# Patient Record
Sex: Female | Born: 1995 | Race: Black or African American | Hispanic: No | Marital: Married | Smoking: Current every day smoker
Health system: Southern US, Community
[De-identification: ages and names within clinical notes are randomized; demographics above are authoritative.]

## PROBLEM LIST (undated history)

## (undated) ENCOUNTER — Inpatient Hospital Stay (HOSPITAL_COMMUNITY): Payer: Self-pay

## (undated) DIAGNOSIS — B009 Herpesviral infection, unspecified: Secondary | ICD-10-CM

## (undated) DIAGNOSIS — A749 Chlamydial infection, unspecified: Secondary | ICD-10-CM

## (undated) DIAGNOSIS — E119 Type 2 diabetes mellitus without complications: Secondary | ICD-10-CM

## (undated) HISTORY — PX: NO PAST SURGERIES: SHX2092

## (undated) HISTORY — DX: Herpesviral infection, unspecified: B00.9

---

## 2016-08-13 ENCOUNTER — Encounter (HOSPITAL_COMMUNITY): Payer: Self-pay

## 2016-08-13 ENCOUNTER — Emergency Department (HOSPITAL_COMMUNITY)
Admission: EM | Admit: 2016-08-13 | Discharge: 2016-08-13 | Disposition: A | Discharge status: Home / Self Care | Payer: Managed Care, Other (non HMO) | Attending: Emergency Medicine | Admitting: Emergency Medicine

## 2016-08-13 ENCOUNTER — Emergency Department (HOSPITAL_COMMUNITY): Payer: Managed Care, Other (non HMO)

## 2016-08-13 DIAGNOSIS — E1165 Type 2 diabetes mellitus with hyperglycemia: Secondary | ICD-10-CM | POA: Diagnosis not present

## 2016-08-13 DIAGNOSIS — R739 Hyperglycemia, unspecified: Secondary | ICD-10-CM

## 2016-08-13 DIAGNOSIS — R0789 Other chest pain: Secondary | ICD-10-CM | POA: Insufficient documentation

## 2016-08-13 HISTORY — DX: Type 2 diabetes mellitus without complications: E11.9

## 2016-08-13 LAB — BASIC METABOLIC PANEL
Anion gap: 10 (ref 5–15)
BUN: 9 mg/dL (ref 6–20)
CO2: 21 mmol/L — ABNORMAL LOW (ref 22–32)
Calcium: 8.8 mg/dL — ABNORMAL LOW (ref 8.9–10.3)
Chloride: 101 mmol/L (ref 101–111)
Creatinine, Ser: 0.79 mg/dL (ref 0.44–1.00)
GFR calc Af Amer: >60 mL/min (ref >60)
GFR calc non Af Amer: >60 mL/min (ref >60)
Glucose, Bld: 407 mg/dL — ABNORMAL HIGH (ref 65–99)
Potassium: 4 mmol/L (ref 3.5–5.1)
Sodium: 132 mmol/L — ABNORMAL LOW (ref 135–145)

## 2016-08-13 LAB — CBC
HCT: 39.4 % (ref 36.0–46.0)
Hemoglobin: 12.9 g/dL (ref 12.0–15.0)
MCH: 28.2 pg (ref 26.0–34.0)
MCHC: 32.7 g/dL (ref 30.0–36.0)
MCV: 86 fL (ref 78.0–100.0)
Platelets: 367 10*3/uL (ref 150–400)
RBC: 4.58 MIL/uL (ref 3.87–5.11)
RDW: 12.1 % (ref 11.5–15.5)
WBC: 7 10*3/uL (ref 4.0–10.5)

## 2016-08-13 LAB — I-STAT TROPONIN, ED: Troponin i, poc: 0.01 ng/mL (ref 0.00–0.08)

## 2016-08-13 LAB — CBG MONITORING, ED: Glucose-Capillary: 123 mg/dL — ABNORMAL HIGH (ref 65–99)

## 2016-08-13 MED ORDER — INSULIN ASPART 100 UNIT/ML ~~LOC~~ SOLN
10.0000 [IU] | Freq: Once | SUBCUTANEOUS | Status: AC
  Administered 2016-08-13: 10 [IU] via INTRAVENOUS
  Filled 2016-08-13: qty 1

## 2016-08-13 MED ORDER — SODIUM CHLORIDE 0.9 % IV BOLUS (SEPSIS)
1000.0000 mL | Freq: Once | INTRAVENOUS | Status: AC
  Administered 2016-08-13: 1000 mL via INTRAVENOUS

## 2016-08-13 NOTE — ED Notes (Addendum)
CBG is 123 

## 2016-08-13 NOTE — ED Triage Notes (Signed)
Pt states that central chest pressure started about an hour ago along with some SOB, denies n/v or radiation.

## 2016-08-13 NOTE — ED Provider Notes (Signed)
[facility]-EMERGENCY DEPT Provider Note   CSN: [id number] Arrival date & time: 08/13/16  0008  By signing my name below, I, Sandrea Hammond, attest that this documentation has been prepared under the direction and in the presence of Gilda Crease, MD. Electronically Signed: Sandrea Hammond, ED Scribe. 08/13/16. 2:04 AM.   History   Chief Complaint Chief Complaint  Patient presents with  . Chest Pain    HPI Comments: Jill Montoya is a 20 y.o. female who presents to the Emergency Department complaining of chest pain that feels like pressure since yesterday. She says she experienced these pain symptoms yesterday and then again tonight. Pt says the pain is exacerbating by laying back in a supine position. She says the pain is alleviated by sitting up into an upright position. She says she has had experienced no previous episodes of this pain before yesterday. Per triage note, pt had a serum glucose of 407 mg/dL on arrival. She says she is noncompliant on her blood sugar medications. Pt denies cough, abdominal pain, vomiting, reflux symptoms, or hx of ulcer or recent illness. Pt says she has no other symptoms or complaints at this time.   The history is provided by the patient and a relative. No language interpreter was used.    Past Medical History:  Diagnosis Date  . Diabetes mellitus without complication (HCC)     There are no active problems to display for this patient.   History reviewed. No pertinent surgical history.  OB History    No data available       Home Medications    Prior to Admission medications   Not on File    Family History No family history on file.  Social History Social History  Substance Use Topics  . Smoking status: Never Smoker  . Smokeless tobacco: Never Used  . Alcohol use No     Allergies   Review of patient's allergies indicates no known allergies.   Review of Systems Review of Systems  Respiratory: Negative for cough.     Cardiovascular: Positive for chest pain.  Gastrointestinal: Negative for abdominal pain and vomiting.  All other systems reviewed and are negative.    Physical Exam Updated Vital Signs BP 109/70 (BP Location: Left Arm)   Pulse 76   Temp 98.6 F (37 C)   Resp 16   Ht 5\' 4"  (1.626 m)   Wt 186 lb (84.4 kg)   SpO2 100%   BMI 31.93 kg/m   Physical Exam  Constitutional: She is oriented to person, place, and time. She appears well-developed and well-nourished. No distress.  HENT:  Head: Normocephalic and atraumatic.  Right Ear: Hearing normal.  Left Ear: Hearing normal.  Nose: Nose normal.  Mouth/Throat: Oropharynx is clear and moist and mucous membranes are normal.  Eyes: Conjunctivae and EOM are normal. Pupils are equal, round, and reactive to light.  Neck: Normal range of motion. Neck supple.  Cardiovascular: Regular rhythm, S1 normal and S2 normal.  Exam reveals no gallop and no friction rub.   No murmur heard. Pulmonary/Chest: Effort normal and breath sounds normal. No respiratory distress. She exhibits no tenderness.  Abdominal: Soft. Normal appearance and bowel sounds are normal. There is no hepatosplenomegaly. There is no tenderness. There is no rebound, no guarding, no tenderness at McBurney's point and negative Murphy's sign. No hernia.  Musculoskeletal: Normal range of motion.  Neurological: She is alert and oriented to person, place, and time. She has normal strength. No cranial nerve deficit or  sensory deficit. Coordination normal. GCS eye subscore is 4. GCS verbal subscore is 5. GCS motor subscore is 6.  Skin: Skin is warm, dry and intact. No rash noted. No cyanosis.  Psychiatric: She has a normal mood and affect. Her speech is normal and behavior is normal. Thought content normal.  Nursing note and vitals reviewed.    ED Treatments / Results   DIAGNOSTIC STUDIES: Oxygen Saturation is 98% on RA, adequate by my interpretation.    COORDINATION OF CARE: 1:53 AM  Discussed treatment plan with pt at bedside and pt agreed to plan. Long-term risks associated with uncontrolled blood sugars were discussed.   Labs (all labs ordered are listed, but only abnormal results are displayed) Labs Reviewed  BASIC METABOLIC PANEL - Abnormal; Notable for the following:       Result Value   Sodium 132 (*)    CO2 21 (*)    Glucose, Bld 407 (*)    Calcium 8.8 (*)    All other components within normal limits  CBG MONITORING, ED - Abnormal; Notable for the following:    Glucose-Capillary 123 (*)    All other components within normal limits  CBC  I-STAT TROPOININ, ED    EKG  EKG Interpretation None       Radiology Dg Chest 2 View  Result Date: 08/13/2016 CLINICAL DATA:  20 year old female with chest pain EXAM: CHEST  2 VIEW COMPARISON:  None. FINDINGS: The heart size and mediastinal contours are within normal limits. Both lungs are clear. The visualized skeletal structures are unremarkable. IMPRESSION: No active cardiopulmonary disease. Electronically Signed   By: Elgie Collard M.D.   On: 08/13/2016 02:06    Procedures Procedures (including critical care time)  Medications Ordered in ED Medications  sodium chloride 0.9 % bolus 1,000 mL (0 mLs Intravenous Stopped 08/13/16 0310)  insulin aspart (novoLOG) injection 10 Units (10 Units Intravenous Given 08/13/16 0226)     Initial Impression / Assessment and Plan / ED Course  I have reviewed the triage vital signs and the nursing notes.  Pertinent labs & imaging results that were available during my care of the patient were reviewed by me and considered in my medical decision making (see chart for details).  Clinical Course    Patient presented to the emergency department with complaints of chest pain. Patient reports that she's been having a heaviness across her upper chest since yesterday. Patient reports that the symptoms were present when she lies flat to go away when she sits up. She does not have  any cardiac risk factors other than diabetes. Symptoms are extremely atypical. They have been present throughout the course of the day and there are no EKG changes and troponin was negative.  Patient was found to be very hyperglycemic. She admits that she has not been taking her diabetes medications regularly. Bicarbonate was slightly low at 21 but she does not have an anion gap. Patient appears well. She was hydrated with IV fluids and administer insulin. Recheck of blood sugar is now normal. No concern for DKA, appropriate for discharge. Patient counseled that she needs to take better control of her diabetes going forward.  Final Clinical Impressions(s) / ED Diagnoses   Final diagnoses:  Atypical chest pain  Hyperglycemia    New Prescriptions New Prescriptions   No medications on file   I personally performed the services described in this documentation, which was scribed in my presence. The recorded information has been reviewed and is accurate.  Gilda Creasehristopher J Hazel Leveille, MD 08/13/16 [phone]

## 2016-08-13 NOTE — ED Notes (Signed)
MD at bedside. 

## 2017-01-14 ENCOUNTER — Encounter (HOSPITAL_COMMUNITY): Payer: Self-pay

## 2017-01-14 DIAGNOSIS — J111 Influenza due to unidentified influenza virus with other respiratory manifestations: Secondary | ICD-10-CM | POA: Insufficient documentation

## 2017-01-14 DIAGNOSIS — E119 Type 2 diabetes mellitus without complications: Secondary | ICD-10-CM | POA: Insufficient documentation

## 2017-01-14 DIAGNOSIS — R05 Cough: Secondary | ICD-10-CM | POA: Diagnosis present

## 2017-01-14 NOTE — ED Triage Notes (Addendum)
Pt reports body aches, chills, fever, dry cough, and nausea. She went to urgent care two days ago bu they ran out of flu swabs but gave her Tamiflu but she still feels bad. She also reports nausea and vomiting. She also reports having abdominal pain earlier today but not right now.

## 2017-01-14 NOTE — ED Notes (Signed)
Pts acuity increased to "3" due to HR of 115

## 2017-01-15 ENCOUNTER — Emergency Department (HOSPITAL_COMMUNITY)
Admission: EM | Admit: 2017-01-15 | Discharge: 2017-01-15 | Disposition: A | Discharge status: Home / Self Care | Payer: 59 | Attending: Emergency Medicine | Admitting: Emergency Medicine

## 2017-01-15 ENCOUNTER — Emergency Department (HOSPITAL_COMMUNITY): Payer: 59

## 2017-01-15 DIAGNOSIS — R69 Illness, unspecified: Secondary | ICD-10-CM

## 2017-01-15 DIAGNOSIS — J111 Influenza due to unidentified influenza virus with other respiratory manifestations: Secondary | ICD-10-CM

## 2017-01-15 LAB — COMPREHENSIVE METABOLIC PANEL
ALT: 16 U/L (ref 14–54)
AST: 17 U/L (ref 15–41)
Albumin: 3.8 g/dL (ref 3.5–5.0)
Alkaline Phosphatase: 58 U/L (ref 38–126)
Anion gap: 10 (ref 5–15)
BUN: 7 mg/dL (ref 6–20)
CO2: 25 mmol/L (ref 22–32)
Calcium: 8.8 mg/dL — ABNORMAL LOW (ref 8.9–10.3)
Chloride: 101 mmol/L (ref 101–111)
Creatinine, Ser: 0.79 mg/dL (ref 0.44–1.00)
GFR calc Af Amer: >60 mL/min (ref >60)
GFR calc non Af Amer: >60 mL/min (ref >60)
Glucose, Bld: 175 mg/dL — ABNORMAL HIGH (ref 65–99)
Potassium: 3.5 mmol/L (ref 3.5–5.1)
Sodium: 136 mmol/L (ref 135–145)
Total Bilirubin: 1.2 mg/dL (ref 0.3–1.2)
Total Protein: 6.8 g/dL (ref 6.5–8.1)

## 2017-01-15 LAB — CBC WITH DIFFERENTIAL/PLATELET
Basophils Absolute: 0 10*3/uL (ref 0.0–0.1)
Basophils Relative: 0 %
Eosinophils Absolute: 0 10*3/uL (ref 0.0–0.7)
Eosinophils Relative: 1 %
HCT: 39.1 % (ref 36.0–46.0)
Hemoglobin: 13.1 g/dL (ref 12.0–15.0)
Lymphocytes Relative: 33 %
Lymphs Abs: 1.2 10*3/uL (ref 0.7–4.0)
MCH: 28.7 pg (ref 26.0–34.0)
MCHC: 33.5 g/dL (ref 30.0–36.0)
MCV: 85.7 fL (ref 78.0–100.0)
Monocytes Absolute: 0.5 10*3/uL (ref 0.1–1.0)
Monocytes Relative: 14 %
Neutro Abs: 1.8 10*3/uL (ref 1.7–7.7)
Neutrophils Relative %: 52 %
Platelets: 261 10*3/uL (ref 150–400)
RBC: 4.56 MIL/uL (ref 3.87–5.11)
RDW: 12.3 % (ref 11.5–15.5)
WBC: 3.5 10*3/uL — ABNORMAL LOW (ref 4.0–10.5)

## 2017-01-15 LAB — I-STAT BETA HCG BLOOD, ED (MC, WL, AP ONLY): I-stat hCG, quantitative: <5 m[IU]/mL (ref <5)

## 2017-01-15 LAB — LIPASE, BLOOD: Lipase: 27 U/L (ref 11–51)

## 2017-01-15 LAB — I-STAT CG4 LACTIC ACID, ED: Lactic Acid, Venous: 0.91 mmol/L (ref 0.5–1.9)

## 2017-01-15 MED ORDER — SODIUM CHLORIDE 0.9 % IV BOLUS (SEPSIS): 2000.0000 mL | Freq: Once | INTRAVENOUS | Status: DC

## 2017-01-15 MED ORDER — SODIUM CHLORIDE 0.9 % IV BOLUS (SEPSIS)
2000.0000 mL | Freq: Once | INTRAVENOUS | Status: AC
  Administered 2017-01-15: 2000 mL via INTRAVENOUS

## 2017-01-15 MED ORDER — BENZONATATE 100 MG PO CAPS: 100.0000 mg | ORAL_CAPSULE | Freq: Three times a day (TID) | ORAL | 0 refills | Status: DC

## 2017-01-15 MED ORDER — ONDANSETRON HCL 4 MG/2ML IJ SOLN
4.0000 mg | Freq: Once | INTRAMUSCULAR | Status: AC
  Administered 2017-01-15: 4 mg via INTRAVENOUS
  Filled 2017-01-15: qty 2

## 2017-01-15 MED ORDER — KETOROLAC TROMETHAMINE 30 MG/ML IJ SOLN
30.0000 mg | Freq: Once | INTRAMUSCULAR | Status: AC
  Administered 2017-01-15: 30 mg via INTRAVENOUS
  Filled 2017-01-15: qty 1

## 2017-01-15 NOTE — Discharge Instructions (Signed)
Return to the emergency room for any worsening or concerning symptoms including fast breathing, heart racing, confusion, vomiting. ° °Rest, cover your mouth when you cough and wash your hands frequently.  ° °Push fluids: water or Gatorade, do not drink any soda, juice or caffeinated beverages. ° °For fever and pain control you can take Motrin (ibuprofen) as follows: 400 mg (this is normally 2 over the counter pills) every 4 hours with food. ° °Do not return to work until a day after your fever breaks.  ° ° ° °

## 2017-01-15 NOTE — ED Provider Notes (Signed)
[facility] Provider Note   CSN: [id number] Arrival date & time: 01/14/17  2123     History   Chief Complaint Chief Complaint  Patient presents with  . Generalized Body Aches     HPI Blood pressure 112/85, pulse 115, temperature 99.8 F (37.7 C), temperature source Oral, resp. rate 18, SpO2 100 %.  Jill Montoya is a 21 y.o. female with past medical history significant for non-insulin-dependent diabetes complaining of productive cough, myalgia, fever that has been difficult to control with associated nausea and vomiting, no diarrhea or decreased urinary output. She was seen at her primary care office and diagnosed with clinical flu and pneumonia. She's was started on Tamiflu and azithromycin 2 days ago and states that she does not feel better. She's been taking Robitussin and acetaminophen at home with little relief.  Past Medical History:  Diagnosis Date  . Diabetes mellitus without complication (HCC)     There are no active problems to display for this patient.   History reviewed. No pertinent surgical history.  OB History    No data available       Home Medications    Prior to Admission medications   Medication Sig Start Date End Date Taking? Authorizing Provider  benzonatate (TESSALON) 100 MG capsule Take 1 capsule (100 mg total) by mouth every 8 (eight) hours. 01/15/17   Joni Reining Pema Thomure, PA-C    Family History No family history on file.  Social History Social History  Substance Use Topics  . Smoking status: Never Smoker  . Smokeless tobacco: Never Used  . Alcohol use No     Allergies   Nasonex [mometasone furoate]   Review of Systems Review of Systems  10 systems reviewed and found to be negative, except as noted in the HPI.  Physical Exam Updated Vital Signs BP 115/68   Pulse 96   Temp 99.1 F (37.3 C) (Oral)   Resp 14   Ht 5\' 4"  (1.626 m)   Wt 83.3 kg   SpO2 99%   BMI 31.51 kg/m   Physical Exam  Constitutional: She is  oriented to person, place, and time. She appears well-developed and well-nourished. No distress.  HENT:  Head: Normocephalic and atraumatic.  Right Ear: External ear normal.  Left Ear: External ear normal.  Mouth/Throat: Oropharynx is clear and moist. No oropharyngeal exudate.  No drooling or stridor. Posterior pharynx mildly erythematous no significant tonsillar hypertrophy. No exudate. Soft palate rises symmetrically. No TTP or induration under tongue.   No tenderness to palpation of frontal or bilateral maxillary sinuses.  Mild mucosal edema in the nares with scant rhinorrhea.  Bilateral tympanic membranes with normal architecture and good light reflex.    Eyes: Conjunctivae and EOM are normal. Pupils are equal, round, and reactive to light.  Neck: Normal range of motion. Neck supple.  Cardiovascular: Normal rate, regular rhythm and intact distal pulses.   Pulmonary/Chest: Effort normal and breath sounds normal. No stridor. No respiratory distress. She has no wheezes. She has no rales. She exhibits no tenderness.  Abdominal: Soft. She exhibits no distension and no mass. There is no tenderness. There is no rebound and no guarding. No hernia.  Musculoskeletal: Normal range of motion.  Neurological: She is alert and oriented to person, place, and time.  Skin: Capillary refill takes less than 2 seconds. She is not diaphoretic.  Psychiatric: She has a normal mood and affect.  Nursing note and vitals reviewed.    ED Treatments / Results  Labs (all  labs ordered are listed, but only abnormal results are displayed) Labs Reviewed  CBC WITH DIFFERENTIAL/PLATELET - Abnormal; Notable for the following:       Result Value   WBC 3.5 (*)    All other components within normal limits  COMPREHENSIVE METABOLIC PANEL - Abnormal; Notable for the following:    Glucose, Bld 175 (*)    Calcium 8.8 (*)    All other components within normal limits  LIPASE, BLOOD  I-STAT CG4 LACTIC ACID, ED  I-STAT  BETA HCG BLOOD, ED ([facility], [facility], [facility] ONLY)  I-STAT CG4 LACTIC ACID, ED    EKG  EKG Interpretation None       Radiology Dg Chest 2 View  Result Date: 01/15/2017 CLINICAL DATA:  Fever, chills, nausea, vomiting, dizziness, cough, congestion for 4 days. History of diabetes. EXAM: CHEST  2 VIEW COMPARISON:  Chest radiograph August 13, 2016 FINDINGS: Cardiomediastinal silhouette is normal. No pleural effusions or focal consolidations. Trachea projects midline and there is no pneumothorax. Soft tissue planes and included osseous structures are non-suspicious. IMPRESSION: Normal chest. Electronically Signed   By: Awilda Metroourtnay  Bloomer M.D.   On: 01/15/2017 02:27    Procedures Procedures (including critical care time)  Medications Ordered in ED Medications  sodium chloride 0.9 % bolus 2,000 mL (not administered)  sodium chloride 0.9 % bolus 2,000 mL (0 mLs Intravenous Stopped 01/15/17 0448)  ondansetron (ZOFRAN) injection 4 mg (4 mg Intravenous Given 01/15/17 0216)  ketorolac (TORADOL) 30 MG/ML injection 30 mg (30 mg Intravenous Given 01/15/17 0330)     Initial Impression / Assessment and Plan / ED Course  I have reviewed the triage vital signs and the nursing notes.  Pertinent labs & imaging results that were available during my care of the patient were reviewed by me and considered in my medical decision making (see chart for details).     Vitals:   01/15/17 0400 01/15/17 0415 01/15/17 0427 01/15/17 0430  BP: 115/73 115/77  115/68  Pulse: 111 92  96  Resp: 11 25  14   Temp:   99.1 F (37.3 C)   TempSrc:   Oral   SpO2: 99% 98%  99%  Weight:      Height:        Medications  sodium chloride 0.9 % bolus 2,000 mL (not administered)  sodium chloride 0.9 % bolus 2,000 mL (0 mLs Intravenous Stopped 01/15/17 0448)  ondansetron (ZOFRAN) injection 4 mg (4 mg Intravenous Given 01/15/17 0216)  ketorolac (TORADOL) 30 MG/ML injection 30 mg (30 mg Intravenous Given 01/15/17 0330)    Jill Montoya is 21  y.o. female non-insulin-dependent diabetic recently diagnosed with both son pneumonia and flu she been feeling ill for approximately 5 days. States that she feels that the Tamiflu and azithromycin are helpful to her, initially patient is quite tachycardic and blood pressure is soft with systolic in the mid 80s. Patient is overall well appearing, will obtain chest x-ray, check basic blood work and give to liter bolus and reassess.  Blood work and chest x-ray reassuring. Tachycardia has resolved, she feels much better after hydration and Toradol. Advised her to continue taking her Tamiflu and check in with primary care for recheck in one week.  Evaluation does not show pathology that would require ongoing emergent intervention or inpatient treatment. Pt is hemodynamically stable and mentating appropriately. Discussed findings and plan with patient/guardian, who agrees with care plan. All questions answered. Return precautions discussed and outpatient follow up given.    Final Clinical  Impressions(s) / ED Diagnoses   Final diagnoses:  Influenza-like illness    New Prescriptions New Prescriptions   BENZONATATE (TESSALON) 100 MG CAPSULE    Take 1 capsule (100 mg total) by mouth every 8 (eight) hours.     Wynetta Emery, PA-C 01/15/17 0452    Layla Maw Ward, DO 01/15/17 [id number]

## 2017-01-15 NOTE — ED Notes (Signed)
Patient transported to X-ray 

## 2017-09-23 DIAGNOSIS — Z3009 Encounter for other general counseling and advice on contraception: Secondary | ICD-10-CM | POA: Diagnosis not present

## 2017-09-23 DIAGNOSIS — E109 Type 1 diabetes mellitus without complications: Secondary | ICD-10-CM | POA: Diagnosis not present

## 2017-09-23 DIAGNOSIS — N760 Acute vaginitis: Secondary | ICD-10-CM | POA: Diagnosis not present

## 2017-09-23 DIAGNOSIS — Z124 Encounter for screening for malignant neoplasm of cervix: Secondary | ICD-10-CM | POA: Diagnosis not present

## 2017-09-30 DIAGNOSIS — Z6829 Body mass index (BMI) 29.0-29.9, adult: Secondary | ICD-10-CM | POA: Diagnosis not present

## 2017-09-30 DIAGNOSIS — E1165 Type 2 diabetes mellitus with hyperglycemia: Secondary | ICD-10-CM | POA: Diagnosis not present

## 2017-10-27 DIAGNOSIS — H17822 Peripheral opacity of cornea, left eye: Secondary | ICD-10-CM | POA: Diagnosis not present

## 2017-10-27 DIAGNOSIS — H16142 Punctate keratitis, left eye: Secondary | ICD-10-CM | POA: Diagnosis not present

## 2017-11-16 NOTE — L&D Delivery Note (Signed)
Delivery Note At 7:03 PM a viable female was delivered via Vaginal, Spontaneous (Presentation: left occiput anterior ;  ).  APGAR: 9, 9; weight 6 lb 8.8 oz (2970 g).   Placenta status: complete 3 vessel , .  Cord:  with the following complications: none.  Cord pH: NA  Anesthesia: epidural   Episiotomy: None Lacerations: 2nd degree;Perineal Suture Repair: 3.0 vicryl Est. Blood Loss (mL): 50  Mom to postpartum.  Baby to NICU.  [name] J. 07/09/2018, 11:35 PM

## 2017-12-19 ENCOUNTER — Emergency Department (HOSPITAL_COMMUNITY)
Admission: EM | Admit: 2017-12-19 | Discharge: 2017-12-19 | Disposition: A | Discharge status: Home / Self Care | Payer: 59 | Attending: Emergency Medicine | Admitting: Emergency Medicine

## 2017-12-19 ENCOUNTER — Emergency Department (HOSPITAL_COMMUNITY): Payer: 59

## 2017-12-19 ENCOUNTER — Other Ambulatory Visit: Payer: Self-pay

## 2017-12-19 ENCOUNTER — Encounter (HOSPITAL_COMMUNITY): Payer: Self-pay

## 2017-12-19 DIAGNOSIS — Z3A01 Less than 8 weeks gestation of pregnancy: Secondary | ICD-10-CM | POA: Insufficient documentation

## 2017-12-19 DIAGNOSIS — O9989 Other specified diseases and conditions complicating pregnancy, childbirth and the puerperium: Secondary | ICD-10-CM | POA: Diagnosis present

## 2017-12-19 DIAGNOSIS — R102 Pelvic and perineal pain: Secondary | ICD-10-CM | POA: Insufficient documentation

## 2017-12-19 DIAGNOSIS — R1030 Lower abdominal pain, unspecified: Secondary | ICD-10-CM | POA: Diagnosis not present

## 2017-12-19 DIAGNOSIS — O26891 Other specified pregnancy related conditions, first trimester: Secondary | ICD-10-CM | POA: Diagnosis not present

## 2017-12-19 DIAGNOSIS — O208 Other hemorrhage in early pregnancy: Secondary | ICD-10-CM | POA: Diagnosis not present

## 2017-12-19 DIAGNOSIS — E119 Type 2 diabetes mellitus without complications: Secondary | ICD-10-CM | POA: Insufficient documentation

## 2017-12-19 DIAGNOSIS — R1084 Generalized abdominal pain: Secondary | ICD-10-CM

## 2017-12-19 LAB — COMPREHENSIVE METABOLIC PANEL
ALT: 12 U/L — ABNORMAL LOW (ref 14–54)
AST: 18 U/L (ref 15–41)
Albumin: 3.7 g/dL (ref 3.5–5.0)
Alkaline Phosphatase: 54 U/L (ref 38–126)
Anion gap: 9 (ref 5–15)
BUN: 9 mg/dL (ref 6–20)
CO2: 20 mmol/L — ABNORMAL LOW (ref 22–32)
Calcium: 8.6 mg/dL — ABNORMAL LOW (ref 8.9–10.3)
Chloride: 104 mmol/L (ref 101–111)
Creatinine, Ser: 0.78 mg/dL (ref 0.44–1.00)
GFR calc Af Amer: >60 mL/min (ref >60)
GFR calc non Af Amer: >60 mL/min (ref >60)
Glucose, Bld: 190 mg/dL — ABNORMAL HIGH (ref 65–99)
Potassium: 4.3 mmol/L (ref 3.5–5.1)
Sodium: 133 mmol/L — ABNORMAL LOW (ref 135–145)
Total Bilirubin: 0.6 mg/dL (ref 0.3–1.2)
Total Protein: 6.3 g/dL — ABNORMAL LOW (ref 6.5–8.1)

## 2017-12-19 LAB — URINALYSIS, ROUTINE W REFLEX MICROSCOPIC
Bilirubin Urine: NEGATIVE
Glucose, UA: >=500 mg/dL — AB
Hgb urine dipstick: NEGATIVE
Ketones, ur: NEGATIVE mg/dL
Leukocytes, UA: NEGATIVE
Nitrite: NEGATIVE
Protein, ur: NEGATIVE mg/dL
Specific Gravity, Urine: 1.018 (ref 1.005–1.030)
pH: 7 (ref 5.0–8.0)

## 2017-12-19 LAB — HCG, QUANTITATIVE, PREGNANCY: hCG, Beta Chain, Quant, S: 6765 m[IU]/mL — ABNORMAL HIGH (ref <5)

## 2017-12-19 LAB — LIPASE, BLOOD: Lipase: 28 U/L (ref 11–51)

## 2017-12-19 LAB — CBC
HCT: 36.8 % (ref 36.0–46.0)
Hemoglobin: 12.4 g/dL (ref 12.0–15.0)
MCH: 29.5 pg (ref 26.0–34.0)
MCHC: 33.7 g/dL (ref 30.0–36.0)
MCV: 87.4 fL (ref 78.0–100.0)
Platelets: 346 10*3/uL (ref 150–400)
RBC: 4.21 MIL/uL (ref 3.87–5.11)
RDW: 12.6 % (ref 11.5–15.5)
WBC: 6.2 10*3/uL (ref 4.0–10.5)

## 2017-12-19 LAB — I-STAT BETA HCG BLOOD, ED (MC, WL, AP ONLY): I-stat hCG, quantitative: >2000 m[IU]/mL — ABNORMAL HIGH (ref <5)

## 2017-12-19 LAB — HCG, SERUM, QUALITATIVE: Preg, Serum: POSITIVE — AB

## 2017-12-19 NOTE — ED Notes (Signed)
Patient transported to Ultrasound 

## 2017-12-19 NOTE — ED Triage Notes (Signed)
Patient complains of generalized abdominal discomfort and frequent urination x 1 day. No discharge, no bleeding. Reports that she is [redacted] weeks pregnant, G1

## 2017-12-19 NOTE — ED Provider Notes (Signed)
[facility] EMERGENCY DEPARTMENT Provider Note   CSN: [id number] Arrival date & time: 12/19/17  1141     History   Chief Complaint No chief complaint on file.   HPI Jill Montoya is a G30P0 22 y.o. female who is currently [redacted] weeks pregnant  (LMP 11/15/17) with a history of diabetes who presents emergency department today for abdominal pain. Patient notes that yesterday evening she had some crampy abdominal pains in the suprapubic area that lasted for approximately 1 hour.  She also notes she had intermittent episodes of sharp lower quadrant pain that lasted for approximately 30 seconds.  She contacted her OB who told this was likely normal pain.  She states she is very nervous that this is her first pregnancy.  Pain is not recurred.  She does not she had some epigastric pressure this morning that lasted approximately 1 hour and describes this as dull and achy.  She has not taken anything for her symptoms.  Nothing makes her symptoms better or worse.  She is currently without any abdominal pain.  Denies any fever, chills, nausea/vomiting, vaginal discharge, vaginal bleeding, pelvic pain.  Patient does note some increased urinary frequency but notes this is due to a new diabetic medication she recently started.  She denies any hematuria, urgency, dysuria, flank pain.  HPI  Past Medical History:  Diagnosis Date  . Diabetes mellitus without complication (HCC)     There are no active problems to display for this patient.   History reviewed. No pertinent surgical history.  OB History    Gravida Para Term Preterm AB Living   1             SAB TAB Ectopic Multiple Live Births                   Home Medications    Prior to Admission medications   Medication Sig Start Date End Date Taking? Authorizing Provider  benzonatate (TESSALON) 100 MG capsule Take 1 capsule (100 mg total) by mouth every 8 (eight) hours. 01/15/17   Pisciotta, Mardella Layman    Family History No  family history on file.  Social History Social History   Tobacco Use  . Smoking status: Never Smoker  . Smokeless tobacco: Never Used  Substance Use Topics  . Alcohol use: No  . Drug use: Not on file     Allergies   Nasonex [mometasone furoate]   Review of Systems Review of Systems  All other systems reviewed and are negative.    Physical Exam Updated Vital Signs BP 120/66 (BP Location: Right Arm)   Pulse 87   Temp 99.2 F (37.3 C) (Oral)   Resp 12   Ht 5\' 4"  (1.626 m)   Wt 80.3 kg (177 lb)   LMP 11/15/2017   SpO2 99%   BMI 30.38 kg/m   Physical Exam  Constitutional: She appears well-developed and well-nourished.  HENT:  Head: Normocephalic and atraumatic.  Right Ear: External ear normal.  Left Ear: External ear normal.  Nose: Nose normal.  Mouth/Throat: Uvula is midline, oropharynx is clear and moist and mucous membranes are normal. No tonsillar exudate.  Eyes: Pupils are equal, round, and reactive to light. Right eye exhibits no discharge. Left eye exhibits no discharge. No scleral icterus.  Neck: Trachea normal. Neck supple. No spinous process tenderness present. No neck rigidity. Normal range of motion present.  Cardiovascular: Normal rate, regular rhythm and intact distal pulses.  No murmur heard. Pulses:  Radial pulses are 2+ on the right side, and 2+ on the left side.       Dorsalis pedis pulses are 2+ on the right side, and 2+ on the left side.       Posterior tibial pulses are 2+ on the right side, and 2+ on the left side.  No lower extremity swelling or edema. Calves symmetric in size bilaterally.  Pulmonary/Chest: Effort normal and breath sounds normal. She exhibits no tenderness.  Abdominal: Soft. Bowel sounds are normal. She exhibits no distension. There is no tenderness. There is no rigidity, no rebound, no guarding and no CVA tenderness.  Mildly gravid abdomen.  Musculoskeletal: She exhibits no edema.  Lymphadenopathy:    She has no  cervical adenopathy.  Neurological: She is alert.  Skin: Skin is warm and dry. No rash noted. She is not diaphoretic.  Psychiatric: She has a normal mood and affect.  Nursing note and vitals reviewed.    ED Treatments / Results  Labs (all labs ordered are listed, but only abnormal results are displayed) Labs Reviewed  COMPREHENSIVE METABOLIC PANEL - Abnormal; Notable for the following components:      Result Value   Sodium 133 (*)    CO2 20 (*)    Glucose, Bld 190 (*)    Calcium 8.6 (*)    Total Protein 6.3 (*)    ALT 12 (*)    All other components within normal limits  URINALYSIS, ROUTINE W REFLEX MICROSCOPIC - Abnormal; Notable for the following components:   Glucose, UA >=500 (*)    Bacteria, UA RARE (*)    Squamous Epithelial / LPF 0-5 (*)    All other components within normal limits  I-STAT BETA HCG BLOOD, ED ([facility], [facility], [facility] ONLY) - Abnormal; Notable for the following components:   I-stat hCG, quantitative >2,000.0 (*)    All other components within normal limits  LIPASE, BLOOD  CBC  HCG, QUANTITATIVE, PREGNANCY    EKG  EKG Interpretation None       Radiology US Ob Comp < 14 Wks  Result Date: 12/19/2017 CLINICAL DATA:  Pelvic pain x2 days, pregnant EXAM: OBSTETRIC <14 WK [place] AND TRANSVAGINAL OB US TECHNIQUE: Both transabdominal and transvaginal ultrasound examinations were performed for complete evaluation of the gestation as well as the maternal uterus, adnexal regions, and pelvic cul-de-sac. Transvaginal technique was performed to assess early pregnancy. COMPARISON:  None. FINDINGS: Intrauterine gestational sac: Single Yolk sac:  Visualized. Embryo:  Not Visualized. MSD: 7.5 mm   5 w   3  d Subchorionic hemorrhage:  Small subchronic hemorrhage. Maternal uterus/adnexae: Right ovary is within normal limits. Left ovary is notable for a corpus luteal cyst. Small volume pelvic ascites. IMPRESSION: Single intrauterine gestational sac with yolk sac, measuring 5 weeks 3 days by  mean sac diameter. No definite fetal pole is visualized. Consider follow-up pelvic ultrasound in 14 days to document viability, as clinically warranted. Electronically Signed   By: Charline Bills M.D.   On: 12/19/2017 15:14   US Ob Transvaginal  Result Date: 12/19/2017 CLINICAL DATA:  Pelvic pain x2 days, pregnant EXAM: OBSTETRIC <14 WK [place] AND TRANSVAGINAL OB US TECHNIQUE: Both transabdominal and transvaginal ultrasound examinations were performed for complete evaluation of the gestation as well as the maternal uterus, adnexal regions, and pelvic cul-de-sac. Transvaginal technique was performed to assess early pregnancy. COMPARISON:  None. FINDINGS: Intrauterine gestational sac: Single Yolk sac:  Visualized. Embryo:  Not Visualized. MSD: 7.5 mm   5 w  3  d Subchorionic hemorrhage:  Small subchronic hemorrhage. Maternal uterus/adnexae: Right ovary is within normal limits. Left ovary is notable for a corpus luteal cyst. Small volume pelvic ascites. IMPRESSION: Single intrauterine gestational sac with yolk sac, measuring 5 weeks 3 days by mean sac diameter. No definite fetal pole is visualized. Consider follow-up pelvic ultrasound in 14 days to document viability, as clinically warranted. Electronically Signed   By: Charline BillsSriyesh  Krishnan M.D.   On: 12/19/2017 15:14    Procedures Procedures (including critical care time)  Medications Ordered in ED Medications - No data to display   Initial Impression / Assessment and Plan / ED Course  I have reviewed the triage vital signs and the nursing notes.  Pertinent labs & imaging results that were available during my care of the patient were reviewed by me and considered in my medical decision making (see chart for details).     Is a 22 year old G1 P0 female who is currently [redacted] weeks pregnant presenting with abdominal pain that began yesterday.  This is been intermittent and changing in location since onset.  There is no associated fever, chills, nausea,  emesis, vaginal bleeding, vaginal discharge or pelvic pain.  Patient is currently without any abdominal pain.  Abdomen is soft, nondistended and without tenderness palpation or peritoneal signs.  She does report some increased urinary frequency but denies any dysuria, urgency, or hematuria.  UA is without evidence of infection.  Labs otherwise reassuring.  No leukocytosis.  Lipase within normal limits.  No anemia.  Will order transvaginal ultrasound to evaluate pain. Will order a quantitative hCG.   Ultrasound shows single intrauterine gestational sac with yolk sac, measuring 5 weeks 3 days.  Do not suspect ectopic pregnancy at this time.  The evaluation does not show pathology that would require ongoing emergent intervention or inpatient treatment.  Will have patient follow-up with OB/GYN in 2 days for repeat hCG testing and reevaluation. I advised the patient to return to the emergency department with new or worsening symptoms or new concerns. Specific return precautions discussed. The patient verbalized understanding and agreement with plan. All questions answered. No further questions at this time. The patient is hemodynamically stable, mentating appropriately and appears safe for discharge.  Patient case discussed with Dr. Fayrene FearingJames who is in agreement with plan.  Final Clinical Impressions(s) / ED Diagnoses   Final diagnoses:  Less than [redacted] weeks gestation of pregnancy  Generalized abdominal pain    ED Discharge Orders    None       Princella PellegriniMaczis, Hayden Kihara M, PA-C 12/19/17 1555    Rolland PorterJames, Mark, MD 12/20/17 2157

## 2017-12-19 NOTE — Discharge Instructions (Addendum)
Your lab testing and ultrasound were reassuring today.  You will need to follow-up with your OB/GYN 2 days for repeat blood testing (HcG).  If you  develop any increasing abdominal pain, vaginal bleeding or vaginal discharge you can return to the emergency department for further evaluation. If you develop worsening or new concerning symptoms you can return to the emergency department for re-evaluation.

## 2017-12-21 DIAGNOSIS — O200 Threatened abortion: Secondary | ICD-10-CM | POA: Diagnosis not present

## 2018-01-03 DIAGNOSIS — Z3400 Encounter for supervision of normal first pregnancy, unspecified trimester: Secondary | ICD-10-CM | POA: Diagnosis not present

## 2018-01-03 DIAGNOSIS — Z3401 Encounter for supervision of normal first pregnancy, first trimester: Secondary | ICD-10-CM | POA: Diagnosis not present

## 2018-01-03 LAB — OB RESULTS CONSOLE RPR: RPR: NONREACTIVE

## 2018-01-03 LAB — OB RESULTS CONSOLE HIV ANTIBODY (ROUTINE TESTING): HIV: NONREACTIVE

## 2018-01-03 LAB — OB RESULTS CONSOLE ABO/RH: RH Type: POSITIVE

## 2018-01-03 LAB — OB RESULTS CONSOLE RUBELLA ANTIBODY, IGM: Rubella: IMMUNE

## 2018-01-03 LAB — OB RESULTS CONSOLE HEPATITIS B SURFACE ANTIGEN: Hepatitis B Surface Ag: NEGATIVE

## 2018-02-01 DIAGNOSIS — O24011 Pre-existing diabetes mellitus, type 1, in pregnancy, first trimester: Secondary | ICD-10-CM | POA: Diagnosis not present

## 2018-02-02 DIAGNOSIS — Z3687 Encounter for antenatal screening for uncertain dates: Secondary | ICD-10-CM | POA: Diagnosis not present

## 2018-02-02 DIAGNOSIS — Z3A11 11 weeks gestation of pregnancy: Secondary | ICD-10-CM | POA: Diagnosis not present

## 2018-02-25 DIAGNOSIS — Z8619 Personal history of other infectious and parasitic diseases: Secondary | ICD-10-CM | POA: Diagnosis not present

## 2018-03-24 DIAGNOSIS — O0992 Supervision of high risk pregnancy, unspecified, second trimester: Secondary | ICD-10-CM | POA: Diagnosis not present

## 2018-03-24 DIAGNOSIS — O24112 Pre-existing diabetes mellitus, type 2, in pregnancy, second trimester: Secondary | ICD-10-CM | POA: Diagnosis not present

## 2018-03-25 ENCOUNTER — Other Ambulatory Visit (HOSPITAL_COMMUNITY): Payer: Self-pay | Admitting: Obstetrics & Gynecology

## 2018-03-25 DIAGNOSIS — O24112 Pre-existing diabetes mellitus, type 2, in pregnancy, second trimester: Secondary | ICD-10-CM

## 2018-03-25 DIAGNOSIS — Z3689 Encounter for other specified antenatal screening: Secondary | ICD-10-CM

## 2018-03-25 DIAGNOSIS — Z3A20 20 weeks gestation of pregnancy: Secondary | ICD-10-CM

## 2018-04-06 ENCOUNTER — Encounter (HOSPITAL_COMMUNITY): Payer: Self-pay | Admitting: *Deleted

## 2018-04-07 ENCOUNTER — Ambulatory Visit (HOSPITAL_COMMUNITY)
Admission: RE | Admit: 2018-04-07 | Discharge: 2018-04-07 | Disposition: A | Discharge status: Home / Self Care | Payer: 59 | Source: Ambulatory Visit | Attending: Obstetrics & Gynecology | Admitting: Obstetrics & Gynecology

## 2018-04-07 ENCOUNTER — Other Ambulatory Visit (HOSPITAL_COMMUNITY): Payer: Self-pay | Admitting: *Deleted

## 2018-04-07 ENCOUNTER — Encounter (HOSPITAL_COMMUNITY): Payer: Self-pay

## 2018-04-07 DIAGNOSIS — Z363 Encounter for antenatal screening for malformations: Secondary | ICD-10-CM | POA: Insufficient documentation

## 2018-04-07 DIAGNOSIS — O99212 Obesity complicating pregnancy, second trimester: Secondary | ICD-10-CM | POA: Insufficient documentation

## 2018-04-07 DIAGNOSIS — Z3A20 20 weeks gestation of pregnancy: Secondary | ICD-10-CM | POA: Diagnosis not present

## 2018-04-07 DIAGNOSIS — O24919 Unspecified diabetes mellitus in pregnancy, unspecified trimester: Secondary | ICD-10-CM | POA: Insufficient documentation

## 2018-04-07 DIAGNOSIS — O24112 Pre-existing diabetes mellitus, type 2, in pregnancy, second trimester: Secondary | ICD-10-CM | POA: Diagnosis present

## 2018-04-07 DIAGNOSIS — O24012 Pre-existing diabetes mellitus, type 1, in pregnancy, second trimester: Secondary | ICD-10-CM | POA: Diagnosis not present

## 2018-04-07 DIAGNOSIS — E119 Type 2 diabetes mellitus without complications: Secondary | ICD-10-CM

## 2018-04-07 DIAGNOSIS — O24819 Other pre-existing diabetes mellitus in pregnancy, unspecified trimester: Secondary | ICD-10-CM

## 2018-04-07 DIAGNOSIS — Z3689 Encounter for other specified antenatal screening: Secondary | ICD-10-CM

## 2018-04-07 HISTORY — DX: Chlamydial infection, unspecified: A74.9

## 2018-04-07 LAB — HEMOGLOBIN A1C
Hgb A1c MFr Bld: 5.2 % (ref 4.8–5.6)
Mean Plasma Glucose: 102.54 mg/dL

## 2018-04-07 NOTE — Consult Note (Signed)
Maternal Fetal Medicine Consultation  Requesting Provider(s): Ozan  Primary OB: Ozan Reason for consultation: Pre-existing diabetes in pregnancy  HPI: 21yo P0 at 20+3 weeks with a 4 year history of diabetes. 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 Since diagnosis she has never had DKA. She has seen a an ophthalmologist within the last 6 months and has no evidence of retinopathy. She was previously managed on dulaglutide but this was stopped by Dr. Charlotta Newton and glyburide 2.5mg  BID was begun, pending an appointment with a [facility] endocrinologist, Dr. Everardo All. The patient reports that she has experienced worsening of her blood sugar levels since starting glyburide; she did not bring her glucose logs, but dates her fasting this morning was 140. This is consistent with oral hypoglycemic effects on LADA. She does not remember when she last had a HgbA1C. My review of the EMR shows a level of 7.8% in January of 2015 The pregnancy course has been completely benign so far.  OB History: OB History    Gravida  1   Para      Term      Preterm      AB      Living  0     SAB      TAB      Ectopic      Multiple      Live Births              PMH:  Past Medical History:  Diagnosis Date  . Chlamydia   . Diabetes mellitus without complication (HCC)     PSH:  Past Surgical History:  Procedure Laterality Date  . NO PAST SURGERIES     Meds: PNV, Glyburide Allergies: Nasonex FH: Father has type 1 DM, mother has type 2 DM Soc: denies tobacco, alcohol and illicit drugs  Review of Systems: no vaginal bleeding or cramping/contractions, no LOF, no nausea/vomiting. All other systems reviewed and are negative.  PE:  VS: BP    111/64        Pulse 93       Weight 193 lb GEN: well-appearing female ABD: gravid, NT  Please see separate document for fetal ultrasound report.  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 We will, of course, defer glycemic management to Dr. Everardo All. The patient understands our philosophy of near-euglycemia during pregnancy to minimize poor obstetrical outcomes. She has a good understanding of her disease. She may have had some compliance issues in the past but appears motivated to attempt strict control. I have taken the liberty of scheduling a fetal echocardiogram. He initial anatomic survey is normal, with fetal profile and aortic arch images being less-than-optimal. We will repeat a scan in 4 weeks. The patient needs serial growth scans every 4 weeks after that, and needs antepartum testing from 30-32 weeks onward.  Thank you for the opportunity to be a part of the care of Jill Montoya. Please contact our office if we can be of further assistance.   I spent approximately 30 minutes with this patient with over 50% of time spent in face-to-face counseling.

## 2018-04-10 ENCOUNTER — Emergency Department (HOSPITAL_COMMUNITY)
Admission: EM | Admit: 2018-04-10 | Discharge: 2018-04-10 | Disposition: A | Discharge status: Home / Self Care | Payer: 59 | Source: Home / Self Care

## 2018-04-10 ENCOUNTER — Encounter (HOSPITAL_COMMUNITY): Payer: Self-pay | Admitting: *Deleted

## 2018-04-10 ENCOUNTER — Inpatient Hospital Stay (HOSPITAL_COMMUNITY)
Admission: AD | Admit: 2018-04-10 | Discharge: 2018-04-10 | Disposition: A | Discharge status: Home / Self Care | Payer: 59 | Source: Ambulatory Visit | Attending: Obstetrics and Gynecology | Admitting: Obstetrics and Gynecology

## 2018-04-10 DIAGNOSIS — Z3A20 20 weeks gestation of pregnancy: Secondary | ICD-10-CM | POA: Insufficient documentation

## 2018-04-10 DIAGNOSIS — B372 Candidiasis of skin and nail: Secondary | ICD-10-CM | POA: Insufficient documentation

## 2018-04-10 DIAGNOSIS — O26892 Other specified pregnancy related conditions, second trimester: Secondary | ICD-10-CM | POA: Diagnosis present

## 2018-04-10 DIAGNOSIS — O98812 Other maternal infectious and parasitic diseases complicating pregnancy, second trimester: Secondary | ICD-10-CM | POA: Insufficient documentation

## 2018-04-10 DIAGNOSIS — R109 Unspecified abdominal pain: Secondary | ICD-10-CM | POA: Diagnosis present

## 2018-04-10 LAB — WET PREP, GENITAL
Clue Cells Wet Prep HPF POC: NONE SEEN
Sperm: NONE SEEN
Trich, Wet Prep: NONE SEEN
Yeast Wet Prep HPF POC: NONE SEEN

## 2018-04-10 LAB — URINALYSIS, ROUTINE W REFLEX MICROSCOPIC
Bilirubin Urine: NEGATIVE
Glucose, UA: 150 mg/dL — AB
Hgb urine dipstick: NEGATIVE
Ketones, ur: NEGATIVE mg/dL
Leukocytes, UA: NEGATIVE
Nitrite: NEGATIVE
Protein, ur: NEGATIVE mg/dL
Specific Gravity, Urine: 1.015 (ref 1.005–1.030)
pH: 8 (ref 5.0–8.0)

## 2018-04-10 MED ORDER — NYSTATIN 100000 UNIT/GM EX CREA: TOPICAL_CREAM | CUTANEOUS | 0 refills | Status: DC

## 2018-04-10 NOTE — MAU Provider Note (Signed)
History     CSN: [id number]  Arrival date and time: 04/10/18 1156   First Provider Initiated Contact with Patient 04/10/18 1402      Chief Complaint  Patient presents with  . Abdominal Pain   HPI  Ms.  Jill Montoya is a 22 y.o. year old G1P0 female at [redacted]w[redacted]d weeks gestation who presents to MAU reporting lower abdominal cramping x hour before coming in. She denies any abdominal cramping now. She denies VB, abnormal vaginal d/c, itching vaginal odor or burning.    Past Medical History:  Diagnosis Date  . Chlamydia   . Diabetes mellitus without complication [facility])     Past Surgical History:  Procedure Laterality Date  . NO PAST SURGERIES      Family History  Problem Relation Age of Onset  . Diabetes Mother   . Diabetes Father     Social History   Tobacco Use  . Smoking status: Never Smoker  . Smokeless tobacco: Never Used  Substance Use Topics  . Alcohol use: No  . Drug use: Never    Allergies:  Allergies  Allergen Reactions  . Nasonex [Mometasone Furoate]     "My nasal passages close up"    Medications Prior to Admission  Medication Sig Dispense Refill Last Dose  . benzonatate (TESSALON) 100 MG capsule Take 1 capsule (100 mg total) by mouth every 8 (eight) hours. (Patient not taking: Reported on 04/07/2018) 21 capsule 0 Not Taking  . GLYBURIDE PO Take by mouth.   Taking    Review of Systems  Constitutional: Negative.   HENT: Negative.   Eyes: Negative.   Respiratory: Negative.   Cardiovascular: Negative.   Gastrointestinal: Negative.   Endocrine: Negative.   Genitourinary: Positive for pelvic pain (lower abdominal cramping).  Musculoskeletal: Negative.   Skin: Negative.   Allergic/Immunologic: Negative.   Neurological: Negative.   Hematological: Negative.   Psychiatric/Behavioral: Negative.    Physical Exam   Blood pressure 131/75, pulse 86, temperature 98.5 F (36.9 C), resp. rate 18, weight 196 lb (88.9 kg), last menstrual period  11/15/2017.  Physical Exam  Nursing note and vitals reviewed. Constitutional: She is oriented to person, place, and time. She appears well-developed and well-nourished.  HENT:  Head: Normocephalic and atraumatic.  Eyes: Pupils are equal, round, and reactive to light.  Neck: Normal range of motion.  Cardiovascular: Normal rate, regular rhythm and normal heart sounds.  Respiratory: Effort normal and breath sounds normal.  GI: Soft. Bowel sounds are normal.  Genitourinary:     Genitourinary Comments: Uterus: gravid, S=D, SE: cervix is smooth, pink, no lesions, moderate amt of thick, white vaginal d/c -- WP, GC/CT done, closed/long/firm, no CMT or friability, no adnexal tenderness // labial and perineal skin very pinkish red, thickened with white adherent coating -- pt has no complaints, but suspicious for cutaneous candidiasis  Musculoskeletal: Normal range of motion.  Neurological: She is alert and oriented to person, place, and time.  Skin: Skin is warm and dry.  Psychiatric: She has a normal mood and affect. Her behavior is normal. Judgment and thought content normal.    MAU Course  Procedures  MDM CCUA Wet prep FHTs by doppler: 156 bpm   *Consult with Dr. Richardson Dopp @ 1530 - notified of patient's complaints, assessments, lab & U/S results, tx plan Rx Nystatin cream for cutaneous candidiasis - ok to d/c home, agrees with plan   Results for orders placed or performed during the hospital encounter of 04/10/18 (from the past 24 hour(s))  Urinalysis, Routine w reflex microscopic     Status: Abnormal   Collection Time: 04/10/18 12:16 PM  Result Value Ref Range   Color, Urine YELLOW YELLOW   APPearance CLEAR CLEAR   Specific Gravity, Urine 1.015 1.005 - 1.030   pH 8.0 5.0 - 8.0   Glucose, UA 150 (A) NEGATIVE mg/dL   Hgb urine dipstick NEGATIVE NEGATIVE   Bilirubin Urine NEGATIVE NEGATIVE   Ketones, ur NEGATIVE NEGATIVE mg/dL   Protein, ur NEGATIVE NEGATIVE mg/dL   Nitrite NEGATIVE  NEGATIVE   Leukocytes, UA NEGATIVE NEGATIVE  Wet prep, genital     Status: Abnormal   Collection Time: 04/10/18  2:00 PM  Result Value Ref Range   Yeast Wet Prep HPF POC NONE SEEN NONE SEEN   Trich, Wet Prep NONE SEEN NONE SEEN   Clue Cells Wet Prep HPF POC NONE SEEN NONE SEEN   WBC, Wet Prep HPF POC FEW (A) NONE SEEN   Sperm NONE SEEN     Assessment and Plan  Cutaneous candidiasis  - Rx for Nystatin cream to affected area BID  - F/U with [facility] OB[facility]N as scheduled - Discharge patient - Patient verbalized an understanding of the plan of care and agrees.    Raelyn Mora, MSN, CNM 04/10/2018, 2:03 PM

## 2018-04-10 NOTE — Discharge Instructions (Signed)

## 2018-04-10 NOTE — MAU Note (Signed)
Pt presents to MAU with complaints of abdominal cramping for an hour. Denies any VB or abnormal discharge

## 2018-04-10 NOTE — ED Notes (Signed)
Pt mother states they got a call back from pt doctor who told them to go to [facility] hospital.

## 2018-04-12 LAB — GC/CHLAMYDIA PROBE AMP (~~LOC~~) NOT AT ARMC
Chlamydia: NEGATIVE
Neisseria Gonorrhea: NEGATIVE

## 2018-04-14 ENCOUNTER — Encounter: Payer: Self-pay | Admitting: Endocrinology

## 2018-04-14 ENCOUNTER — Ambulatory Visit (INDEPENDENT_AMBULATORY_CARE_PROVIDER_SITE_OTHER): Payer: 59 | Admitting: Endocrinology

## 2018-04-14 VITALS — BP 110/60 | HR 110 | Ht 63.5 in | Wt 197.0 lb

## 2018-04-14 DIAGNOSIS — O24819 Other pre-existing diabetes mellitus in pregnancy, unspecified trimester: Secondary | ICD-10-CM | POA: Diagnosis not present

## 2018-04-14 LAB — TSH: TSH: 1.7 u[IU]/mL (ref 0.35–4.50)

## 2018-04-14 MED ORDER — METFORMIN HCL ER 500 MG PO TB24: 2000.0000 mg | ORAL_TABLET | Freq: Every day | ORAL | 11 refills | Status: DC

## 2018-04-14 NOTE — Progress Notes (Signed)
Subjective:    Patient ID: Jill Montoya, female    DOB: 02-05-1996, 22 y.o.   MRN: [id number]  HPI pt is referred by Dr Richardson Dopp, for diabetes.  Pt states DM was dx'ed in 2015; she has mild if any neuropathy of the lower extremities; she is unaware of any associated chronic complications; she has never been on insulin; pt says her diet and exercise are good; she has never had pancreatitis, pancreatic surgery, severe hypoglycemia or DKA.  She is now at 25 weeks.  She has gained 15 lbs with this pregnancy. 2 weeks ago, she changed trulicity to glyburide 2.5 mg QAM.  She says cbg varies from 80-140. It is highest after lunch.   Past Medical History:  Diagnosis Date  . Chlamydia   . Diabetes mellitus without complication [facility])     Past Surgical History:  Procedure Laterality Date  . NO PAST SURGERIES      Social History   Socioeconomic History  . Marital status: Single    Spouse name: Not on file  . Number of children: Not on file  . Years of education: Not on file  . Highest education level: Not on file  Occupational History  . Not on file  Social Needs  . Financial resource strain: Not on file  . Food insecurity:    Worry: Not on file    Inability: Not on file  . Transportation needs:    Medical: Not on file    Non-medical: Not on file  Tobacco Use  . Smoking status: Never Smoker  . Smokeless tobacco: Never Used  Substance and Sexual Activity  . Alcohol use: No  . Drug use: Never  . Sexual activity: Yes  Lifestyle  . Physical activity:    Days per week: Not on file    Minutes per session: Not on file  . Stress: Not on file  Relationships  . Social connections:    Talks on phone: Not on file    Gets together: Not on file    Attends religious service: Not on file    Active member of club or organization: Not on file    Attends meetings of clubs or organizations: Not on file    Relationship status: Not on file  . Intimate partner violence:    Fear of current or ex  partner: Not on file    Emotionally abused: Not on file    Physically abused: Not on file    Forced sexual activity: Not on file  Other Topics Concern  . Not on file  Social History Narrative  . Not on file    Current Outpatient Medications on File Prior to Visit  Medication Sig Dispense Refill  . nystatin cream (MYCOSTATIN) Apply to affected area 2 times daily 30 g 0   No current facility-administered medications on file prior to visit.     Allergies  Allergen Reactions  . Nasonex [Mometasone Furoate]     "My nasal passages close up"    Family History  Problem Relation Age of Onset  . Diabetes Mother   . Diabetes Father     BP 110/60 (BP Location: Left Arm, Patient Position: Sitting, Cuff Size: Normal)   Pulse (!) 110   Ht 5' 3.5" (1.613 m)   Wt 197 lb (89.4 kg)   LMP 11/15/2017 (Exact Date)   SpO2 98%   BMI 34.35 kg/m    Review of Systems denies fatigue, blurry vision, headache, chest pain, sob, nocturia, muscle cramps,  excessive diaphoresis, depression, cold intolerance, rhinorrhea, and easy bruising.  Nausea is improving.       Objective:   Physical Exam VS: see vs page GEN: no distress HEAD: head: no deformity eyes: no periorbital swelling, no proptosis external nose and ears are normal mouth: no lesion seen NECK: supple, thyroid is not enlarged CHEST WALL: no deformity LUNGS: clear to auscultation CV: reg rate and rhythm, no murmur ABD: abdomen is soft, nontender.  no hepatosplenomegaly.  not distended.  no hernia MUSCULOSKELETAL: muscle bulk and strength are grossly normal.  no obvious joint swelling.  gait is normal and steady EXTEMITIES: no deformity.  no ulcer on the feet.  feet are of normal color and temp.  no edema PULSES: dorsalis pedis intact bilat.  no carotid bruit NEURO:  cn 2-12 grossly intact.   readily moves all 4's.  sensation is intact to touch on the feet.   SKIN:  Normal texture and temperature.  No rash or suspicious lesion is  visible.   NODES:  None palpable at the neck PSYCH: alert, well-oriented.  Does not appear anxious nor depressed.  Lab Results  Component Value Date   HGBA1C 5.2 04/07/2018   I have reviewed outside records, and summarized: Pt was noted to have DM affecting pregnancy, and referred here.  Pt was not sensing fetal movement.  [place] is planned soon     Assessment & Plan:  Type 2 DM: well-controlled.  In my opinion, she is OK to continue oral rx for now.   Pregnancy: she needs aggressive glycemic control.   Patient Instructions  A thyroid blood test is requested for you today.  We'll let you know about the results.   good diet and exercise significantly improve the control of your diabetes.  please let me know if you wish to be referred to a dietician.  high blood sugar is very risky to your health.  you should see an eye doctor and dentist every year.  It is very important to get all recommended vaccinations.  check your blood sugar 4 times a day.  vary the time of day when you check, between before meals, after meals, and at bedtime.  also check if you have symptoms of your blood sugar being too high or too low.  please keep a record of the readings and bring it to your next appointment here (or you can bring the meter itself).  You can write it on any piece of paper.  please call us sooner if your blood sugar goes below 70, or if you have a lot of readings over 200.   I have sent a prescription to your pharmacy, to change glyburide to metformin.  Please call or message [place] next week, to tell us how the blood sugar is doing.  Please come back for a follow-up appointment in 4-6 weeks.

## 2018-04-14 NOTE — Patient Instructions (Addendum)
A thyroid blood test is requested for you today.  We'll let you know about the results.   good diet and exercise significantly improve the control of your diabetes.  please let me know if you wish to be referred to a dietician.  high blood sugar is very risky to your health.  you should see an eye doctor and dentist every year.  It is very important to get all recommended vaccinations.  check your blood sugar 4 times a day.  vary the time of day when you check, between before meals, after meals, and at bedtime.  also check if you have symptoms of your blood sugar being too high or too low.  please keep a record of the readings and bring it to your next appointment here (or you can bring the meter itself).  You can write it on any piece of paper.  please call us sooner if your blood sugar goes below 70, or if you have a lot of readings over 200.   I have sent a prescription to your pharmacy, to change glyburide to metformin.  Please call or message [place] next week, to tell us how the blood sugar is doing.  Please come back for a follow-up appointment in 4-6 weeks.

## 2018-05-03 ENCOUNTER — Other Ambulatory Visit (HOSPITAL_COMMUNITY): Payer: Self-pay | Admitting: *Deleted

## 2018-05-03 ENCOUNTER — Encounter (HOSPITAL_COMMUNITY): Payer: Self-pay

## 2018-05-03 ENCOUNTER — Other Ambulatory Visit (HOSPITAL_COMMUNITY): Payer: Self-pay | Admitting: Obstetrics and Gynecology

## 2018-05-03 ENCOUNTER — Ambulatory Visit (HOSPITAL_COMMUNITY)
Admission: RE | Admit: 2018-05-03 | Discharge: 2018-05-03 | Disposition: A | Discharge status: Home / Self Care | Payer: 59 | Source: Ambulatory Visit | Attending: Obstetrics & Gynecology | Admitting: Obstetrics & Gynecology

## 2018-05-03 DIAGNOSIS — Z3689 Encounter for other specified antenatal screening: Secondary | ICD-10-CM

## 2018-05-03 DIAGNOSIS — Z3A24 24 weeks gestation of pregnancy: Secondary | ICD-10-CM | POA: Insufficient documentation

## 2018-05-03 DIAGNOSIS — E119 Type 2 diabetes mellitus without complications: Secondary | ICD-10-CM

## 2018-05-03 DIAGNOSIS — O24112 Pre-existing diabetes mellitus, type 2, in pregnancy, second trimester: Secondary | ICD-10-CM | POA: Diagnosis not present

## 2018-05-03 DIAGNOSIS — O99212 Obesity complicating pregnancy, second trimester: Secondary | ICD-10-CM | POA: Insufficient documentation

## 2018-05-03 DIAGNOSIS — Z362 Encounter for other antenatal screening follow-up: Secondary | ICD-10-CM | POA: Insufficient documentation

## 2018-05-03 DIAGNOSIS — O24119 Pre-existing diabetes mellitus, type 2, in pregnancy, unspecified trimester: Secondary | ICD-10-CM

## 2018-05-03 NOTE — Addendum Note (Signed)
Encounter addended by: Marcellina MillinMoser, Daylen Hack L, RT on: 05/03/2018 10:10 AM  Actions taken: Imaging Exam ended

## 2018-05-04 ENCOUNTER — Other Ambulatory Visit (HOSPITAL_COMMUNITY): Payer: Self-pay | Admitting: *Deleted

## 2018-05-04 DIAGNOSIS — Z7984 Long term (current) use of oral hypoglycemic drugs: Principal | ICD-10-CM

## 2018-05-04 DIAGNOSIS — O24119 Pre-existing diabetes mellitus, type 2, in pregnancy, unspecified trimester: Secondary | ICD-10-CM

## 2018-05-05 DIAGNOSIS — O24312 Unspecified pre-existing diabetes mellitus in pregnancy, second trimester: Secondary | ICD-10-CM | POA: Diagnosis not present

## 2018-05-12 DIAGNOSIS — O24112 Pre-existing diabetes mellitus, type 2, in pregnancy, second trimester: Secondary | ICD-10-CM | POA: Diagnosis not present

## 2018-05-12 DIAGNOSIS — O0992 Supervision of high risk pregnancy, unspecified, second trimester: Secondary | ICD-10-CM | POA: Diagnosis not present

## 2018-05-16 ENCOUNTER — Ambulatory Visit (INDEPENDENT_AMBULATORY_CARE_PROVIDER_SITE_OTHER): Payer: 59 | Admitting: Endocrinology

## 2018-05-16 ENCOUNTER — Encounter: Payer: Self-pay | Admitting: Endocrinology

## 2018-05-16 VITALS — BP 118/64 | HR 108 | Ht 64.0 in | Wt 202.2 lb

## 2018-05-16 DIAGNOSIS — O24819 Other pre-existing diabetes mellitus in pregnancy, unspecified trimester: Secondary | ICD-10-CM | POA: Diagnosis not present

## 2018-05-16 MED ORDER — INSULIN LISPRO 100 UNIT/ML (KWIKPEN): 10.0000 [IU] | PEN_INJECTOR | Freq: Three times a day (TID) | SUBCUTANEOUS | 11 refills | Status: DC

## 2018-05-16 NOTE — Patient Instructions (Addendum)
check your blood sugar 4 times a day.  vary the time of day when you check, between before meals, after meals, and at bedtime.  also check if you have symptoms of your blood sugar being too high or too low.  please keep a record of the readings and bring it to your next appointment here (or you can bring the meter itself).  You can write it on any piece of paper.  please call us sooner if your blood sugar goes below 70, or if you have a lot of readings over 200.   Please continue the same metformin, and: I have sent a prescription to your pharmacy, to add humalog, 10 units 3 times a day (just before each meal).   Please call or message us in a few days, to tell us how the blood sugar is doing.  Please come back for a follow-up appointment in 2 weeks.

## 2018-05-16 NOTE — Progress Notes (Signed)
Subjective:    Patient ID: Jill Montoya, female    DOB: [birth year], 22 y.o.   MRN: [id number]  HPI Pt returns for f/u of diabetes mellitus: DM type: Insulin-requiring type 2 Dx'ed: 2015 Complications: none Therapy: metformin GDM: never DKA: never Severe hypoglycemia: never Pancreatitis: never Pancreatic imaging: never Other: she has never been on insulin, but she knows how to take.   Interval history: she brings a record of her cbg's which I have reviewed today.  It varies from 140-200's.  She is now at 27 weeks.  pt states she feels well in general.  Past Medical History:  Diagnosis Date  . Chlamydia   . Diabetes mellitus without complication [facility](HCC)     Past Surgical History:  Procedure Laterality Date  . NO PAST SURGERIES      Social History   Socioeconomic History  . Marital status: Single    Spouse name: Not on file  . Number of children: Not on file  . Years of education: Not on file  . Highest education level: Not on file  Occupational History  . Not on file  Social Needs  . Financial resource strain: Not on file  . Food insecurity:    Worry: Not on file    Inability: Not on file  . Transportation needs:    Medical: Not on file    Non-medical: Not on file  Tobacco Use  . Smoking status: Never Smoker  . Smokeless tobacco: Never Used  Substance and Sexual Activity  . Alcohol use: No  . Drug use: Never  . Sexual activity: Yes  Lifestyle  . Physical activity:    Days per week: Not on file    Minutes per session: Not on file  . Stress: Not on file  Relationships  . Social connections:    Talks on phone: Not on file    Gets together: Not on file    Attends religious service: Not on file    Active member of club or organization: Not on file    Attends meetings of clubs or organizations: Not on file    Relationship status: Not on file  . Intimate partner violence:    Fear of current or ex partner: Not on file    Emotionally abused: Not on file   Physically abused: Not on file    Forced sexual activity: Not on file  Other Topics Concern  . Not on file  Social History Narrative  . Not on file    Current Outpatient Medications on File Prior to Visit  Medication Sig Dispense Refill  . metFORMIN (GLUCOPHAGE-XR) 500 MG 24 hr tablet Take 4 tablets (2,000 mg total) by mouth daily. 120 tablet 11  . nystatin cream (MYCOSTATIN) Apply to affected area 2 times daily (Patient not taking: Reported on 05/03/2018) 30 g 0  . Prenatal Vit-Fe Fumarate-FA (PRENATAL VITAMIN PO) Take by mouth.     No current facility-administered medications on file prior to visit.     Allergies  Allergen Reactions  . Nasonex [Mometasone Furoate]     "My nasal passages close up"    Family History  Problem Relation Age of Onset  . Diabetes Mother   . Diabetes Father     BP 118/64 (BP Location: Right Arm, Patient Position: Sitting, Cuff Size: Normal)   Pulse (!) 108   Ht 5\' 4"  (1.626 m)   Wt 202 lb 3.2 oz (91.7 kg)   LMP 11/15/2017 (Exact Date)   SpO2 97%  BMI 34.71 kg/m   Review of Systems She denies hypoglycemia    Objective:   Physical Exam VITAL SIGNS:  See vs page GENERAL: no distress Pulses: dorsalis pedis intact bilat.   MSK: no deformity of the feet CV: no leg edema Skin:  no ulcer on the feet.  normal color and temp on the feet. Neuro: sensation is intact to touch on the feet  Lab Results  Component Value Date   HGBA1C 5.2 04/07/2018       Assessment & Plan:  Insulin-requiring type 2 DM, worse Pregnancy: she needs prompt, aggressive improvement in glycemia control. She is advised to always call us if cbg's go higher.    Patient Instructions  check your blood sugar 4 times a day.  vary the time of day when you check, between before meals, after meals, and at bedtime.  also check if you have symptoms of your blood sugar being too high or too low.  please keep a record of the readings and bring it to your next appointment here (or  you can bring the meter itself).  You can write it on any piece of paper.  please call us sooner if your blood sugar goes below 70, or if you have a lot of readings over 200.   Please continue the same metformin, and: I have sent a prescription to your pharmacy, to add humalog, 10 units 3 times a day (just before each meal).   Please call or message [place] in a few days, to tell us how the blood sugar is doing.  Please come back for a follow-up appointment in 2 weeks.

## 2018-05-31 ENCOUNTER — Encounter (HOSPITAL_COMMUNITY): Payer: Self-pay

## 2018-05-31 ENCOUNTER — Other Ambulatory Visit (HOSPITAL_COMMUNITY): Payer: Self-pay | Admitting: Obstetrics and Gynecology

## 2018-05-31 ENCOUNTER — Ambulatory Visit (HOSPITAL_COMMUNITY)
Admission: RE | Admit: 2018-05-31 | Discharge: 2018-05-31 | Disposition: A | Discharge status: Home / Self Care | Payer: 59 | Source: Ambulatory Visit | Attending: Obstetrics & Gynecology | Admitting: Obstetrics & Gynecology

## 2018-05-31 DIAGNOSIS — O99213 Obesity complicating pregnancy, third trimester: Secondary | ICD-10-CM | POA: Insufficient documentation

## 2018-05-31 DIAGNOSIS — O0993 Supervision of high risk pregnancy, unspecified, third trimester: Secondary | ICD-10-CM | POA: Diagnosis not present

## 2018-05-31 DIAGNOSIS — E669 Obesity, unspecified: Secondary | ICD-10-CM | POA: Diagnosis not present

## 2018-05-31 DIAGNOSIS — O24113 Pre-existing diabetes mellitus, type 2, in pregnancy, third trimester: Secondary | ICD-10-CM | POA: Diagnosis not present

## 2018-05-31 DIAGNOSIS — B372 Candidiasis of skin and nail: Secondary | ICD-10-CM

## 2018-05-31 DIAGNOSIS — Z3A28 28 weeks gestation of pregnancy: Secondary | ICD-10-CM | POA: Insufficient documentation

## 2018-05-31 DIAGNOSIS — O24119 Pre-existing diabetes mellitus, type 2, in pregnancy, unspecified trimester: Secondary | ICD-10-CM

## 2018-05-31 DIAGNOSIS — O09213 Supervision of pregnancy with history of pre-term labor, third trimester: Secondary | ICD-10-CM | POA: Diagnosis not present

## 2018-05-31 DIAGNOSIS — Z23 Encounter for immunization: Secondary | ICD-10-CM | POA: Diagnosis not present

## 2018-06-01 ENCOUNTER — Other Ambulatory Visit (HOSPITAL_COMMUNITY): Payer: Self-pay | Admitting: *Deleted

## 2018-06-01 DIAGNOSIS — O24119 Pre-existing diabetes mellitus, type 2, in pregnancy, unspecified trimester: Secondary | ICD-10-CM

## 2018-06-01 DIAGNOSIS — Z7984 Long term (current) use of oral hypoglycemic drugs: Principal | ICD-10-CM

## 2018-06-02 ENCOUNTER — Inpatient Hospital Stay (HOSPITAL_COMMUNITY)
Admission: AD | Admit: 2018-06-02 | Discharge: 2018-06-02 | Disposition: A | Discharge status: Home / Self Care | Payer: 59 | Source: Ambulatory Visit | Attending: Obstetrics and Gynecology | Admitting: Obstetrics and Gynecology

## 2018-06-02 ENCOUNTER — Inpatient Hospital Stay (HOSPITAL_BASED_OUTPATIENT_CLINIC_OR_DEPARTMENT_OTHER): Payer: 59

## 2018-06-02 ENCOUNTER — Encounter (HOSPITAL_COMMUNITY): Payer: Self-pay

## 2018-06-02 DIAGNOSIS — O99213 Obesity complicating pregnancy, third trimester: Secondary | ICD-10-CM

## 2018-06-02 DIAGNOSIS — O368190 Decreased fetal movements, unspecified trimester, not applicable or unspecified: Secondary | ICD-10-CM

## 2018-06-02 DIAGNOSIS — Z794 Long term (current) use of insulin: Secondary | ICD-10-CM | POA: Diagnosis not present

## 2018-06-02 DIAGNOSIS — Z79899 Other long term (current) drug therapy: Secondary | ICD-10-CM | POA: Diagnosis not present

## 2018-06-02 DIAGNOSIS — O24813 Other pre-existing diabetes mellitus in pregnancy, third trimester: Secondary | ICD-10-CM | POA: Insufficient documentation

## 2018-06-02 DIAGNOSIS — O368130 Decreased fetal movements, third trimester, not applicable or unspecified: Secondary | ICD-10-CM | POA: Diagnosis not present

## 2018-06-02 DIAGNOSIS — Z3A28 28 weeks gestation of pregnancy: Secondary | ICD-10-CM | POA: Insufficient documentation

## 2018-06-02 DIAGNOSIS — E139 Other specified diabetes mellitus without complications: Secondary | ICD-10-CM | POA: Diagnosis not present

## 2018-06-02 DIAGNOSIS — O24113 Pre-existing diabetes mellitus, type 2, in pregnancy, third trimester: Secondary | ICD-10-CM | POA: Diagnosis not present

## 2018-06-02 DIAGNOSIS — Z3493 Encounter for supervision of normal pregnancy, unspecified, third trimester: Secondary | ICD-10-CM

## 2018-06-02 DIAGNOSIS — Z833 Family history of diabetes mellitus: Secondary | ICD-10-CM | POA: Insufficient documentation

## 2018-06-02 LAB — GLUCOSE, CAPILLARY: Glucose-Capillary: 181 mg/dL — ABNORMAL HIGH (ref 70–99)

## 2018-06-02 NOTE — MAU Provider Note (Signed)
History     CSN: [id number]  Arrival date and time: 06/02/18 2005   None     Chief Complaint  Patient presents with  . Decreased Fetal Movement   Patient reports decreased fetal movement today. She states she has an active job and typically feels baby moving around during the day at work. Today she reports feeling baby move about three times throughout the day. She came home and did unstructured fetal kick counts and did not note fetal movement. In MAU, patient reports feeling baby move "a little bit" but not consistent with what she considers normal for her baby. Patient reports she is a "type 1.5" diabetic.    OB History    Gravida  1   Para      Term      Preterm      AB      Living  0     SAB      TAB      Ectopic      Multiple      Live Births              Past Medical History:  Diagnosis Date  . Chlamydia   . Diabetes mellitus without complication [facility])     Past Surgical History:  Procedure Laterality Date  . NO PAST SURGERIES      Family History  Problem Relation Age of Onset  . Diabetes Mother   . Diabetes Father     Social History   Tobacco Use  . Smoking status: Never Smoker  . Smokeless tobacco: Never Used  Substance Use Topics  . Alcohol use: No  . Drug use: Never    Allergies:  Allergies  Allergen Reactions  . Nasonex [Mometasone Furoate]     "My nasal passages close up"    Medications Prior to Admission  Medication Sig Dispense Refill Last Dose  . insulin lispro (HUMALOG KWIKPEN) 100 UNIT/ML KiwkPen Inject 0.1 mLs (10 Units total) into the skin 3 (three) times daily with meals. And pen needles 3/day 15 mL 11   . metFORMIN (GLUCOPHAGE-XR) 500 MG 24 hr tablet Take 4 tablets (2,000 mg total) by mouth daily. 120 tablet 11 Taking  . nystatin cream (MYCOSTATIN) Apply to affected area 2 times daily (Patient not taking: Reported on 05/03/2018) 30 g 0 Not Taking  . Prenatal Vit-Fe Fumarate-FA (PRENATAL VITAMIN PO) Take by mouth.    Taking    Review of Systems  All other systems reviewed and are negative.  Physical Exam   Vitals:   06/02/18 2015  BP: 109/68  Pulse: (!) 106  Resp: 18  Temp: 98.8 F (37.1 C)  TempSrc: Oral  SpO2: 99%    Physical Exam  Nursing note and vitals reviewed. Constitutional: She is oriented to person, place, and time. She appears well-developed and well-nourished.  HENT:  Head: Normocephalic.  Eyes: Pupils are equal, round, and reactive to light.  Cardiovascular: Normal rate, regular rhythm and normal heart sounds.  Respiratory: Effort normal and breath sounds normal.  Musculoskeletal: Normal range of motion.  Neurological: She is alert and oriented to person, place, and time.  Skin: Skin is warm and dry.  Psychiatric: She has a normal mood and affect. Her behavior is normal. Judgment and thought content normal.    MAU Course  Procedures NST BPP  MDM NST is borderline, there appears to be accels but with loss of tracing with fetal movement, reactivity cannot be definitively stated. BPP ordered to further  assess fetal wellbeing. CBG drawn as patient is a "type 1.5" diabetic. Slightly elevated glucose likely related to juice she drank in MAU. BPP is 8/8. Will discharge patient home with information on fetal kick counts.   Assessment and Plan  22 y.o. G1P0 [facility] 5563w3d [facility] Information provided on fetal kick counts Discharge home    Janeece Riggersllis K Nasrin Lanzo 06/02/2018, 9:41 PM

## 2018-06-02 NOTE — MAU Note (Signed)
Urine in lab 

## 2018-06-02 NOTE — Discharge Instructions (Signed)

## 2018-06-02 NOTE — MAU Note (Signed)
Pt reports feeling little to not fetal movement all day today. FHT in triage 140. Pt denies pain, bleeding or LOF.

## 2018-06-03 ENCOUNTER — Encounter: Payer: Self-pay | Admitting: Endocrinology

## 2018-06-03 ENCOUNTER — Ambulatory Visit (INDEPENDENT_AMBULATORY_CARE_PROVIDER_SITE_OTHER): Payer: 59 | Admitting: Endocrinology

## 2018-06-03 VITALS — BP 116/66 | HR 116 | Wt 208.4 lb

## 2018-06-03 DIAGNOSIS — O24819 Other pre-existing diabetes mellitus in pregnancy, unspecified trimester: Secondary | ICD-10-CM

## 2018-06-03 LAB — POCT GLYCOSYLATED HEMOGLOBIN (HGB A1C): Hemoglobin A1C: 6.7 % — AB (ref 4.0–5.6)

## 2018-06-03 MED ORDER — INSULIN LISPRO 100 UNIT/ML (KWIKPEN)
13.0000 [IU] | PEN_INJECTOR | Freq: Three times a day (TID) | SUBCUTANEOUS | 11 refills | Status: DC
Start: 2018-06-03 — End: 2018-07-10

## 2018-06-03 NOTE — Patient Instructions (Addendum)
check your blood sugar 4 times a day.  vary the time of day when you check, between before meals, after meals, and at bedtime.  also check if you have symptoms of your blood sugar being too high or too low.  please keep a record of the readings and bring it to your next appointment here (or you can bring the meter itself).  You can write it on any piece of paper.  please call us sooner if your blood sugar goes below 70, or if you have a lot of readings over 200.   Please continue the same metformin, and: Increase the humalog to 13 units 3 times a day (just before each meal).  Please take an extra 2 units if the blood sugar is over 150.   Please come back for a follow-up appointment in 2 weeks.

## 2018-06-03 NOTE — Progress Notes (Signed)
Subjective:    Patient ID: Jill Montoya, female    DOB: 05/13/1996, 22 y.o.   MRN: [id number]  HPI Pt returns for f/u of diabetes mellitus: DM type: Insulin-requiring type 2, during pregnancy Dx'ed: 2015 Complications: none Therapy: metformin GDM: never DKA: never Severe hypoglycemia: never Pancreatitis: never Pancreatic imaging: never Other: she has never been on insulin, but she knows how to take.   Interval history: she brings a record of her cbg's which I have reviewed today.  It varies from 76-190.  It is in general higher as the day goes on.  She is now at 29 weeks.  pt states she feels well in general.  Past Medical History:  Diagnosis Date  . Chlamydia   . Diabetes mellitus without complication [facility](HCC)     Past Surgical History:  Procedure Laterality Date  . NO PAST SURGERIES      Social History   Socioeconomic History  . Marital status: Single    Spouse name: Not on file  . Number of children: Not on file  . Years of education: Not on file  . Highest education level: Not on file  Occupational History  . Not on file  Social Needs  . Financial resource strain: Not on file  . Food insecurity:    Worry: Not on file    Inability: Not on file  . Transportation needs:    Medical: Not on file    Non-medical: Not on file  Tobacco Use  . Smoking status: Never Smoker  . Smokeless tobacco: Never Used  Substance and Sexual Activity  . Alcohol use: No  . Drug use: Never  . Sexual activity: Yes  Lifestyle  . Physical activity:    Days per week: Not on file    Minutes per session: Not on file  . Stress: Not on file  Relationships  . Social connections:    Talks on phone: Not on file    Gets together: Not on file    Attends religious service: Not on file    Active member of club or organization: Not on file    Attends meetings of clubs or organizations: Not on file    Relationship status: Not on file  . Intimate partner violence:    Fear of current or ex  partner: Not on file    Emotionally abused: Not on file    Physically abused: Not on file    Forced sexual activity: Not on file  Other Topics Concern  . Not on file  Social History Narrative  . Not on file    Current Outpatient Medications on File Prior to Visit  Medication Sig Dispense Refill  . metFORMIN (GLUCOPHAGE-XR) 500 MG 24 hr tablet Take 4 tablets (2,000 mg total) by mouth daily. 120 tablet 11  . Prenatal Vit-Fe Fumarate-FA (PRENATAL VITAMIN PO) Take by mouth.    . nystatin cream (MYCOSTATIN) Apply to affected area 2 times daily (Patient not taking: Reported on 05/03/2018) 30 g 0   No current facility-administered medications on file prior to visit.     Allergies  Allergen Reactions  . Nasonex [Mometasone Furoate]     "My nasal passages close up"    Family History  Problem Relation Age of Onset  . Diabetes Mother   . Diabetes Father     BP 116/66 (BP Location: Left Arm, Patient Position: Sitting, Cuff Size: Normal)   Pulse (!) 116   Wt 208 lb 6.4 oz (94.5 kg)   LMP 11/15/2017 (  Exact Date)   SpO2 97%   BMI 35.77 kg/m    Review of Systems She denies hypoglycemia.  She has gained 25 lbs so far.      Objective:   Physical Exam VITAL SIGNS:  See vs page GENERAL: no distress Pulses: dorsalis pedis intact bilat.   MSK: no deformity of the feet CV: no leg edema Skin:  no ulcer on the feet.  normal color and temp on the feet. Neuro: sensation is intact to touch on the feet  A1c=6.7%      Assessment & Plan:  Insulin-requiring type 2 DM: worse Pregnancy: she needs aggressive glycemic control  Patient Instructions  check your blood sugar 4 times a day.  vary the time of day when you check, between before meals, after meals, and at bedtime.  also check if you have symptoms of your blood sugar being too high or too low.  please keep a record of the readings and bring it to your next appointment here (or you can bring the meter itself).  You can write it on any  piece of paper.  please call us sooner if your blood sugar goes below 70, or if you have a lot of readings over 200.   Please continue the same metformin, and: Increase the humalog to 13 units 3 times a day (just before each meal).  Please take an extra 2 units if the blood sugar is over 150.   Please come back for a follow-up appointment in 2 weeks.

## 2018-06-06 ENCOUNTER — Encounter (HOSPITAL_COMMUNITY): Payer: Self-pay

## 2018-06-08 ENCOUNTER — Telehealth: Payer: Self-pay | Admitting: Endocrinology

## 2018-06-08 NOTE — Telephone Encounter (Signed)
Patient is needing a prescription sent to pharmacy for her ONE TOUCH ULTRA test strips.      [id number] - [place], [place] - [address]

## 2018-06-09 ENCOUNTER — Other Ambulatory Visit: Payer: Self-pay

## 2018-06-09 MED ORDER — GLUCOSE BLOOD VI STRP: ORAL_STRIP | 12 refills | Status: DC

## 2018-06-09 NOTE — Telephone Encounter (Signed)
I have sent to pharmacy for patient.  

## 2018-06-13 ENCOUNTER — Other Ambulatory Visit: Payer: Self-pay | Admitting: Endocrinology

## 2018-06-21 DIAGNOSIS — O0993 Supervision of high risk pregnancy, unspecified, third trimester: Secondary | ICD-10-CM | POA: Diagnosis not present

## 2018-06-21 DIAGNOSIS — O24113 Pre-existing diabetes mellitus, type 2, in pregnancy, third trimester: Secondary | ICD-10-CM | POA: Diagnosis not present

## 2018-06-21 DIAGNOSIS — Z7984 Long term (current) use of oral hypoglycemic drugs: Secondary | ICD-10-CM | POA: Diagnosis not present

## 2018-06-21 DIAGNOSIS — E109 Type 1 diabetes mellitus without complications: Secondary | ICD-10-CM | POA: Diagnosis not present

## 2018-06-21 DIAGNOSIS — Z3493 Encounter for supervision of normal pregnancy, unspecified, third trimester: Secondary | ICD-10-CM | POA: Diagnosis not present

## 2018-06-21 DIAGNOSIS — Z794 Long term (current) use of insulin: Secondary | ICD-10-CM | POA: Diagnosis not present

## 2018-06-28 ENCOUNTER — Inpatient Hospital Stay (HOSPITAL_COMMUNITY)
Admission: AD | Admit: 2018-06-28 | Discharge: 2018-06-28 | Disposition: A | Discharge status: Home / Self Care | Payer: 59 | Source: Ambulatory Visit | Attending: Obstetrics and Gynecology | Admitting: Obstetrics and Gynecology

## 2018-06-28 ENCOUNTER — Other Ambulatory Visit (HOSPITAL_COMMUNITY): Payer: Self-pay | Admitting: Obstetrics and Gynecology

## 2018-06-28 ENCOUNTER — Ambulatory Visit (HOSPITAL_BASED_OUTPATIENT_CLINIC_OR_DEPARTMENT_OTHER)
Admission: RE | Admit: 2018-06-28 | Discharge: 2018-06-28 | Disposition: A | Discharge status: Home / Self Care | Payer: 59 | Source: Ambulatory Visit | Attending: Obstetrics & Gynecology | Admitting: Obstetrics & Gynecology

## 2018-06-28 ENCOUNTER — Encounter (HOSPITAL_COMMUNITY): Payer: Self-pay

## 2018-06-28 ENCOUNTER — Encounter (HOSPITAL_COMMUNITY): Payer: Self-pay | Admitting: *Deleted

## 2018-06-28 DIAGNOSIS — O24119 Pre-existing diabetes mellitus, type 2, in pregnancy, unspecified trimester: Secondary | ICD-10-CM

## 2018-06-28 DIAGNOSIS — O24113 Pre-existing diabetes mellitus, type 2, in pregnancy, third trimester: Secondary | ICD-10-CM | POA: Insufficient documentation

## 2018-06-28 DIAGNOSIS — E119 Type 2 diabetes mellitus without complications: Secondary | ICD-10-CM

## 2018-06-28 DIAGNOSIS — O4702 False labor before 37 completed weeks of gestation, second trimester: Secondary | ICD-10-CM

## 2018-06-28 DIAGNOSIS — B372 Candidiasis of skin and nail: Secondary | ICD-10-CM

## 2018-06-28 DIAGNOSIS — O4700 False labor before 37 completed weeks of gestation, unspecified trimester: Secondary | ICD-10-CM

## 2018-06-28 DIAGNOSIS — Z7984 Long term (current) use of oral hypoglycemic drugs: Principal | ICD-10-CM

## 2018-06-28 DIAGNOSIS — Z3A32 32 weeks gestation of pregnancy: Secondary | ICD-10-CM

## 2018-06-28 DIAGNOSIS — Z794 Long term (current) use of insulin: Secondary | ICD-10-CM

## 2018-06-28 DIAGNOSIS — Z833 Family history of diabetes mellitus: Secondary | ICD-10-CM | POA: Diagnosis not present

## 2018-06-28 DIAGNOSIS — O479 False labor, unspecified: Secondary | ICD-10-CM

## 2018-06-28 DIAGNOSIS — O99213 Obesity complicating pregnancy, third trimester: Secondary | ICD-10-CM | POA: Diagnosis not present

## 2018-06-28 LAB — URINALYSIS, ROUTINE W REFLEX MICROSCOPIC
Bacteria, UA: NONE SEEN
Bilirubin Urine: NEGATIVE
Glucose, UA: >=500 mg/dL — AB
Hgb urine dipstick: NEGATIVE
Ketones, ur: NEGATIVE mg/dL
Nitrite: NEGATIVE
Protein, ur: NEGATIVE mg/dL
Specific Gravity, Urine: 1.003 — ABNORMAL LOW (ref 1.005–1.030)
pH: 7 (ref 5.0–8.0)

## 2018-06-28 MED ORDER — NIFEDIPINE 10 MG PO CAPS: ORAL_CAPSULE | ORAL | 0 refills | Status: DC

## 2018-06-28 MED ORDER — NIFEDIPINE 10 MG PO CAPS
10.0000 mg | ORAL_CAPSULE | ORAL | Status: AC | PRN
  Administered 2018-06-28 (×3): 10 mg via ORAL
  Filled 2018-06-28 (×3): qty 1

## 2018-06-28 MED ORDER — LACTATED RINGERS IV BOLUS
1000.0000 mL | Freq: Once | INTRAVENOUS | Status: AC
  Administered 2018-06-28: 1000 mL via INTRAVENOUS

## 2018-06-28 NOTE — MAU Note (Signed)
Sent from office, routine testing due to diabetes.  Contractions noted on the monitor. No bleeding or leaking..[name] Kitchen

## 2018-06-28 NOTE — MAU Provider Note (Signed)
History     CSN: [id number]  Arrival date and time: 06/28/18 1008   First Provider Initiated Contact with Patient 06/28/18 1029      Chief Complaint  Patient presents with  . Contractions   HPI [name] is a 22 y.o. G1P0 at 2810w1d who presents to MAU today with complaint of preterm contractions. The patient was in the office for routine antenatal testing for T2DM and contractions were noted. She felt a few of them. PO hydration was encouraged, but they did not resolve. She denies bleeding, LOF or pain at time of admission to MAU. She states last intercourse was last night. She reports normal FM.   OB History    Gravida  1   Para      Term      Preterm      AB      Living  0     SAB      TAB      Ectopic      Multiple      Live Births              Past Medical History:  Diagnosis Date  . Chlamydia   . Diabetes mellitus without complication [facility](HCC)     Past Surgical History:  Procedure Laterality Date  . NO PAST SURGERIES      Family History  Problem Relation Age of Onset  . Diabetes Mother   . Diabetes Father     Social History   Tobacco Use  . Smoking status: Never Smoker  . Smokeless tobacco: Never Used  Substance Use Topics  . Alcohol use: No  . Drug use: Never    Allergies:  Allergies  Allergen Reactions  . Nasonex [Mometasone Furoate]     "My nasal passages close up"    No medications prior to admission.    Review of Systems  Constitutional: Negative for fever.  Gastrointestinal: Negative for abdominal pain, constipation, diarrhea, nausea and vomiting.  Genitourinary: Negative for dysuria, frequency, urgency, vaginal bleeding and vaginal discharge.   Physical Exam   Blood pressure 128/87, pulse 95, temperature 98 F (36.7 C), resp. rate 16, last menstrual period 11/15/2017.  Physical Exam  Nursing note and vitals reviewed. Constitutional: She is oriented to person, place, and time. She appears well-developed and  well-nourished. No distress.  HENT:  Head: Normocephalic and atraumatic.  Cardiovascular: Normal rate.  Respiratory: Effort normal.  GI: Soft. She exhibits no distension. There is no tenderness.  Neurological: She is alert and oriented to person, place, and time.  Skin: Skin is warm and dry. No erythema.  Psychiatric: She has a normal mood and affect.  Dilation: 1.5 Effacement (%): 50 Exam by:: Vonzella NippleJulie Yussef Jorge PA    Results for orders placed or performed during the hospital encounter of 06/28/18 (from the past 24 hour(s))  Urinalysis, Routine w reflex microscopic     Status: Abnormal   Collection Time: 06/28/18 10:17 AM  Result Value Ref Range   Color, Urine STRAW (A) YELLOW   APPearance CLEAR CLEAR   Specific Gravity, Urine 1.003 (L) 1.005 - 1.030   pH 7.0 5.0 - 8.0   Glucose, UA >=500 (A) NEGATIVE mg/dL   Hgb urine dipstick NEGATIVE NEGATIVE   Bilirubin Urine NEGATIVE NEGATIVE   Ketones, ur NEGATIVE NEGATIVE mg/dL   Protein, ur NEGATIVE NEGATIVE mg/dL   Nitrite NEGATIVE NEGATIVE   Leukocytes, UA LARGE (A) NEGATIVE   RBC / HPF 0-5 0 - 5 RBC/hpf  WBC, UA 11-20 0 - 5 WBC/hpf   Bacteria, UA NONE SEEN NONE SEEN   Squamous Epithelial / LPF 0-5 0 - 5    Fetal Monitoring: Baseline: 150 bpm Variability: moderate Accelerations: 10 x 10 Decelerations: few variable decelerations  Contractions: q 2-5 minutes, resolved after Procardia x 3  MAU Course  Procedures None  MDM Unable to collect FFN since patient had intercourse last night SVE performed  Dr. Dion BodyVarnado updated on the patient status and cervical exam 10 mg Procardia q 20 minutes up to 3 doses ordered IV LR bolus started  Will monitor and recheck  Recheck ~ 30 minutes after last dose of Procardia shows no cervical change. TOCO shows no regular contractions after 3rd Procardia Discussed patient with Dr. Dion BodyVarnado. Ok for discharge at this time. Preterm labor precautions and Procardia Rx for 10 mg q 6 hours x 4 and then PRN  for contractions. Follow-up as scheduled   Assessment and Plan  A: SIUP at 3948w1d Preterm labor, third trimester   P:  Discharge home Rx for Procardia given as noted above Preterm labor precautions discussed [facility] care Patient may return to MAU as needed or if her condition were to change or worsen  Vonzella NippleJulie Solash Tullo, PA-C 06/28/2018, 1:37 PM

## 2018-06-28 NOTE — Discharge Instructions (Signed)
Braxton Hicks Contractions °Contractions of the uterus can occur throughout pregnancy, but they are not always a sign that you are in labor. You may have practice contractions called Braxton Hicks contractions. These false labor contractions are sometimes confused with true labor. °What are Braxton Hicks contractions? °Braxton Hicks contractions are tightening movements that occur in the muscles of the uterus before labor. Unlike true labor contractions, these contractions do not result in opening (dilation) and thinning of the cervix. Toward the end of pregnancy (32-34 weeks), Braxton Hicks contractions can happen more often and may become stronger. These contractions are sometimes difficult to tell apart from true labor because they can be very uncomfortable. You should not feel embarrassed if you go to the hospital with false labor. °Sometimes, the only way to tell if you are in true labor is for your health care provider to look for changes in the cervix. The health care provider will do a physical exam and may monitor your contractions. If you are not in true labor, the exam should show that your cervix is not dilating and your water has not broken. °If there are other health problems associated with your pregnancy, it is completely safe for you to be sent home with false labor. You may continue to have Braxton Hicks contractions until you go into true labor. °How to tell the difference between true labor and false labor °True labor °· Contractions last 30-70 seconds. °· Contractions become very regular. °· Discomfort is usually felt in the top of the uterus, and it spreads to the lower abdomen and low back. °· Contractions do not go away with walking. °· Contractions usually become more intense and increase in frequency. °· The cervix dilates and gets thinner. °False labor °· Contractions are usually shorter and not as strong as true labor contractions. °· Contractions are usually irregular. °· Contractions  are often felt in the front of the lower abdomen and in the groin. °· Contractions may go away when you walk around or change positions while lying down. °· Contractions get weaker and are shorter-lasting as time goes on. °· The cervix usually does not dilate or become thin. °Follow these instructions at home: °· Take over-the-counter and prescription medicines only as told by your health care provider. °· Keep up with your usual exercises and follow other instructions from your health care provider. °· Eat and drink lightly if you think you are going into labor. °· If Braxton Hicks contractions are making you uncomfortable: °? Change your position from lying down or resting to walking, or change from walking to resting. °? Sit and rest in a tub of warm water. °? Drink enough fluid to keep your urine pale yellow. Dehydration may cause these contractions. °? Do slow and deep breathing several times an hour. °· Keep all follow-up prenatal visits as told by your health care provider. This is important. °Contact a health care provider if: °· You have a fever. °· You have continuous pain in your abdomen. °Get help right away if: °· Your contractions become stronger, more regular, and closer together. °· You have fluid leaking or gushing from your vagina. °· You pass blood-tinged mucus (bloody show). °· You have bleeding from your vagina. °· You have low back pain that you never had before. °· You feel your baby’s head pushing down and causing pelvic pressure. °· Your baby is not moving inside you as much as it used to. °Summary °· Contractions that occur before labor are called Braxton   Hicks contractions, false labor, or practice contractions. °· Braxton Hicks contractions are usually shorter, weaker, farther apart, and less regular than true labor contractions. True labor contractions usually become progressively stronger and regular and they become more frequent. °· Manage discomfort from Braxton Hicks contractions by  changing position, resting in a warm bath, drinking plenty of water, or practicing deep breathing. °This information is not intended to replace advice given to you by your health care provider. Make sure you discuss any questions you have with your health care provider. °Document Released: 03/18/2017 Document Revised: 03/18/2017 Document Reviewed: 03/18/2017 °Elsevier Interactive Patient Education © 2018 [employer] ° ° °Pelvic Rest °Pelvic rest may be recommended if: °· Your placenta is partially or completely covering the opening of your cervix (placenta previa). °· There is bleeding between the wall of the uterus and the amniotic sac in the first trimester of pregnancy (subchorionic hemorrhage). °· You went into labor too early (preterm labor). ° °Based on your overall health and the health of your baby, your health care provider will decide if pelvic rest is right for you. °How do I rest my pelvis? °For as long as told by your health care provider: °· Do not have sex, sexual stimulation, or an orgasm. °· Do not use tampons. Do not douche. Do not put anything in your vagina. °· Do not lift anything that is heavier than 10 lb (4.5 kg). °· Avoid activities that take a lot of effort (are strenuous). °· Avoid any activity in which your pelvic muscles could become strained. ° °When should I seek medical care? °Seek medical care if you have: °· Cramping pain in your lower abdomen. °· Vaginal discharge. °· A low, dull backache. °· Regular contractions. °· Uterine tightening. ° °When should I seek immediate medical care? °Seek immediate medical care if: °· You have vaginal bleeding and you are pregnant. ° °This information is not intended to replace advice given to you by your health care provider. Make sure you discuss any questions you have with your health care provider. °Document Released: 02/27/2011 Document Revised: 04/09/2016 Document Reviewed: 05/06/2015 °Elsevier Interactive Patient Education © 2018 [employer] ° ° °Preterm Labor and Birth Information °Pregnancy normally lasts 39-41 weeks. Preterm labor is when labor starts early. It starts before you have been pregnant for 37 whole weeks. °What are the risk factors for preterm labor? °Preterm labor is more likely to occur in women who: °· Have an infection while pregnant. °· Have a cervix that is short. °· Have gone into preterm labor before. °· Have had surgery on their cervix. °· Are younger than age 17. °· Are older than age 35. °· Are African American. °· Are pregnant with two or more babies. °· Take street drugs while pregnant. °· Smoke while pregnant. °· Do not gain enough weight while pregnant. °· Got pregnant right after another pregnancy. ° °What are the symptoms of preterm labor? °Symptoms of preterm labor include: °· Cramps. The cramps may feel like the cramps some women get during their period. The cramps may happen with watery poop (diarrhea). °· Pain in the belly (abdomen). °· Pain in the lower back. °· Regular contractions or tightening. It may feel like your belly is getting tighter. °· Pressure in the lower belly that seems to get stronger. °· More fluid (discharge) leaking from the vagina. The fluid may be watery or bloody. °· Water breaking. ° °Why is it important to notice signs of preterm labor? °Babies who are born   early may not be fully developed. They have a higher chance for: °· Long-term heart problems. °· Long-term lung problems. °· Trouble controlling body systems, like breathing. °· Bleeding in the brain. °· A condition called cerebral palsy. °· Learning difficulties. °· Death. ° °These risks are highest for babies who are born before 34 weeks of pregnancy. °How is preterm labor treated? °Treatment depends on: °· How long you were pregnant. °· Your condition. °· The health of your baby. ° °Treatment may involve: °· Having a stitch (suture) placed in your cervix. When you give birth, your cervix opens so the baby can come out. The stitch  keeps the cervix from opening too soon. °· Staying at the hospital. °· Taking or getting medicines, such as: °? Hormone medicines. °? Medicines to stop contractions. °? Medicines to help the baby’s lungs develop. °? Medicines to prevent your baby from having cerebral palsy. ° °What should I do if I am in preterm labor? °If you think you are going into labor too soon, call your doctor right away. °How can I prevent preterm labor? °· Do not use any tobacco products. °? Examples of these are cigarettes, chewing tobacco, and e-cigarettes. °? If you need help quitting, ask your doctor. °· Do not use street drugs. °· Do not use any medicines unless you ask your doctor if they are safe for you. °· Talk with your doctor before taking any herbal supplements. °· Make sure you gain enough weight. °· Watch for infection. If you think you might have an infection, get it checked right away. °· If you have gone into preterm labor before, tell your doctor. °This information is not intended to replace advice given to you by your health care provider. Make sure you discuss any questions you have with your health care provider. °Document Released: 01/29/2009 Document Revised: 04/14/2016 Document Reviewed: 03/25/2016 °Elsevier Interactive Patient Education © 2018 [employer]. ° °

## 2018-06-30 ENCOUNTER — Encounter (HOSPITAL_COMMUNITY): Payer: Self-pay | Admitting: *Deleted

## 2018-06-30 ENCOUNTER — Inpatient Hospital Stay (HOSPITAL_COMMUNITY)
Admission: AD | Admit: 2018-06-30 | Discharge: 2018-06-30 | Disposition: A | Discharge status: Home / Self Care | Payer: 59 | Source: Ambulatory Visit | Attending: Obstetrics and Gynecology | Admitting: Obstetrics and Gynecology

## 2018-06-30 DIAGNOSIS — Z3A32 32 weeks gestation of pregnancy: Secondary | ICD-10-CM | POA: Insufficient documentation

## 2018-06-30 DIAGNOSIS — O24113 Pre-existing diabetes mellitus, type 2, in pregnancy, third trimester: Secondary | ICD-10-CM | POA: Insufficient documentation

## 2018-06-30 DIAGNOSIS — Z794 Long term (current) use of insulin: Secondary | ICD-10-CM | POA: Diagnosis not present

## 2018-06-30 DIAGNOSIS — E119 Type 2 diabetes mellitus without complications: Secondary | ICD-10-CM | POA: Diagnosis not present

## 2018-06-30 DIAGNOSIS — O4703 False labor before 37 completed weeks of gestation, third trimester: Secondary | ICD-10-CM

## 2018-06-30 DIAGNOSIS — O3663X0 Maternal care for excessive fetal growth, third trimester, not applicable or unspecified: Secondary | ICD-10-CM | POA: Insufficient documentation

## 2018-06-30 LAB — WET PREP, GENITAL
Clue Cells Wet Prep HPF POC: NONE SEEN
Sperm: NONE SEEN
Trich, Wet Prep: NONE SEEN
Yeast Wet Prep HPF POC: NONE SEEN

## 2018-06-30 LAB — URINALYSIS, ROUTINE W REFLEX MICROSCOPIC
Bilirubin Urine: NEGATIVE
Glucose, UA: >=500 mg/dL — AB
Hgb urine dipstick: NEGATIVE
Ketones, ur: NEGATIVE mg/dL
Nitrite: NEGATIVE
Protein, ur: NEGATIVE mg/dL
Specific Gravity, Urine: 1.013 (ref 1.005–1.030)
pH: 7 (ref 5.0–8.0)

## 2018-06-30 MED ORDER — NIFEDIPINE 10 MG PO CAPS: 10.0000 mg | ORAL_CAPSULE | Freq: Three times a day (TID) | ORAL | Status: DC

## 2018-06-30 MED ORDER — NIFEDIPINE 10 MG PO CAPS
10.0000 mg | ORAL_CAPSULE | ORAL | Status: DC | PRN
  Administered 2018-06-30 (×3): 10 mg via ORAL
  Filled 2018-06-30 (×3): qty 1

## 2018-06-30 MED ORDER — NIFEDIPINE 10 MG PO CAPS: 10.0000 mg | ORAL_CAPSULE | Freq: Three times a day (TID) | ORAL | 1 refills | Status: DC

## 2018-06-30 NOTE — MAU Note (Signed)
Urine in lab 

## 2018-06-30 NOTE — MAU Note (Signed)
Pt was at work, felt "something come out" at approximately 0800.  Went to [facility], underwear was wet with clear fluid.  Was seen in MAU 2 days ago for uc's, feels some pressure but no pain.  Denies bleeding.  Reports good fetal movement.

## 2018-06-30 NOTE — MAU Provider Note (Signed)
History     CSN: [id number]  Arrival date and time: 06/30/18 4098   First Provider Initiated Contact with Patient 06/30/18 1011      Chief Complaint  Patient presents with  . Rupture of Membranes   Jill Montoya is a 22 y.o. G1P0 pre-gestational diabetic at [redacted]w[redacted]d presenting with a spurt of vaginal fluid that occurred once about an hour ago. No gush of fluid or use of peri-pad She also reports seeing pink on toilet paper after wiping last night.  No prior episodes of vaginal bleeding.  No irritative vaginal discharge.  No previous episodes.  Seen 2 days ago for preterm contractions and placed on as needed Procardia which she last took yesterday evening.  Unaware of contractions.  Fasting blood sugar this morning 147 after eating late last night.  States fastings usually in the 90s to low 100s and postprandials 120s to 130s.  Ultrasound 06/28/2018 showed EFW 5 pounds 7 ounces and 88th percentile, AC greater than 97th percentile, normal amniotic fluid volume.        Past Medical History:  Diagnosis Date  . Chlamydia   . Diabetes mellitus without complication [facility])     Past Surgical History:  Procedure Laterality Date  . NO PAST SURGERIES      Family History  Problem Relation Age of Onset  . Diabetes Mother   . Diabetes Father     Social History   Tobacco Use  . Smoking status: Never Smoker  . Smokeless tobacco: Never Used  Substance Use Topics  . Alcohol use: No  . Drug use: Never    Allergies:  Allergies  Allergen Reactions  . Nasonex [Mometasone Furoate]     "My nasal passages close up"    Medications Prior to Admission  Medication Sig Dispense Refill Last Dose  . glucose blood (ONE TOUCH ULTRA TEST) test strip USED TO CHECK BLOOD SUGARS FOUR TIMES DAILY. 150 each 12   . insulin lispro (HUMALOG KWIKPEN) 100 UNIT/ML KiwkPen Inject 0.13 mLs (13 Units total) into the skin 3 (three) times daily with meals. And pen needles 3/day 15 mL 11 Taking  . metFORMIN  (GLUCOPHAGE-XR) 500 MG 24 hr tablet Take 4 tablets (2,000 mg total) by mouth daily. 120 tablet 11 Taking  . NIFEdipine (PROCARDIA) 10 MG capsule 1 tab (10 mg) q 6 hours x 4 doses then PRN 12 capsule 0   . nystatin cream (MYCOSTATIN) Apply to affected area 2 times daily (Patient not taking: Reported on 05/03/2018) 30 g 0 Not Taking  . Prenatal Vit-Fe Fumarate-FA (PRENATAL VITAMIN PO) Take by mouth.   Taking    Review of Systems  Constitutional: Negative for appetite change, chills, fatigue and fever.  Cardiovascular: Negative for palpitations.  Gastrointestinal: Negative for abdominal pain, diarrhea, nausea and rectal pain.  Genitourinary: Positive for vaginal bleeding. Negative for dysuria, flank pain, frequency and urgency.  Musculoskeletal: Positive for back pain.  Neurological: Negative for dizziness.   Physical Exam   Blood pressure 125/66, pulse (!) 109, temperature 98.5 F (36.9 C), temperature source Oral, resp. rate 18, height 5\' 4"  (1.626 m), weight 99.3 kg, last menstrual period 11/15/2017.  Physical Exam  Nursing note and vitals reviewed. Constitutional: She is oriented to person, place, and time. She appears well-developed and well-nourished. No distress.  HENT:  Head: Normocephalic.  Eyes: No scleral icterus.  Neck: Neck supple. No thyromegaly present.  Cardiovascular: Normal rate.  Respiratory: Effort normal.  GI: Soft. There is no tenderness.  On initial exam  UCs mildly palpable  Genitourinary:  Genitourinary Comments: SSE: perivulvar discoloration (patient applying topical nystatin for irritation) Vagina: pink, scant mucoid discharge with small pink streak, cx visually closed; neg pooling; negative fern SVE posterior, long, 1.5cm; no blood  Musculoskeletal: Normal range of motion. She exhibits no tenderness.  Neurological: She is alert and oriented to person, place, and time.  Skin: Skin is warm and dry.  Psychiatric: She has a normal mood and affect. Her behavior  is normal.    MAU Course  Procedures  Results for orders placed or performed during the hospital encounter of 06/30/18 (from the past 24 hour(s))  Urinalysis, Routine w reflex microscopic     Status: Abnormal   Collection Time: 06/30/18  8:13 AM  Result Value Ref Range   Color, Urine YELLOW YELLOW   APPearance CLEAR CLEAR   Specific Gravity, Urine 1.013 1.005 - 1.030   pH 7.0 5.0 - 8.0   Glucose, UA >=500 (A) NEGATIVE mg/dL   Hgb urine dipstick NEGATIVE NEGATIVE   Bilirubin Urine NEGATIVE NEGATIVE   Ketones, ur NEGATIVE NEGATIVE mg/dL   Protein, ur NEGATIVE NEGATIVE mg/dL   Nitrite NEGATIVE NEGATIVE   Leukocytes, UA SMALL (A) NEGATIVE   RBC / HPF 0-5 0 - 5 RBC/hpf   WBC, UA 11-20 0 - 5 WBC/hpf   Bacteria, UA RARE (A) NONE SEEN   Squamous Epithelial / LPF 0-5 0 - 5  Wet prep, genital     Status: Abnormal   Collection Time: 06/30/18 10:57 AM  Result Value Ref Range   Yeast Wet Prep HPF POC NONE SEEN NONE SEEN   Trich, Wet Prep NONE SEEN NONE SEEN   Clue Cells Wet Prep HPF POC NONE SEEN NONE SEEN   WBC, Wet Prep HPF POC FEW (A) NONE SEEN   Sperm NONE SEEN    Fern negative Urine culture sent  Procarcardia 10mg  po given x3   Fetal monitoring FHR baseline 145-150, moderate variability, accelerations present, no decelerations Toco: Initially UCs 2 to 4 minutes diminished to q. 5 to 10 minutes, then to rare with  after third dose of Procardia given  MDM Consult with Dr.Varnado>if cervix unchanged and remains stable with few contractions will send home on regular Procardia and pelvic rest with follow-up in the office Monday.  No betamethasone at this time.  Consider fetal fibronectin next visit.  Assessment and Plan  G1 at 4913w3d 1. Preterm uterine contractions in third trimester, antepartum   Type 2 diabetes LGA Allergies as of 06/30/2018      Reactions   Nasonex [mometasone Furoate]    "My nasal passages close up"      Medication List    TAKE these medications    glucose blood test strip USED TO CHECK BLOOD SUGARS FOUR TIMES DAILY.   insulin lispro 100 UNIT/ML KiwkPen Commonly known as:  HUMALOG Inject 0.13 mLs (13 Units total) into the skin 3 (three) times daily with meals. And pen needles 3/day   metFORMIN 500 MG 24 hr tablet Commonly known as:  GLUCOPHAGE-XR Take 4 tablets (2,000 mg total) by mouth daily.   NIFEdipine 10 MG capsule Commonly known as:  PROCARDIA Take 1 capsule (10 mg total) by mouth every 8 (eight) hours. What changed:    how much to take  how to take this  when to take this  additional instructions   nystatin cream Commonly known as:  MYCOSTATIN Apply to affected area 2 times daily   PRENATAL VITAMIN PO Take by mouth.  Follow-up Information    Geryl RankinsVarnado, Evelyn, MD. Schedule an appointment as soon as possible for a visit in 4 day(s).   Specialty:  Obstetrics and Gynecology Contact information: [address] [place] [address] [phone]          Maddyx Vallie CNM 06/30/2018, 10:12 AM

## 2018-06-30 NOTE — Discharge Instructions (Signed)

## 2018-07-01 ENCOUNTER — Telehealth: Payer: Self-pay | Admitting: Certified Nurse Midwife

## 2018-07-01 ENCOUNTER — Other Ambulatory Visit (HOSPITAL_COMMUNITY): Payer: Self-pay | Admitting: *Deleted

## 2018-07-01 ENCOUNTER — Encounter: Payer: Self-pay | Admitting: Certified Nurse Midwife

## 2018-07-01 DIAGNOSIS — B951 Streptococcus, group B, as the cause of diseases classified elsewhere: Secondary | ICD-10-CM | POA: Insufficient documentation

## 2018-07-01 DIAGNOSIS — O2343 Unspecified infection of urinary tract in pregnancy, third trimester: Secondary | ICD-10-CM

## 2018-07-01 DIAGNOSIS — O24119 Pre-existing diabetes mellitus, type 2, in pregnancy, unspecified trimester: Secondary | ICD-10-CM

## 2018-07-01 LAB — CULTURE, OB URINE: Culture: 70000 — AB

## 2018-07-01 MED ORDER — CEPHALEXIN 500 MG PO CAPS: 500.0000 mg | ORAL_CAPSULE | Freq: Four times a day (QID) | ORAL | 0 refills | Status: DC

## 2018-07-01 NOTE — Progress Notes (Signed)
CRITICAL VALUE ALERT  Critical Value:  +GBS in urine  Date & Time Notied:07/01/2018 @1435   Provider Notified: Bhambri CNM  Orders Received/Actions taken: no new orders

## 2018-07-01 NOTE — Telephone Encounter (Signed)
+  GBS in urine, Rx for Keflex. Pt notified, questions answered.

## 2018-07-02 ENCOUNTER — Encounter (HOSPITAL_COMMUNITY): Payer: Self-pay

## 2018-07-02 ENCOUNTER — Inpatient Hospital Stay (HOSPITAL_COMMUNITY)
Admission: AD | Admit: 2018-07-02 | Discharge: 2018-07-03 | Disposition: A | Discharge status: Home / Self Care | Payer: 59 | Source: Ambulatory Visit | Attending: Obstetrics & Gynecology | Admitting: Obstetrics & Gynecology

## 2018-07-02 DIAGNOSIS — Z3A32 32 weeks gestation of pregnancy: Secondary | ICD-10-CM | POA: Insufficient documentation

## 2018-07-02 DIAGNOSIS — B951 Streptococcus, group B, as the cause of diseases classified elsewhere: Secondary | ICD-10-CM

## 2018-07-02 DIAGNOSIS — B372 Candidiasis of skin and nail: Secondary | ICD-10-CM

## 2018-07-02 DIAGNOSIS — O2343 Unspecified infection of urinary tract in pregnancy, third trimester: Principal | ICD-10-CM

## 2018-07-02 NOTE — MAU Note (Signed)
Has been seen for PTL in past and currently on Procardia. Last dose 2320. Having ctxs on and off this afternoon and then had a bad cramp in lower abd for an hour. Spotted one time this afternoon with pink d/c but had sve 2 days ago. 1.5cm then

## 2018-07-03 ENCOUNTER — Other Ambulatory Visit: Payer: Self-pay

## 2018-07-03 DIAGNOSIS — O42913 Preterm premature rupture of membranes, unspecified as to length of time between rupture and onset of labor, third trimester: Secondary | ICD-10-CM | POA: Diagnosis not present

## 2018-07-03 DIAGNOSIS — E119 Type 2 diabetes mellitus without complications: Secondary | ICD-10-CM | POA: Diagnosis not present

## 2018-07-03 DIAGNOSIS — Z3A33 33 weeks gestation of pregnancy: Secondary | ICD-10-CM | POA: Diagnosis not present

## 2018-07-03 DIAGNOSIS — Z3A32 32 weeks gestation of pregnancy: Secondary | ICD-10-CM | POA: Diagnosis not present

## 2018-07-03 DIAGNOSIS — Z794 Long term (current) use of insulin: Secondary | ICD-10-CM | POA: Diagnosis not present

## 2018-07-03 DIAGNOSIS — O24113 Pre-existing diabetes mellitus, type 2, in pregnancy, third trimester: Secondary | ICD-10-CM | POA: Diagnosis not present

## 2018-07-03 DIAGNOSIS — O2412 Pre-existing diabetes mellitus, type 2, in childbirth: Secondary | ICD-10-CM | POA: Diagnosis not present

## 2018-07-03 DIAGNOSIS — O99824 Streptococcus B carrier state complicating childbirth: Secondary | ICD-10-CM | POA: Diagnosis not present

## 2018-07-03 LAB — CBC WITH DIFFERENTIAL/PLATELET
Basophils Absolute: 0 10*3/uL (ref 0.0–0.1)
Basophils Relative: 0 %
Eosinophils Absolute: 0.1 10*3/uL (ref 0.0–0.7)
Eosinophils Relative: 1 %
HCT: 34.5 % — ABNORMAL LOW (ref 36.0–46.0)
Hemoglobin: 11.7 g/dL — ABNORMAL LOW (ref 12.0–15.0)
Lymphocytes Relative: 20 %
Lymphs Abs: 1.8 10*3/uL (ref 0.7–4.0)
MCH: 30.2 pg (ref 26.0–34.0)
MCHC: 33.9 g/dL (ref 30.0–36.0)
MCV: 89.1 fL (ref 78.0–100.0)
Monocytes Absolute: 1.1 10*3/uL — ABNORMAL HIGH (ref 0.1–1.0)
Monocytes Relative: 12 %
Neutro Abs: 6 10*3/uL (ref 1.7–7.7)
Neutrophils Relative %: 67 %
Other: 0 %
Platelets: 305 10*3/uL (ref 150–400)
RBC: 3.87 MIL/uL (ref 3.87–5.11)
RDW: 12.5 % (ref 11.5–15.5)
WBC: 9 10*3/uL (ref 4.0–10.5)

## 2018-07-03 LAB — COMPREHENSIVE METABOLIC PANEL
ALT: 28 U/L (ref 0–44)
AST: 21 U/L (ref 15–41)
Albumin: 3 g/dL — ABNORMAL LOW (ref 3.5–5.0)
Alkaline Phosphatase: 76 U/L (ref 38–126)
Anion gap: 12 (ref 5–15)
BUN: 8 mg/dL (ref 6–20)
CO2: 21 mmol/L — ABNORMAL LOW (ref 22–32)
Calcium: 9.5 mg/dL (ref 8.9–10.3)
Chloride: 101 mmol/L (ref 98–111)
Creatinine, Ser: 0.65 mg/dL (ref 0.44–1.00)
GFR calc Af Amer: >60 mL/min (ref >60)
GFR calc non Af Amer: >60 mL/min (ref >60)
Glucose, Bld: 208 mg/dL — ABNORMAL HIGH (ref 70–99)
Potassium: 4.2 mmol/L (ref 3.5–5.1)
Sodium: 134 mmol/L — ABNORMAL LOW (ref 135–145)
Total Bilirubin: 0.6 mg/dL (ref 0.3–1.2)
Total Protein: 6.1 g/dL — ABNORMAL LOW (ref 6.5–8.1)

## 2018-07-03 LAB — URINALYSIS, ROUTINE W REFLEX MICROSCOPIC
Bacteria, UA: NONE SEEN
Bilirubin Urine: NEGATIVE
Glucose, UA: >=500 mg/dL — AB
Hgb urine dipstick: NEGATIVE
Ketones, ur: NEGATIVE mg/dL
Nitrite: NEGATIVE
Protein, ur: NEGATIVE mg/dL
Specific Gravity, Urine: 1.016 (ref 1.005–1.030)
Trans Epithel, UA: <1
pH: 7 (ref 5.0–8.0)

## 2018-07-03 MED ORDER — ACETAMINOPHEN 325 MG PO TABS
650.0000 mg | ORAL_TABLET | Freq: Four times a day (QID) | ORAL | Status: DC | PRN
  Administered 2018-07-03: 650 mg via ORAL
  Filled 2018-07-03: qty 2

## 2018-07-03 NOTE — MAU Note (Signed)
Chief Complaint:  Abdominal Pain   None    HPI: Jill Montoya is a 22 y.o. G1P0 at [redacted]w[redacted]d who presents to maternity admissions reporting contractions since this morning. Did have intercourse today.  Took Procardia at 2320 without relief.  States pain was constant cramp did not come and go.  Spotted after having intercourse.  No pain now.  Currently being treated Keflex for UTI. Pt states normal bowel movement. Pt states not checking sugars.   Denies contractions, leakage of fluid.  Small amount of spotting. Good fetal movement.   Pregnancy Course:   Past Medical History:  Diagnosis Date  . Chlamydia   . Diabetes mellitus without complication (HCC)    OB History  Gravida Para Term Preterm AB Living  1         0  SAB TAB Ectopic Multiple Live Births               # Outcome Date GA Lbr Len/2nd Weight Sex Delivery Anes PTL Lv  1 Current            Past Surgical History:  Procedure Laterality Date  . NO PAST SURGERIES     Family History  Problem Relation Age of Onset  . Diabetes Mother   . Diabetes Father    Social History   Tobacco Use  . Smoking status: Never Smoker  . Smokeless tobacco: Never Used  Substance Use Topics  . Alcohol use: No  . Drug use: Never   Allergies  Allergen Reactions  . Nasonex [Mometasone Furoate]     "My nasal passages close up"   Medications Prior to Admission  Medication Sig Dispense Refill Last Dose  . cephALEXin (KEFLEX) 500 MG capsule Take 1 capsule (500 mg total) by mouth 4 (four) times daily. 28 capsule 0 07/02/2018 at Unknown time  . glucose blood (ONE TOUCH ULTRA TEST) test strip USED TO CHECK BLOOD SUGARS FOUR TIMES DAILY. 150 each 12 07/02/2018 at Unknown time  . insulin lispro (HUMALOG KWIKPEN) 100 UNIT/ML KiwkPen Inject 0.13 mLs (13 Units total) into the skin 3 (three) times daily with meals. And pen needles 3/day 15 mL 11 07/02/2018 at Unknown time  . metFORMIN (GLUCOPHAGE-XR) 500 MG 24 hr tablet Take 4 tablets (2,000 mg total) by  mouth daily. 120 tablet 11 07/02/2018 at Unknown time  . NIFEdipine (PROCARDIA) 10 MG capsule Take 1 capsule (10 mg total) by mouth every 8 (eight) hours. 20 capsule 1 07/02/2018 at 2320  . Prenatal Vit-Fe Fumarate-FA (PRENATAL VITAMIN PO) Take by mouth.   07/02/2018 at Unknown time  . nystatin cream (MYCOSTATIN) Apply to affected area 2 times daily (Patient not taking: Reported on 05/03/2018) 30 g 0 More than a month at Unknown time    I have reviewed patient's Past Medical Hx, Surgical Hx, Family Hx, Social Hx, medications and allergies.   ROS:  Review of Systems  Constitutional: Negative.   HENT: Negative.   Eyes: Negative.   Respiratory: Negative.   Cardiovascular: Negative.   Gastrointestinal: Positive for abdominal pain.  Endocrine: Negative.   Genitourinary: Negative.   Musculoskeletal: Negative.   Allergic/Immunologic: Negative.   Neurological: Negative.   Hematological: Negative.   Psychiatric/Behavioral: Negative.     Physical Exam   Patient Vitals for the past 24 hrs:  BP Temp Pulse Resp Height Weight  07/03/18 0212 123/73 - (!) 105 19 - -  07/02/18 2341 110/69 98.3 F (36.8 C) 95 20 5\' 4"  (1.626 m) 98.9 kg   Constitutional:  Well-developed, well-nourished female in no acute distress.  Cardiovascular: normal rate Respiratory: normal effort GI: Abd soft, non-tender, gravid appropriate for gestational age.  MS: Extremities nontender, no edema, normal ROM Neurologic: Alert and oriented x 4.  GU: Neg CVAT.  Pelvic: NEFG, physiologic discharge, no blood, cervix clean. No CMT  Dilation: 1.5 Presentation: Vertex Exam by:: Jshawn Hurta, CNM  FHT:  Baseline 140  , moderate variability, accelerations present, no decelerations Contractions: q irregular   Labs: Results for orders placed or performed during the hospital encounter of 07/02/18 (from the past 24 hour(s))  Urinalysis, Routine w reflex microscopic     Status: Abnormal   Collection Time: 07/03/18 12:07 AM  Result  Value Ref Range   Color, Urine STRAW (A) YELLOW   APPearance CLEAR CLEAR   Specific Gravity, Urine 1.016 1.005 - 1.030   pH 7.0 5.0 - 8.0   Glucose, UA >=500 (A) NEGATIVE mg/dL   Hgb urine dipstick NEGATIVE NEGATIVE   Bilirubin Urine NEGATIVE NEGATIVE   Ketones, ur NEGATIVE NEGATIVE mg/dL   Protein, ur NEGATIVE NEGATIVE mg/dL   Nitrite NEGATIVE NEGATIVE   Leukocytes, UA MODERATE (A) NEGATIVE   RBC / HPF 0-5 0 - 5 RBC/hpf   WBC, UA 11-20 0 - 5 WBC/hpf   Bacteria, UA NONE SEEN NONE SEEN   Squamous Epithelial / LPF 0-5 0 - 5   Trans Epithel, UA <1    Mucus PRESENT   Comprehensive metabolic panel     Status: Abnormal   Collection Time: 07/03/18  1:22 AM  Result Value Ref Range   Sodium 134 (L) 135 - 145 mmol/L   Potassium 4.2 3.5 - 5.1 mmol/L   Chloride 101 98 - 111 mmol/L   CO2 21 (L) 22 - 32 mmol/L   Glucose, Bld 208 (H) 70 - 99 mg/dL   BUN 8 6 - 20 mg/dL   Creatinine, Ser 5.28 0.44 - 1.00 mg/dL   Calcium 9.5 8.9 - 41.3 mg/dL   Total Protein 6.1 (L) 6.5 - 8.1 g/dL   Albumin 3.0 (L) 3.5 - 5.0 g/dL   AST 21 15 - 41 U/L   ALT 28 0 - 44 U/L   Alkaline Phosphatase 76 38 - 126 U/L   Total Bilirubin 0.6 0.3 - 1.2 mg/dL   GFR calc non Af Amer >60 >60 mL/min   GFR calc Af Amer >60 >60 mL/min   Anion gap 12 5 - 15  CBC with Differential/Platelet     Status: Abnormal (Preliminary result)   Collection Time: 07/03/18  1:22 AM  Result Value Ref Range   WBC 9.0 4.0 - 10.5 K/uL   RBC 3.87 3.87 - 5.11 MIL/uL   Hemoglobin 11.7 (L) 12.0 - 15.0 g/dL   HCT 24.4 (L) 01.0 - 27.2 %   MCV 89.1 78.0 - 100.0 fL   MCH 30.2 26.0 - 34.0 pg   MCHC 33.9 30.0 - 36.0 g/dL   RDW 53.6 64.4 - 03.4 %   Platelets 305 150 - 400 K/uL   Neutrophils Relative % PENDING %   Neutro Abs PENDING 1.7 - 7.7 K/uL   Band Neutrophils PENDING %   Lymphocytes Relative PENDING %   Lymphs Abs PENDING 0.7 - 4.0 K/uL   Monocytes Relative PENDING %   Monocytes Absolute PENDING 0.1 - 1.0 K/uL   Eosinophils Relative  PENDING %   Eosinophils Absolute PENDING 0.0 - 0.7 K/uL   Basophils Relative PENDING %   Basophils Absolute PENDING 0.0 - 0.1 K/uL  WBC Morphology PENDING    RBC Morphology PENDING    Smear Review PENDING    Other PENDING %   nRBC PENDING 0 /100 WBC   Metamyelocytes Relative PENDING %   Myelocytes PENDING %   Promyelocytes Relative PENDING %   Blasts PENDING %    Imaging:  [place] Mfm Fetal Bpp Wo Non Stress  Result Date: 06/28/2018 ----------------------------------------------------------------------  OBSTETRICS REPORT                      (Signed Final 06/28/2018 03:32 pm) ---------------------------------------------------------------------- Patient Info  ID #:       [id number]                          D.O.B.:  03-21-1996 (22 yrs)  Name:       Carlyle Basques                   Visit Date: 06/28/2018 02:01 pm ---------------------------------------------------------------------- Performed By  Performed By:     Tomma Lightning             Ref. Address:     [address]                    [place]                                                             [place]                                                             [address]                                                             [place], [place]                                                             [address]  Attending:        Noralee Space MD        Secondary Phy.:   MAU Nursing-                                                             MAU/Triage  Referred By:      Alessandra Bevels             Location:         [facility]  Charlotta Newton MD ---------------------------------------------------------------------- Orders   #  Description                                 Code   1  [place] MFM OB FOLLOW UP                         76816.01   2  [place] MFM FETAL BPP WO NON STRESS              76819.01  ----------------------------------------------------------------------   #  Ordered By               Order #        Accession #    Episode #   1  Noralee Space             [id number]      [id number]     [id number]   2  RAVI WUXLKGM             [id number]      [id number]     [id number]  ---------------------------------------------------------------------- Indications   Pre-existing diabetes, type 2, in pregnancy,   O24.113   third trimester (metformin, insulin)   [redacted] weeks gestation of pregnancy                Z3A.32   Obesity complicating pregnancy, third          O99.213   trimester  ---------------------------------------------------------------------- OB History  Blood Type:            Height:  5'3"   Weight (lb):  193       BMI:  34.18  Gravidity:    1 ---------------------------------------------------------------------- Fetal Evaluation  Num Of Fetuses:     1  Fetal Heart         151  Rate(bpm):  Cardiac Activity:   Observed  Presentation:       Cephalic  Placenta:           Posterior Fundal  P. Cord Insertion:  Previously Visualized  Amniotic Fluid  AFI FV:      Within normal limits  AFI Sum(cm)     %Tile       Largest Pocket(cm)  17.24           63          5.16  RUQ(cm)       RLQ(cm)       LUQ(cm)        LLQ(cm)  5.16          5.07          2.19           4.82 ---------------------------------------------------------------------- Biophysical Evaluation  Amniotic F.V:   Within normal limits       F. Tone:        Observed  F. Movement:    Observed                   Score:          8/8  F. Breathing:   Observed ---------------------------------------------------------------------- Biometry  BPD:      82.4  mm     G. Age:  33w 1d         71  %    CI:        78.77   %    70 - 86  FL/HC:      22.7   %    19.1 - 21.3  HC:      293.6  mm     G. Age:  32w 3d         21  %    HC/AC:      0.93        0.96 - 1.17  AC:      316.5  mm     G. Age:  35w 4d       > 97  %    FL/BPD:     80.8   %    71 - 87  FL:       66.6  mm     G. Age:  34w 2d         88  %    FL/AC:      21.0   %    20 - 24  HUM:      56.6  mm     G.  Age:  32w 6d         66  %  Est. FW:    2476  gm      5 lb 7 oz     88  % ---------------------------------------------------------------------- Gestational Age  LMP:           32w 1d        Date:  11/15/17                 EDD:   08/22/18  U/S Today:     33w 6d                                        EDD:   08/10/18  Best:          32w 1d     Det. By:  LMP  (11/15/17)          EDD:   08/22/18 ---------------------------------------------------------------------- Anatomy  Cranium:               Appears normal         Aortic Arch:            Previously seen  Cavum:                 Previously seen        Ductal Arch:            Previously seen  Ventricles:            Appears normal         Diaphragm:              Appears normal  Choroid Plexus:        Previously seen        Stomach:                Appears normal, left                                                                        sided  Cerebellum:  Previously seen        Abdomen:                Previously seen  Posterior Fossa:       Previously seen        Abdominal Wall:         Previously seen  Nuchal Fold:           Not applicable (>20    Cord Vessels:           Previously seen                         wks GA)  Face:                  Orbits and profile     Kidneys:                Appear normal                         previously seen  Lips:                  Previously seen        Bladder:                Appears normal  Thoracic:              Appears normal         Spine:                  Previously seen  Heart:                 Previously seen        Upper Extremities:      Previously seen  RVOT:                  Previously seen        Lower Extremities:      Previously seen  LVOT:                  Previously seen ---------------------------------------------------------------------- Cervix Uterus Adnexa  Cervix  Not visualized (advanced GA >24wks) ---------------------------------------------------------------------- Impression  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 Patient was evaluated in  the MAU this morning for c/o uterine contractions and  procardia was prescribed. She does not have contractions  now.  The estimated fetal weight is at the 88th percentile.  Abdominal circumference measures at greater than the 95th  percentile. Amniotic fluid is normal and good fetal activity is  seen. Antenatal testing is reassuring. BPP 8/8. ---------------------------------------------------------------------- Recommendations  Continue weekly antenatal testing till delivery. ----------------------------------------------------------------------                  Noralee Space, MD Electronically Signed Final Report   06/28/2018 03:32 pm ----------------------------------------------------------------------  [place] Mfm Ob Follow Up  Result Date: 06/28/2018 ----------------------------------------------------------------------  OBSTETRICS REPORT                      (Signed Final 06/28/2018 03:32 pm) ---------------------------------------------------------------------- Patient Info  ID #:       [id number]                          D.O.B.:  1996-04-25 (22 yrs)  Name:       Carlyle Basques  Visit Date: 06/28/2018 02:01 pm ---------------------------------------------------------------------- Performed By  Performed By:     Tomma Lightning             Ref. Address:     [address]                    [place]                                                             [address]                                                             [address]                                                             [place], [place]                                                             [address]  Attending:        Noralee Space MD        Secondary Phy.:   MAU Nursing-                                                             MAU/Triage  Referred By:      Alessandra Bevels             Location:         [facility] MD  ---------------------------------------------------------------------- Orders   #  Description                                 Code   1  [place] MFM OB FOLLOW UP                         76816.01   2  [place] MFM FETAL BPP WO NON STRESS              76819.01  ----------------------------------------------------------------------   #  Ordered By               Order #        Accession #    Episode #   1  RAVI [facility]  [id number]      [id number]     [id number]   2  RAVI Judeth [place]             [id number]      [id number]     [id number]  ---------------------------------------------------------------------- Indications   Pre-existing diabetes, type 2, in pregnancy,   O24.113   third trimester (metformin, insulin)   [redacted] weeks gestation of pregnancy                Z3A.32   Obesity complicating pregnancy, third          O99.213   trimester  ---------------------------------------------------------------------- OB History  Blood Type:            Height:  5'3"   Weight (lb):  193       BMI:  34.18  Gravidity:    1 ---------------------------------------------------------------------- Fetal Evaluation  Num Of Fetuses:     1  Fetal Heart         151  Rate(bpm):  Cardiac Activity:   Observed  Presentation:       Cephalic  Placenta:           Posterior Fundal  P. Cord Insertion:  Previously Visualized  Amniotic Fluid  AFI FV:      Within normal limits  AFI Sum(cm)     %Tile       Largest Pocket(cm)  17.24           63          5.16  RUQ(cm)       RLQ(cm)       LUQ(cm)        LLQ(cm)  5.16          5.07          2.19           4.82 ---------------------------------------------------------------------- Biophysical Evaluation  Amniotic F.V:   Within normal limits       F. Tone:        Observed  F. Movement:    Observed                   Score:          8/8  F. Breathing:   Observed ---------------------------------------------------------------------- Biometry  BPD:      82.4  mm     G. Age:  33w 1d         71  %    CI:        78.77   %     70 - 86                                                          FL/HC:      22.7   %    19.1 - 21.3  HC:      293.6  mm     G. Age:  32w 3d         21  %    HC/AC:      0.93        0.96 - 1.17  AC:      316.5  mm     G. Age:  35w 4d       > 97  %    FL/BPD:  80.8   %    71 - 87  FL:       66.6  mm     G. Age:  34w 2d         88  %    FL/AC:      21.0   %    20 - 24  HUM:      56.6  mm     G. Age:  32w 6d         66  %  Est. FW:    2476  gm      5 lb 7 oz     88  % ---------------------------------------------------------------------- Gestational Age  LMP:           32w 1d        Date:  11/15/17                 EDD:   08/22/18  U/S Today:     33w 6d                                        EDD:   08/10/18  Best:          32w 1d     Det. By:  LMP  (11/15/17)          EDD:   08/22/18 ---------------------------------------------------------------------- Anatomy  Cranium:               Appears normal         Aortic Arch:            Previously seen  Cavum:                 Previously seen        Ductal Arch:            Previously seen  Ventricles:            Appears normal         Diaphragm:              Appears normal  Choroid Plexus:        Previously seen        Stomach:                Appears normal, left                                                                        sided  Cerebellum:            Previously seen        Abdomen:                Previously seen  Posterior Fossa:       Previously seen        Abdominal Wall:         Previously seen  Nuchal Fold:           Not applicable (>20    Cord Vessels:           Previously seen  wks GA)  Face:                  Orbits and profile     Kidneys:                Appear normal                         previously seen  Lips:                  Previously seen        Bladder:                Appears normal  Thoracic:              Appears normal         Spine:                  Previously seen  Heart:                 Previously seen        Upper  Extremities:      Previously seen  RVOT:                  Previously seen        Lower Extremities:      Previously seen  LVOT:                  Previously seen ---------------------------------------------------------------------- Cervix Uterus Adnexa  Cervix  Not visualized (advanced GA >24wks) ---------------------------------------------------------------------- Impression  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 Patient was evaluated in  the MAU this morning for c/o uterine contractions and  procardia was prescribed. She does not have contractions  now.  The estimated fetal weight is at the 88th percentile.  Abdominal circumference measures at greater than the 95th  percentile. Amniotic fluid is normal and good fetal activity is  seen. Antenatal testing is reassuring. BPP 8/8. ---------------------------------------------------------------------- Recommendations  Continue weekly antenatal testing till delivery. ----------------------------------------------------------------------                  Noralee Space, MD Electronically Signed Final Report   06/28/2018 03:32 pm ----------------------------------------------------------------------   MAU Course: Orders Placed This Encounter  Procedures  . Urinalysis, Routine w reflex microscopic  . Comprehensive metabolic panel  . CBC with Differential/Platelet  . Discharge patient Discharge disposition: 01-Home or Self Care; Discharge patient date: 07/03/2018   Meds ordered this encounter  Medications  . acetaminophen (TYLENOL) tablet 650 mg    MDM: PE, SVE, NST, Tylenol, CBC and CMP Assessment: Preterm contractions Abdominal pain. Reactive NST T2diabetes noncompliant  Plan: Discharge home in stable condition.  Labor precautions and fetal kick counts Pt states that she took her insulin.  Left before addressing her sugar,  Appt on Monday at MD office. Follow-up Information    Gynecology, [facility].   Specialty:  Obstetrics  and Gynecology Contact information: [address] [place] [address] [phone]           Allergies as of 07/03/2018      Reactions   Nasonex [mometasone Furoate]    "My nasal passages close up"      Medication List    TAKE these medications   cephALEXin 500 MG capsule Commonly known as:  KEFLEX Take 1 capsule (500 mg total) by mouth 4 (four) times daily.   glucose blood test strip USED TO CHECK BLOOD  SUGARS FOUR TIMES DAILY.   insulin lispro 100 UNIT/ML KiwkPen Commonly known as:  HUMALOG Inject 0.13 mLs (13 Units total) into the skin 3 (three) times daily with meals. And pen needles 3/day   metFORMIN 500 MG 24 hr tablet Commonly known as:  GLUCOPHAGE-XR Take 4 tablets (2,000 mg total) by mouth daily.   NIFEdipine 10 MG capsule Commonly known as:  PROCARDIA Take 1 capsule (10 mg total) by mouth every 8 (eight) hours.   nystatin cream Commonly known as:  MYCOSTATIN Apply to affected area 2 times daily   PRENATAL VITAMIN PO Take by mouth.       Kenney Housemanrothero, Joeanthony Seeling Jean, CNM 07/03/2018 2:13 AM

## 2018-07-04 ENCOUNTER — Inpatient Hospital Stay (HOSPITAL_COMMUNITY): Payer: 59

## 2018-07-04 ENCOUNTER — Other Ambulatory Visit: Payer: Self-pay

## 2018-07-04 ENCOUNTER — Inpatient Hospital Stay (HOSPITAL_COMMUNITY)
Admission: AD | Admit: 2018-07-04 | Discharge: 2018-07-11 | DRG: 805 | Disposition: A | Discharge status: Home / Self Care | Payer: 59 | Attending: Obstetrics and Gynecology | Admitting: Obstetrics and Gynecology

## 2018-07-04 ENCOUNTER — Encounter (HOSPITAL_COMMUNITY): Payer: Self-pay

## 2018-07-04 ENCOUNTER — Other Ambulatory Visit: Payer: Self-pay | Admitting: Obstetrics and Gynecology

## 2018-07-04 ENCOUNTER — Inpatient Hospital Stay (HOSPITAL_BASED_OUTPATIENT_CLINIC_OR_DEPARTMENT_OTHER): Payer: 59

## 2018-07-04 DIAGNOSIS — O24919 Unspecified diabetes mellitus in pregnancy, unspecified trimester: Secondary | ICD-10-CM

## 2018-07-04 DIAGNOSIS — O99824 Streptococcus B carrier state complicating childbirth: Secondary | ICD-10-CM | POA: Diagnosis present

## 2018-07-04 DIAGNOSIS — O42913 Preterm premature rupture of membranes, unspecified as to length of time between rupture and onset of labor, third trimester: Principal | ICD-10-CM | POA: Diagnosis present

## 2018-07-04 DIAGNOSIS — Z794 Long term (current) use of insulin: Secondary | ICD-10-CM | POA: Diagnosis not present

## 2018-07-04 DIAGNOSIS — O4290 Premature rupture of membranes, unspecified as to length of time between rupture and onset of labor, unspecified weeks of gestation: Secondary | ICD-10-CM | POA: Diagnosis not present

## 2018-07-04 DIAGNOSIS — E119 Type 2 diabetes mellitus without complications: Secondary | ICD-10-CM | POA: Diagnosis present

## 2018-07-04 DIAGNOSIS — O99213 Obesity complicating pregnancy, third trimester: Secondary | ICD-10-CM | POA: Diagnosis not present

## 2018-07-04 DIAGNOSIS — O2412 Pre-existing diabetes mellitus, type 2, in childbirth: Secondary | ICD-10-CM | POA: Diagnosis present

## 2018-07-04 DIAGNOSIS — B951 Streptococcus, group B, as the cause of diseases classified elsewhere: Secondary | ICD-10-CM

## 2018-07-04 DIAGNOSIS — B372 Candidiasis of skin and nail: Secondary | ICD-10-CM

## 2018-07-04 DIAGNOSIS — O42919 Preterm premature rupture of membranes, unspecified as to length of time between rupture and onset of labor, unspecified trimester: Secondary | ICD-10-CM

## 2018-07-04 DIAGNOSIS — O4210 Premature rupture of membranes, onset of labor more than 24 hours following rupture, unspecified weeks of gestation: Secondary | ICD-10-CM | POA: Diagnosis not present

## 2018-07-04 DIAGNOSIS — O24113 Pre-existing diabetes mellitus, type 2, in pregnancy, third trimester: Secondary | ICD-10-CM | POA: Diagnosis not present

## 2018-07-04 DIAGNOSIS — O24425 Gestational diabetes mellitus in childbirth, controlled by oral hypoglycemic drugs: Secondary | ICD-10-CM | POA: Diagnosis not present

## 2018-07-04 DIAGNOSIS — Z3A33 33 weeks gestation of pregnancy: Secondary | ICD-10-CM | POA: Diagnosis not present

## 2018-07-04 DIAGNOSIS — O24119 Pre-existing diabetes mellitus, type 2, in pregnancy, unspecified trimester: Secondary | ICD-10-CM | POA: Diagnosis not present

## 2018-07-04 DIAGNOSIS — O24913 Unspecified diabetes mellitus in pregnancy, third trimester: Secondary | ICD-10-CM

## 2018-07-04 DIAGNOSIS — O2343 Unspecified infection of urinary tract in pregnancy, third trimester: Secondary | ICD-10-CM

## 2018-07-04 DIAGNOSIS — Z8759 Personal history of other complications of pregnancy, childbirth and the puerperium: Secondary | ICD-10-CM

## 2018-07-04 LAB — URINALYSIS, ROUTINE W REFLEX MICROSCOPIC
Bacteria, UA: NONE SEEN
Bilirubin Urine: NEGATIVE
Glucose, UA: >=500 mg/dL — AB
Hgb urine dipstick: NEGATIVE
Ketones, ur: 80 mg/dL — AB
Nitrite: NEGATIVE
Protein, ur: NEGATIVE mg/dL
Specific Gravity, Urine: 1.015 (ref 1.005–1.030)
pH: 6 (ref 5.0–8.0)

## 2018-07-04 LAB — CBC
HCT: 34.3 % — ABNORMAL LOW (ref 36.0–46.0)
Hemoglobin: 11.8 g/dL — ABNORMAL LOW (ref 12.0–15.0)
MCH: 30.2 pg (ref 26.0–34.0)
MCHC: 34.4 g/dL (ref 30.0–36.0)
MCV: 87.7 fL (ref 78.0–100.0)
Platelets: 297 10*3/uL (ref 150–400)
RBC: 3.91 MIL/uL (ref 3.87–5.11)
RDW: 12.8 % (ref 11.5–15.5)
WBC: 8.5 10*3/uL (ref 4.0–10.5)

## 2018-07-04 LAB — GLUCOSE, CAPILLARY
Glucose-Capillary: 124 mg/dL — ABNORMAL HIGH (ref 70–99)
Glucose-Capillary: 174 mg/dL — ABNORMAL HIGH (ref 70–99)
Glucose-Capillary: 176 mg/dL — ABNORMAL HIGH (ref 70–99)
Glucose-Capillary: 241 mg/dL — ABNORMAL HIGH (ref 70–99)

## 2018-07-04 LAB — ABO/RH: ABO/RH(D): B POS

## 2018-07-04 LAB — TYPE AND SCREEN
ABO/RH(D): B POS
Antibody Screen: NEGATIVE

## 2018-07-04 MED ORDER — AZITHROMYCIN 500 MG PO TABS
500.0000 mg | ORAL_TABLET | Freq: Every day | ORAL | Status: DC
  Administered 2018-07-04 – 2018-07-08 (×5): 500 mg via ORAL
  Filled 2018-07-04 (×3): qty 2
  Filled 2018-07-04: qty 1
  Filled 2018-07-04 (×2): qty 2

## 2018-07-04 MED ORDER — MAGNESIUM SULFATE BOLUS VIA INFUSION
4.0000 g | Freq: Once | INTRAVENOUS | Status: AC
  Administered 2018-07-04: 4 g via INTRAVENOUS
  Filled 2018-07-04: qty 500

## 2018-07-04 MED ORDER — BETAMETHASONE SOD PHOS & ACET 6 (3-3) MG/ML IJ SUSP
12.0000 mg | INTRAMUSCULAR | Status: AC
  Administered 2018-07-04 – 2018-07-05 (×2): 12 mg via INTRAMUSCULAR
  Filled 2018-07-04 (×2): qty 2

## 2018-07-04 MED ORDER — AMOXICILLIN 500 MG PO CAPS
500.0000 mg | ORAL_CAPSULE | Freq: Three times a day (TID) | ORAL | Status: DC
  Administered 2018-07-06 – 2018-07-08 (×8): 500 mg via ORAL
  Filled 2018-07-04 (×10): qty 1

## 2018-07-04 MED ORDER — PRENATAL MULTIVITAMIN CH
1.0000 | ORAL_TABLET | Freq: Every day | ORAL | Status: DC
  Administered 2018-07-04 – 2018-07-08 (×5): 1 via ORAL
  Filled 2018-07-04 (×5): qty 1

## 2018-07-04 MED ORDER — METFORMIN HCL ER 500 MG PO TB24
1000.0000 mg | ORAL_TABLET | Freq: Two times a day (BID) | ORAL | Status: DC
  Administered 2018-07-04 – 2018-07-08 (×9): 1000 mg via ORAL
  Filled 2018-07-04 (×10): qty 2

## 2018-07-04 MED ORDER — CALCIUM CARBONATE ANTACID 500 MG PO CHEW: 2.0000 | CHEWABLE_TABLET | ORAL | Status: DC | PRN

## 2018-07-04 MED ORDER — INSULIN ASPART 100 UNIT/ML ~~LOC~~ SOLN
0.0000 [IU] | Freq: Three times a day (TID) | SUBCUTANEOUS | Status: DC
  Administered 2018-07-04 – 2018-07-05 (×3): 7 [IU] via SUBCUTANEOUS
  Administered 2018-07-05: 4 [IU] via SUBCUTANEOUS
  Administered 2018-07-05: 7 [IU] via SUBCUTANEOUS
  Administered 2018-07-06: 3 [IU] via SUBCUTANEOUS
  Administered 2018-07-06: 4 [IU] via SUBCUTANEOUS
  Administered 2018-07-06: 7 [IU] via SUBCUTANEOUS

## 2018-07-04 MED ORDER — SODIUM CHLORIDE 0.9 % IV SOLN
2.0000 g | Freq: Four times a day (QID) | INTRAVENOUS | Status: AC
  Administered 2018-07-04 – 2018-07-06 (×8): 2 g via INTRAVENOUS
  Filled 2018-07-04 (×8): qty 2

## 2018-07-04 MED ORDER — INSULIN ASPART 100 UNIT/ML ~~LOC~~ SOLN: 0.0000 [IU] | Freq: Three times a day (TID) | SUBCUTANEOUS | Status: DC

## 2018-07-04 MED ORDER — ACETAMINOPHEN 325 MG PO TABS
650.0000 mg | ORAL_TABLET | ORAL | Status: DC | PRN
  Administered 2018-07-05 – 2018-07-09 (×2): 650 mg via ORAL
  Filled 2018-07-04 (×2): qty 2

## 2018-07-04 MED ORDER — DOCUSATE SODIUM 100 MG PO CAPS
100.0000 mg | ORAL_CAPSULE | Freq: Every day | ORAL | Status: DC
  Administered 2018-07-04 – 2018-07-08 (×5): 100 mg via ORAL
  Filled 2018-07-04 (×5): qty 1

## 2018-07-04 MED ORDER — INSULIN ASPART 100 UNIT/ML ~~LOC~~ SOLN
13.0000 [IU] | Freq: Three times a day (TID) | SUBCUTANEOUS | Status: DC
  Administered 2018-07-04 – 2018-07-06 (×6): 13 [IU] via SUBCUTANEOUS

## 2018-07-04 MED ORDER — LACTATED RINGERS IV SOLN
INTRAVENOUS | Status: DC
  Administered 2018-07-04 – 2018-07-06 (×5): via INTRAVENOUS

## 2018-07-04 MED ORDER — MAGNESIUM SULFATE 40 G IN LACTATED RINGERS - SIMPLE
2.0000 g/h | INTRAVENOUS | Status: AC
  Administered 2018-07-05 – 2018-07-06 (×2): 2 g/h via INTRAVENOUS
  Filled 2018-07-04: qty 40
  Filled 2018-07-04 (×2): qty 500

## 2018-07-04 MED ORDER — ZOLPIDEM TARTRATE 5 MG PO TABS: 5.0000 mg | ORAL_TABLET | Freq: Every evening | ORAL | Status: DC | PRN

## 2018-07-04 NOTE — H&P (Addendum)
Jill Montoya is a 22 y.o. female G1P0 at 33 wks and 0 days presented to the office today for NST. She reports leakage of fluid that started yesterday evening around 6 pm and has continued since. She report clear fluid that runs down her legs at times. She has irregular mild contractions for the last 2 weeks. She has been taking procardia 10 mg every 8 hours for contractions.  + FM . No vaginal bleeding. On exam in the office she grossly ruptured with pooling noted in the vaginal vault on sterile exam. Her pregnancy is complicated by Type 2 diabetes. Her prenatal care is provided by Dr. Myna HidalgoJennifer Ozan with [facility].    OB History    Gravida  1   Para      Term      Preterm      AB      Living  0     SAB      TAB      Ectopic      Multiple      Live Births             Past Medical History:  Diagnosis Date  . Chlamydia   . Diabetes mellitus without complication [facility](HCC)    Past Surgical History:  Procedure Laterality Date  . NO PAST SURGERIES     Family History: family history includes Diabetes in her father and mother. Social History:  reports that she has never smoked. She has never used smokeless tobacco. She reports that she does not drink alcohol or use drugs.   Review of Systems  Constitutional: Negative.   HENT: Negative.   Eyes: Negative.   Respiratory: Negative.   Cardiovascular: Negative.   Gastrointestinal: Negative.   Genitourinary: Negative.   Musculoskeletal: Negative.   Skin: Negative.   Neurological: Negative.   Endo/Heme/Allergies: Negative.   Psychiatric/Behavioral: Negative.    Maternal Medical History:  Reason for admission: Rupture of membranes.   Contractions: Onset was 1 week or more ago.   Frequency: irregular.   Perceived severity is mild.    Fetal activity: Perceived fetal activity is normal.    Prenatal Complications - Diabetes: type 2. Diabetes is managed by insulin injections and oral agent (monotherapy).        Blood  pressure 120/68, pulse 100, temperature 97.9 F (36.6 C), temperature source Oral, resp. rate 18, height 5\' 4"  (1.626 m), weight 97.5 kg, last menstrual period 11/15/2017, SpO2 98 %. Maternal Exam:  Uterine Assessment: Contraction strength is mild.  Contraction frequency is irregular.   Abdomen: Patient reports no abdominal tenderness. Introitus: Normal vulva. Normal vagina.  Amniotic fluid character: clear.     Fetal Exam Fetal State Assessment: Category I - tracings are normal.     Physical Exam  Vitals reviewed. Constitutional: She is oriented to person, place, and time. She appears well-developed and well-nourished.  HENT:  Head: Normocephalic and atraumatic.  Eyes: Pupils are equal, round, and reactive to light. Conjunctivae are normal.  Neck: Normal range of motion. Neck supple.  Cardiovascular: Normal rate and regular rhythm.  Respiratory: Effort normal and breath sounds normal.  GI: There is no tenderness.  Genitourinary: Vagina normal.  Musculoskeletal: Normal range of motion. She exhibits no edema.  Neurological: She is alert and oriented to person, place, and time. She has normal reflexes.  Skin: Skin is warm and dry.  Psychiatric: She has a normal mood and affect.    Sterile speculum exam .. Pooling noted in vaginal vault..[name] Kitchen  Cervix is visually 1 cm...   Prenatal labs: ABO, Rh: --/--/B POS, B POS Performed at [facility], [address] [place], [place] [address]  [phone](08/19 1118) Antibody: NEG (08/19 1118) Rubella:  Immune  RPR:   nonreactive / HBsAg:   Negative  HIV:   Negative  GBS:   Positive in urine   Assessment/Plan: 33 wks and 0 days with PPROM/Preterm contractiosn/ Type 2 diabetes - Start latency antibiotics. Ampicillin IV for 48 hours followed by amoxicillin to complete 7days of therapy/ azithromycin  -betamethasone for fetal lung maturity  - Magnesium for tocoloysis for 48 hours   - plan deliver if pt develops chorioamnionitis or at 34 wks  -  NICU consult -U/s for presentation and AFI -Metformin XR 500 mg qid and Humolog 13 units with meals -CBG fasting and postprandial.  Dr. Charlotta Newtonzan to assume care 07/05/2018 at 7 am   Rustin Erhart J. 07/04/2018, 8:13 PM

## 2018-07-05 ENCOUNTER — Ambulatory Visit: Payer: 59 | Admitting: Endocrinology

## 2018-07-05 ENCOUNTER — Encounter (HOSPITAL_COMMUNITY): Payer: Self-pay

## 2018-07-05 ENCOUNTER — Ambulatory Visit (HOSPITAL_COMMUNITY)
Admission: RE | Admit: 2018-07-05 | Discharge: 2018-07-05 | Disposition: A | Discharge status: Home / Self Care | Payer: 59 | Source: Ambulatory Visit

## 2018-07-05 LAB — GLUCOSE, CAPILLARY
Glucose-Capillary: 151 mg/dL — ABNORMAL HIGH (ref 70–99)
Glucose-Capillary: 232 mg/dL — ABNORMAL HIGH (ref 70–99)
Glucose-Capillary: 229 mg/dL — ABNORMAL HIGH (ref 70–99)
Glucose-Capillary: 221 mg/dL — ABNORMAL HIGH (ref 70–99)

## 2018-07-05 LAB — CULTURE, OB URINE: Culture: NO GROWTH

## 2018-07-05 LAB — GC/CHLAMYDIA PROBE AMP (~~LOC~~) NOT AT ARMC
Chlamydia: NEGATIVE
Neisseria Gonorrhea: NEGATIVE

## 2018-07-05 LAB — CULTURE, BETA STREP (GROUP B ONLY)

## 2018-07-05 NOTE — Progress Notes (Signed)
Antepartum-PPROM, HD#1  S: Patient resting comfortably in bed.  Denies regular contractions.  Still leaking some fluid.  No vaginal bleeding, +fetal movement.  No acute complaints  O: BP 121/76   Pulse (!) 103   Temp 98 F (36.7 C) (Oral)   Resp 18   Ht 5\' 4"  (1.626 m)   Wt 97.5 kg   LMP 11/15/2017 (Exact Date)   SpO2 98%   BMI 36.90 kg/m   Gen: NAD CV: RRR Lungs: CTAB Abd: soft, gravid, non-tender Ext: no edema, no calf tenderness  Results for orders placed or performed during the hospital encounter of 07/04/18 (from the past 24 hour(s))  CBC on admission     Status: Abnormal   Collection Time: 07/04/18 11:18 AM  Result Value Ref Range   WBC 8.5 4.0 - 10.5 K/uL   RBC 3.91 3.87 - 5.11 MIL/uL   Hemoglobin 11.8 (L) 12.0 - 15.0 g/dL   HCT 16.134.3 (L) 09.636.0 - 04.546.0 %   MCV 87.7 78.0 - 100.0 fL   MCH 30.2 26.0 - 34.0 pg   MCHC 34.4 30.0 - 36.0 g/dL   RDW 40.912.8 81.111.5 - 91.415.5 %   Platelets 297 150 - 400 K/uL  Type and screen     Status: None   Collection Time: 07/04/18 11:18 AM  Result Value Ref Range   ABO/RH(D) B POS    Antibody Screen NEG    Sample Expiration      07/07/2018 Performed at [facility], [address] [place], [place] [address]   ABO/Rh     Status: None   Collection Time: 07/04/18 11:18 AM  Result Value Ref Range   ABO/RH(D)      B POS Performed at [facility], [address] [place], [place] [address]   Glucose, capillary     Status: Abnormal   Collection Time: 07/04/18  1:10 PM  Result Value Ref Range   Glucose-Capillary 124 (H) 70 - 99 mg/dL   Comment 1 Notify RN    Comment 2 Document in Chart   Glucose, capillary     Status: Abnormal   Collection Time: 07/04/18  3:20 PM  Result Value Ref Range   Glucose-Capillary 174 (H) 70 - 99 mg/dL   Comment 1 Notify RN    Comment 2 Document in Chart   Urinalysis, Routine w reflex microscopic     Status: Abnormal   Collection Time: 07/04/18  4:40 PM  Result Value Ref Range   Color, Urine YELLOW  YELLOW   APPearance CLEAR CLEAR   Specific Gravity, Urine 1.015 1.005 - 1.030   pH 6.0 5.0 - 8.0   Glucose, UA >=500 (A) NEGATIVE mg/dL   Hgb urine dipstick NEGATIVE NEGATIVE   Bilirubin Urine NEGATIVE NEGATIVE   Ketones, ur 80 (A) NEGATIVE mg/dL   Protein, ur NEGATIVE NEGATIVE mg/dL   Nitrite NEGATIVE NEGATIVE   Leukocytes, UA TRACE (A) NEGATIVE   RBC / HPF 0-5 0 - 5 RBC/hpf   WBC, UA 6-10 0 - 5 WBC/hpf   Bacteria, UA NONE SEEN NONE SEEN   Squamous Epithelial / LPF 0-5 0 - 5   Mucus PRESENT    Hyaline Casts, UA PRESENT   Glucose, capillary     Status: Abnormal   Collection Time: 07/04/18  6:22 PM  Result Value Ref Range   Glucose-Capillary 176 (H) 70 - 99 mg/dL   Comment 1 Notify RN    Comment 2 Document in Chart   Glucose, capillary  Status: Abnormal   Collection Time: 07/04/18  8:28 PM  Result Value Ref Range   Glucose-Capillary 241 (H) 70 - 99 mg/dL   A/P: 16XW22yo R6E4@G1P0@ 8064w1d admitted for PPROM- HD# 1  1) PPROM:  -on latency antibiotics  -on IV Magnesium for tocolysis  -2nd steroid shot later today  -no evidence of infection or labor  -GBS positive 2) T2DM:  -currently on metformin and novolog 13u with meals  -sliding scale insulin for meal coverage 3) Fetal well being  -NICHD- Cat. I, plan to transition to q shift monitoring  -BPP weekly  -NICU consulted  Plan for delivery at 34wks unless fetal or maternal indications prior to that date  Myna HidalgoJennifer Yeudiel Mateo, DO [phone] (cell) [phone] (office)

## 2018-07-06 LAB — GLUCOSE, CAPILLARY
Glucose-Capillary: 187 mg/dL — ABNORMAL HIGH (ref 70–99)
Glucose-Capillary: 203 mg/dL — ABNORMAL HIGH (ref 70–99)
Glucose-Capillary: 132 mg/dL — ABNORMAL HIGH (ref 70–99)
Glucose-Capillary: 191 mg/dL — ABNORMAL HIGH (ref 70–99)

## 2018-07-06 LAB — RPR: RPR Ser Ql: NONREACTIVE

## 2018-07-06 LAB — MAGNESIUM: Magnesium: 4.5 mg/dL — ABNORMAL HIGH (ref 1.7–2.4)

## 2018-07-06 MED ORDER — INSULIN ASPART 100 UNIT/ML ~~LOC~~ SOLN: 0.0000 [IU] | Freq: Three times a day (TID) | SUBCUTANEOUS | Status: DC

## 2018-07-06 MED ORDER — INSULIN NPH (HUMAN) (ISOPHANE) 100 UNIT/ML ~~LOC~~ SUSP
15.0000 [IU] | Freq: Two times a day (BID) | SUBCUTANEOUS | Status: DC
  Administered 2018-07-06 – 2018-07-08 (×5): 15 [IU] via SUBCUTANEOUS
  Filled 2018-07-06: qty 10

## 2018-07-06 MED ORDER — INSULIN ASPART 100 UNIT/ML ~~LOC~~ SOLN
15.0000 [IU] | Freq: Three times a day (TID) | SUBCUTANEOUS | Status: DC
  Administered 2018-07-06: 15 [IU] via SUBCUTANEOUS

## 2018-07-06 MED ORDER — INSULIN ASPART 100 UNIT/ML ~~LOC~~ SOLN
8.0000 [IU] | Freq: Three times a day (TID) | SUBCUTANEOUS | Status: DC
  Administered 2018-07-06 – 2018-07-08 (×6): 8 [IU] via SUBCUTANEOUS

## 2018-07-06 MED ORDER — INSULIN ASPART 100 UNIT/ML ~~LOC~~ SOLN
0.0000 [IU] | Freq: Three times a day (TID) | SUBCUTANEOUS | Status: DC
  Administered 2018-07-07 (×3): 1 [IU] via SUBCUTANEOUS
  Administered 2018-07-08: 3 [IU] via SUBCUTANEOUS
  Administered 2018-07-08 (×2): 2 [IU] via SUBCUTANEOUS

## 2018-07-06 NOTE — Progress Notes (Signed)
Inpatient Diabetes Program Recommendations  AACE/ADA: New Consensus Statement on Inpatient Glycemic Control (2015)  Target Ranges:  Prepandial:   less than 140 mg/dL      Peak postprandial:   less than 180 mg/dL (1-2 hours)      Critically ill patients:  140 - 180 mg/dL   Lab Results  Component Value Date   GLUCAP 203 (H) 07/06/2018   HGBA1C 6.7 (A) 06/03/2018    Review of Glycemic Control Results for Jill Montoya, Jill Montoya (MRN [id number]) as of 07/06/2018 15:22  Ref. Range 07/05/2018 15:43 07/05/2018 20:20 07/06/2018 08:19 07/06/2018 10:19  Glucose-Capillary Latest Ref Range: 70 - 99 mg/dL 409229 (H) 811221 (H) 914187 (H) 203 (H)   Diabetes history: GDM Outpatient Diabetes medications: none Current orders for Inpatient glycemic control: Novolog 15 units TID, Novolog 0-20 units TID, Metformin 1000 mg BID  Inpatient Diabetes Program Recommendations:    Noted patient received BMZ x 2 on 8/20 & 8/21, resulting in increases to BS following administration. However, total of short acting insulin over last 24 hours (106 units) reflects need for adjustments.   Consider changing to the following:  NPH 15 units QAM and QHS (based off weeks gestation/per kg weight).   Decreasing meal coverage to Novolog 8 units TID (asusming that patient consumes >50% of meal)(based off of 1 units per 6 grams of CHO) and decreasing correction to 0-14 units of Novolog under the diabetic pregnant patient order set.   Anticipate BS to begin to trend down, given completed course of steroids.  Will continue to follow.   Thanks, Lujean RaveLauren Demico Ploch, MSN, RNC-OB Diabetes Coordinator [phone] (8a-5p)

## 2018-07-06 NOTE — Progress Notes (Signed)
Antepartum-PPROM, HD#2  S: Patient resting comfortably in bed.  Overnight she was placed on continuous monitoring for irregular contractions.  Pt reports the pain as a 1/10- states she feels them, but they are not painful.  Denies regular/painful contractions.  Still leaking some fluid.  No vaginal bleeding, +fetal movement.  No acute complaints  O: BP 114/62 (BP Location: Right Arm)   Pulse 83   Temp 98.5 F (36.9 C) (Oral)   Resp 18   Ht 5\' 4"  (1.626 m)   Wt 97.5 kg   LMP 11/15/2017 (Exact Date)   SpO2 98%   BMI 36.90 kg/m   Gen: NAD CV: RRR Lungs: CTAB Abd: soft, gravid, non-tender Ext: no edema, no calf tenderness  Results for orders placed or performed during the hospital encounter of 07/04/18 (from the past 24 hour(s))  Glucose, capillary     Status: Abnormal   Collection Time: 07/05/18 10:46 AM  Result Value Ref Range   Glucose-Capillary 232 (H) 70 - 99 mg/dL   Comment 1 Notify RN    Comment 2 Document in Chart   Glucose, capillary     Status: Abnormal   Collection Time: 07/05/18  3:43 PM  Result Value Ref Range   Glucose-Capillary 229 (H) 70 - 99 mg/dL  Glucose, capillary     Status: Abnormal   Collection Time: 07/05/18  8:20 PM  Result Value Ref Range   Glucose-Capillary 221 (H) 70 - 99 mg/dL  Magnesium     Status: Abnormal   Collection Time: 07/06/18  6:28 AM  Result Value Ref Range   Magnesium 4.5 (H) 1.7 - 2.4 mg/dL  Glucose, capillary     Status: Abnormal   Collection Time: 07/06/18  8:19 AM  Result Value Ref Range   Glucose-Capillary 187 (H) 70 - 99 mg/dL   Comment 1 Notify RN    Comment 2 Document in Chart    A/P: 22yo G1P0@ 6137w2d admitted for PPROM- HD# 2  1) PPROM:  -on latency antibiotics  -tocolysis to be discontinued today @ 1300  -s/p betamethasone course  -no evidence of infection or labor  -GBS positive 2) T2DM:  -currently on metformin and novolog increased to 15u per meal  -sliding scale insulin for meal coverage 3) Fetal well  being  -NICHD- Cat. I, for now continued monitoring -BPP weekly  -NICU consulted  Plan for delivery at 34wks unless fetal or maternal indications prior to that date  Myna HidalgoJennifer Aleksei Goodlin, DO [phone] (cell) [phone] (office)

## 2018-07-06 NOTE — Progress Notes (Signed)
[facility] having ctx 1.5-4 mins pt comfortable not feeling ctx. Pt not bleeding, continues to leak clear fld.   Will continue to monitor and update if pt uncomfortable or bleeding or any problems.

## 2018-07-07 ENCOUNTER — Inpatient Hospital Stay (HOSPITAL_BASED_OUTPATIENT_CLINIC_OR_DEPARTMENT_OTHER): Payer: 59

## 2018-07-07 DIAGNOSIS — E119 Type 2 diabetes mellitus without complications: Secondary | ICD-10-CM | POA: Diagnosis not present

## 2018-07-07 DIAGNOSIS — Z794 Long term (current) use of insulin: Secondary | ICD-10-CM | POA: Diagnosis not present

## 2018-07-07 DIAGNOSIS — O42913 Preterm premature rupture of membranes, unspecified as to length of time between rupture and onset of labor, third trimester: Secondary | ICD-10-CM | POA: Diagnosis not present

## 2018-07-07 DIAGNOSIS — O2412 Pre-existing diabetes mellitus, type 2, in childbirth: Secondary | ICD-10-CM | POA: Diagnosis not present

## 2018-07-07 DIAGNOSIS — Z3A33 33 weeks gestation of pregnancy: Secondary | ICD-10-CM

## 2018-07-07 DIAGNOSIS — O99213 Obesity complicating pregnancy, third trimester: Secondary | ICD-10-CM

## 2018-07-07 DIAGNOSIS — O24113 Pre-existing diabetes mellitus, type 2, in pregnancy, third trimester: Secondary | ICD-10-CM

## 2018-07-07 DIAGNOSIS — O99824 Streptococcus B carrier state complicating childbirth: Secondary | ICD-10-CM | POA: Diagnosis not present

## 2018-07-07 LAB — GLUCOSE, CAPILLARY
Glucose-Capillary: 97 mg/dL (ref 70–99)
Glucose-Capillary: 115 mg/dL — ABNORMAL HIGH (ref 70–99)
Glucose-Capillary: 120 mg/dL — ABNORMAL HIGH (ref 70–99)
Glucose-Capillary: 108 mg/dL — ABNORMAL HIGH (ref 70–99)
Glucose-Capillary: 141 mg/dL — ABNORMAL HIGH (ref 70–99)

## 2018-07-07 MED ORDER — LACTATED RINGERS IV BOLUS
1000.0000 mL | Freq: Once | INTRAVENOUS | Status: AC
  Administered 2018-07-07: 1000 mL via INTRAVENOUS

## 2018-07-07 NOTE — Progress Notes (Signed)
At bedside due to increased contractions noted on monitoring.  Pt states that she may feel a strong one every now and then, but not regularly.  O: BP 109/74 (BP Location: Left Arm)   Pulse 84   Temp 98.5 F (36.9 C) (Oral)   Resp 16   Ht 5\' 4"  (1.626 m)   Wt 97.5 kg   LMP 11/15/2017 (Exact Date)   SpO2 98%   BMI 36.90 kg/m   FHT: 180, moderate variability, +accels, no decels Toco: q6-217min SSE: Appears ~ 2cm dilated SVE: 2/25/-3, vertex on exam  Plan for IV fluid bolus and continued monitoring.  Continue management as previously outlined.  Myna HidalgoJennifer Caroljean Monsivais, DO [phone] (cell) [phone] (office)

## 2018-07-07 NOTE — Progress Notes (Signed)
Antepartum-PPROM-7142w3d HD#3  S: Patient resting comfortably in bed.  Reports no issues overnight- still having occasional irregular contractions that she reports as a 1/10.  Denies regular/painful contractions.  Still leaking some fluid.  No vaginal bleeding, +fetal movement.  No acute complaints  O: BP 118/63   Pulse 88   Temp 98.5 F (36.9 C) (Oral)   Resp 18   Ht 5\' 4"  (1.626 m)   Wt 97.5 kg   LMP 11/15/2017 (Exact Date)   SpO2 98%   BMI 36.90 kg/m   Gen: NAD CV: RRR Lungs: CTAB Abd: soft, gravid, non-tender Ext: no edema, no calf tenderness  Results for orders placed or performed during the hospital encounter of 07/04/18 (from the past 24 hour(s))  Glucose, capillary     Status: Abnormal   Collection Time: 07/06/18  8:19 AM  Result Value Ref Range   Glucose-Capillary 187 (H) 70 - 99 mg/dL   Comment 1 Notify RN    Comment 2 Document in Chart   Glucose, capillary     Status: Abnormal   Collection Time: 07/06/18 10:19 AM  Result Value Ref Range   Glucose-Capillary 203 (H) 70 - 99 mg/dL  Glucose, capillary     Status: Abnormal   Collection Time: 07/06/18  3:21 PM  Result Value Ref Range   Glucose-Capillary 132 (H) 70 - 99 mg/dL  Glucose, capillary     Status: Abnormal   Collection Time: 07/06/18  8:14 PM  Result Value Ref Range   Glucose-Capillary 191 (H) 70 - 99 mg/dL   A/P: 9WJ22yo X9J4@G1P0@ 2642w3d admitted for PPROM- HD# 3  1) PPROM:  -on latency antibiotics  -s/p tocolysis  -s/p betamethasone course  -no evidence of infection or labor  -GBS positive 2) T2DM:  -insulin adjusted per diabetes coordinator- now on 15u NPH qam and qhs and 8u Novolog with meals  -sliding scale insulin for meal coverage 3) Fetal well being  -NICHD- Cat. I, q shift monitoring   -BPP weekly- plan to complete today  -NICU consulted  Plan for delivery at 34wks unless fetal or maternal indications prior to that date  Myna HidalgoJennifer Tyria Springer, DO [phone] (cell) [phone] (office)

## 2018-07-08 LAB — GLUCOSE, CAPILLARY
Glucose-Capillary: 107 mg/dL — ABNORMAL HIGH (ref 70–99)
Glucose-Capillary: 153 mg/dL — ABNORMAL HIGH (ref 70–99)
Glucose-Capillary: 153 mg/dL — ABNORMAL HIGH (ref 70–99)
Glucose-Capillary: 183 mg/dL — ABNORMAL HIGH (ref 70–99)

## 2018-07-08 MED ORDER — LACTATED RINGERS IV SOLN
INTRAVENOUS | Status: DC
  Administered 2018-07-08 (×3): via INTRAVENOUS

## 2018-07-08 MED ORDER — LACTATED RINGERS IV BOLUS
1000.0000 mL | Freq: Once | INTRAVENOUS | Status: AC
  Administered 2018-07-08: 1000 mL via INTRAVENOUS

## 2018-07-08 NOTE — Progress Notes (Signed)
Pt complaining of UC, monitors applied

## 2018-07-08 NOTE — Progress Notes (Signed)
Pt continues to ctx IV LR bolus started to hydrate pt to space out ctx

## 2018-07-08 NOTE — Progress Notes (Signed)
Pt feeling ctx like she did earlier in the day some are stronger than others.   Dr. Charlotta Newtonzan Notified of ctx 2-4 lasting 40-60 pt states ctx feel like they did earlier today when she was contracting and dr. Charlotta Newtonzan so pt.   Dr. Charlotta Newtonzan encouraged PO hydration and if that doesn't space out or stop ctx give her 1 L of LR over 2 hrs. Will continue to keep on fhr monitor until ctx space or stop.

## 2018-07-08 NOTE — Progress Notes (Signed)
Antepartum-PPROM-6341w4d HD#4  S:  Overnight required IV fluid bolus.  She is still reporting some irregular contractions.  She may note sharp pain on occasion, but nothing regular- maybe a 4/10. Denies regular/painful contractions.  Still leaking some fluid.  No vaginal bleeding, +fetal movement.  No acute complaints  O: BP 124/77 (BP Location: Left Arm)   Pulse 82   Temp 98.2 F (36.8 C) (Oral)   Resp 17   Ht 5\' 4"  (1.626 m)   Wt 97.5 kg   LMP 11/15/2017 (Exact Date)   SpO2 98%   BMI 36.90 kg/m   Gen: NAD CV: RRR Lungs: CTAB Abd: soft, gravid, non-tender Ext: no edema, no calf tenderness  Results for orders placed or performed during the hospital encounter of 07/04/18 (from the past 24 hour(s))  Glucose, capillary     Status: None   Collection Time: 07/07/18  9:40 AM  Result Value Ref Range   Glucose-Capillary 97 70 - 99 mg/dL  Glucose, capillary     Status: Abnormal   Collection Time: 07/07/18 12:31 PM  Result Value Ref Range   Glucose-Capillary 115 (H) 70 - 99 mg/dL  Glucose, capillary     Status: Abnormal   Collection Time: 07/07/18  4:23 PM  Result Value Ref Range   Glucose-Capillary 120 (H) 70 - 99 mg/dL  Glucose, capillary     Status: Abnormal   Collection Time: 07/07/18  6:51 PM  Result Value Ref Range   Glucose-Capillary 108 (H) 70 - 99 mg/dL  Glucose, capillary     Status: Abnormal   Collection Time: 07/07/18  9:25 PM  Result Value Ref Range   Glucose-Capillary 141 (H) 70 - 99 mg/dL   A/P: 16XW22yo R6E4@G1P0@ 5341w4d admitted for PPROM- HD# 4  1) PPROM:  -on latency antibiotics  -s/p tocolysis  -s/p betamethasone course  -no evidence of infection or labor  -GBS positive 2) T2DM:  -sugars better controlled, being followed by diabetic coordinator  -continue on 15u NPH qam and qhs and 8u Novolog with meals  -sliding scale insulin for meal coverage 3) Fetal well being  -NICHD- Cat. I, q shift monitoring- plan to place on monitor this am  -BPP weekly  -NICU  consulted  Plan for delivery at 34wks unless fetal or maternal indications prior to that date  Myna HidalgoJennifer Terin Cragle, DO [phone] (cell) [phone] (office)

## 2018-07-08 NOTE — Progress Notes (Signed)
At bedside due to increased pressure.  Reporting increased back pain and pressure and notes that the contractions feel stronger than before.  Since starting fluids she has noted some improvement, but early today was rating pain 6/10  O: BP (!) 103/56 (BP Location: Right Arm)   Pulse 92   Temp 98.5 F (36.9 C) (Oral)   Resp 17   Ht 5\' 4"  (1.626 m)   Wt 97.5 kg   LMP 11/15/2017 (Exact Date)   SpO2 96%   BMI 36.90 kg/m   FHT: 145, moderate variability, +accels, no decels Toco: irritability noted, occasional contraction SSE: Appears ~ 1-2cm dilated- unchanged from previously  Reassured pt that no further dilation was noted.  Plan to continue with IV fluids for now.  Encouraged pt to change position off her back as needed.  Ok for wheelchair rides if desired  Myna HidalgoJennifer Dominique Calvey, DO [phone] (cell) [phone] (office)

## 2018-07-09 ENCOUNTER — Inpatient Hospital Stay (HOSPITAL_COMMUNITY): Payer: 59 | Admitting: Anesthesiology

## 2018-07-09 ENCOUNTER — Encounter (HOSPITAL_COMMUNITY): Payer: Self-pay | Admitting: Anesthesiology

## 2018-07-09 LAB — GLUCOSE, CAPILLARY
Glucose-Capillary: 77 mg/dL (ref 70–99)
Glucose-Capillary: 100 mg/dL — ABNORMAL HIGH (ref 70–99)
Glucose-Capillary: 89 mg/dL (ref 70–99)
Glucose-Capillary: 93 mg/dL (ref 70–99)
Glucose-Capillary: 89 mg/dL (ref 70–99)
Glucose-Capillary: 74 mg/dL (ref 70–99)
Glucose-Capillary: 100 mg/dL — ABNORMAL HIGH (ref 70–99)
Glucose-Capillary: 83 mg/dL (ref 70–99)
Glucose-Capillary: 105 mg/dL — ABNORMAL HIGH (ref 70–99)

## 2018-07-09 LAB — TYPE AND SCREEN
ABO/RH(D): B POS
Antibody Screen: NEGATIVE

## 2018-07-09 LAB — CBC
HCT: 31.8 % — ABNORMAL LOW (ref 36.0–46.0)
Hemoglobin: 10.5 g/dL — ABNORMAL LOW (ref 12.0–15.0)
MCH: 29.7 pg (ref 26.0–34.0)
MCHC: 33 g/dL (ref 30.0–36.0)
MCV: 90.1 fL (ref 78.0–100.0)
Platelets: 310 10*3/uL (ref 150–400)
RBC: 3.53 MIL/uL — ABNORMAL LOW (ref 3.87–5.11)
RDW: 12.9 % (ref 11.5–15.5)
WBC: 12.3 10*3/uL — ABNORMAL HIGH (ref 4.0–10.5)

## 2018-07-09 MED ORDER — DEXTROSE IN LACTATED RINGERS 5 % IV SOLN: INTRAVENOUS | Status: DC

## 2018-07-09 MED ORDER — FENTANYL CITRATE (PF) 100 MCG/2ML IJ SOLN
50.0000 ug | INTRAMUSCULAR | Status: DC | PRN
  Administered 2018-07-09 (×2): 100 ug via INTRAVENOUS
  Administered 2018-07-09: 50 ug via INTRAVENOUS
  Administered 2018-07-09: 100 ug via INTRAVENOUS
  Filled 2018-07-09 (×4): qty 2

## 2018-07-09 MED ORDER — EPHEDRINE 5 MG/ML INJ
10.0000 mg | INTRAVENOUS | Status: DC | PRN
  Filled 2018-07-09: qty 2

## 2018-07-09 MED ORDER — ACETAMINOPHEN 325 MG PO TABS
650.0000 mg | ORAL_TABLET | ORAL | Status: DC | PRN
  Administered 2018-07-09: 650 mg via ORAL
  Filled 2018-07-09: qty 2

## 2018-07-09 MED ORDER — FENTANYL 2.5 MCG/ML BUPIVACAINE 1/10 % EPIDURAL INFUSION (WH - ANES)
14.0000 mL/h | INTRAMUSCULAR | Status: DC | PRN
  Administered 2018-07-09: 14 mL/h via EPIDURAL
  Filled 2018-07-09: qty 100

## 2018-07-09 MED ORDER — SIMETHICONE 80 MG PO CHEW: 80.0000 mg | CHEWABLE_TABLET | ORAL | Status: DC | PRN

## 2018-07-09 MED ORDER — PRENATAL MULTIVITAMIN CH
1.0000 | ORAL_TABLET | Freq: Every day | ORAL | Status: DC
  Administered 2018-07-10: 1 via ORAL
  Filled 2018-07-09: qty 1

## 2018-07-09 MED ORDER — INSULIN ASPART 100 UNIT/ML ~~LOC~~ SOLN
6.0000 [IU] | Freq: Three times a day (TID) | SUBCUTANEOUS | Status: DC
  Administered 2018-07-10 (×3): 6 [IU] via SUBCUTANEOUS

## 2018-07-09 MED ORDER — LACTATED RINGERS IV SOLN
INTRAVENOUS | Status: DC
  Administered 2018-07-09 (×2): via INTRAVENOUS

## 2018-07-09 MED ORDER — OXYTOCIN 40 UNITS IN LACTATED RINGERS INFUSION - SIMPLE MED
2.5000 [IU]/h | INTRAVENOUS | Status: DC
  Administered 2018-07-09: 2.5 [IU]/h via INTRAVENOUS
  Filled 2018-07-09: qty 1000

## 2018-07-09 MED ORDER — INSULIN LISPRO 100 UNIT/ML (KWIKPEN): 6.0000 [IU] | PEN_INJECTOR | Freq: Three times a day (TID) | SUBCUTANEOUS | Status: DC

## 2018-07-09 MED ORDER — SENNOSIDES-DOCUSATE SODIUM 8.6-50 MG PO TABS
2.0000 | ORAL_TABLET | ORAL | Status: DC
  Administered 2018-07-10 (×2): 2 via ORAL
  Filled 2018-07-09 (×2): qty 2

## 2018-07-09 MED ORDER — PHENYLEPHRINE 40 MCG/ML (10ML) SYRINGE FOR IV PUSH (FOR BLOOD PRESSURE SUPPORT)
80.0000 ug | PREFILLED_SYRINGE | INTRAVENOUS | Status: DC | PRN
  Filled 2018-07-09: qty 5

## 2018-07-09 MED ORDER — OXYTOCIN 40 UNITS IN LACTATED RINGERS INFUSION - SIMPLE MED
1.0000 m[IU]/min | INTRAVENOUS | Status: DC
  Administered 2018-07-09: 2 m[IU]/min via INTRAVENOUS

## 2018-07-09 MED ORDER — OXYCODONE HCL 5 MG PO TABS: 5.0000 mg | ORAL_TABLET | ORAL | Status: DC | PRN

## 2018-07-09 MED ORDER — LACTATED RINGERS IV BOLUS
1000.0000 mL | Freq: Once | INTRAVENOUS | Status: AC
  Administered 2018-07-09: 1000 mL via INTRAVENOUS

## 2018-07-09 MED ORDER — LIDOCAINE HCL (PF) 1 % IJ SOLN
INTRAMUSCULAR | Status: DC | PRN
  Administered 2018-07-09: 5 mL via EPIDURAL
  Administered 2018-07-09: 7 mL via EPIDURAL

## 2018-07-09 MED ORDER — DIPHENHYDRAMINE HCL 25 MG PO CAPS: 25.0000 mg | ORAL_CAPSULE | Freq: Four times a day (QID) | ORAL | Status: DC | PRN

## 2018-07-09 MED ORDER — OXYTOCIN BOLUS FROM INFUSION
500.0000 mL | Freq: Once | INTRAVENOUS | Status: AC
  Administered 2018-07-09: 500 mL via INTRAVENOUS

## 2018-07-09 MED ORDER — PENICILLIN G 3 MILLION UNITS IVPB - SIMPLE MED
3.0000 10*6.[IU] | INTRAVENOUS | Status: DC
  Administered 2018-07-09 (×3): 3 10*6.[IU] via INTRAVENOUS
  Filled 2018-07-09: qty 100
  Filled 2018-07-09: qty 3
  Filled 2018-07-09 (×3): qty 100

## 2018-07-09 MED ORDER — PHENYLEPHRINE 40 MCG/ML (10ML) SYRINGE FOR IV PUSH (FOR BLOOD PRESSURE SUPPORT)
80.0000 ug | PREFILLED_SYRINGE | INTRAVENOUS | Status: DC | PRN
  Filled 2018-07-09: qty 10
  Filled 2018-07-09: qty 5

## 2018-07-09 MED ORDER — METFORMIN HCL ER 500 MG PO TB24
1000.0000 mg | ORAL_TABLET | Freq: Two times a day (BID) | ORAL | Status: DC
  Administered 2018-07-10 – 2018-07-11 (×3): 1000 mg via ORAL
  Filled 2018-07-09 (×3): qty 2

## 2018-07-09 MED ORDER — DIBUCAINE 1 % RE OINT: 1.0000 "application " | TOPICAL_OINTMENT | RECTAL | Status: DC | PRN

## 2018-07-09 MED ORDER — OXYCODONE-ACETAMINOPHEN 5-325 MG PO TABS: 1.0000 | ORAL_TABLET | ORAL | Status: DC | PRN

## 2018-07-09 MED ORDER — OXYCODONE-ACETAMINOPHEN 5-325 MG PO TABS: 2.0000 | ORAL_TABLET | ORAL | Status: DC | PRN

## 2018-07-09 MED ORDER — SOD CITRATE-CITRIC ACID 500-334 MG/5ML PO SOLN: 30.0000 mL | ORAL | Status: DC | PRN

## 2018-07-09 MED ORDER — DIPHENHYDRAMINE HCL 50 MG/ML IJ SOLN: 12.5000 mg | INTRAMUSCULAR | Status: DC | PRN

## 2018-07-09 MED ORDER — IBUPROFEN 600 MG PO TABS
600.0000 mg | ORAL_TABLET | Freq: Four times a day (QID) | ORAL | Status: DC
  Administered 2018-07-10 – 2018-07-11 (×5): 600 mg via ORAL
  Filled 2018-07-09 (×5): qty 1

## 2018-07-09 MED ORDER — OXYCODONE HCL 5 MG PO TABS: 10.0000 mg | ORAL_TABLET | ORAL | Status: DC | PRN

## 2018-07-09 MED ORDER — SODIUM CHLORIDE 0.9 % IV SOLN
INTRAVENOUS | Status: DC
  Filled 2018-07-09: qty 1

## 2018-07-09 MED ORDER — LACTATED RINGERS IV SOLN: 500.0000 mL | INTRAVENOUS | Status: DC | PRN

## 2018-07-09 MED ORDER — SODIUM CHLORIDE 0.9 % IV SOLN
5.0000 10*6.[IU] | Freq: Once | INTRAVENOUS | Status: AC
  Administered 2018-07-09: 5 10*6.[IU] via INTRAVENOUS
  Filled 2018-07-09: qty 5

## 2018-07-09 MED ORDER — ZOLPIDEM TARTRATE 5 MG PO TABS: 5.0000 mg | ORAL_TABLET | Freq: Every evening | ORAL | Status: DC | PRN

## 2018-07-09 MED ORDER — LACTATED RINGERS IV SOLN
500.0000 mL | Freq: Once | INTRAVENOUS | Status: AC
  Administered 2018-07-09: 500 mL via INTRAVENOUS

## 2018-07-09 MED ORDER — ONDANSETRON HCL 4 MG/2ML IJ SOLN: 4.0000 mg | INTRAMUSCULAR | Status: DC | PRN

## 2018-07-09 MED ORDER — COCONUT OIL OIL: 1.0000 "application " | TOPICAL_OIL | Status: DC | PRN

## 2018-07-09 MED ORDER — ACETAMINOPHEN 325 MG PO TABS: 650.0000 mg | ORAL_TABLET | ORAL | Status: DC | PRN

## 2018-07-09 MED ORDER — WITCH HAZEL-GLYCERIN EX PADS: 1.0000 "application " | MEDICATED_PAD | CUTANEOUS | Status: DC | PRN

## 2018-07-09 MED ORDER — LIDOCAINE HCL (PF) 1 % IJ SOLN
30.0000 mL | INTRAMUSCULAR | Status: DC | PRN
  Administered 2018-07-09: 30 mL via SUBCUTANEOUS
  Filled 2018-07-09: qty 30

## 2018-07-09 MED ORDER — ONDANSETRON HCL 4 MG PO TABS: 4.0000 mg | ORAL_TABLET | ORAL | Status: DC | PRN

## 2018-07-09 MED ORDER — ONDANSETRON HCL 4 MG/2ML IJ SOLN: 4.0000 mg | Freq: Four times a day (QID) | INTRAMUSCULAR | Status: DC | PRN

## 2018-07-09 MED ORDER — HYDROXYZINE HCL 50 MG PO TABS
50.0000 mg | ORAL_TABLET | Freq: Four times a day (QID) | ORAL | Status: DC | PRN
  Filled 2018-07-09: qty 1

## 2018-07-09 MED ORDER — BENZOCAINE-MENTHOL 20-0.5 % EX AERO: 1.0000 "application " | INHALATION_SPRAY | CUTANEOUS | Status: DC | PRN

## 2018-07-09 NOTE — Progress Notes (Signed)
[facility].  Report given to Reginold AgentEmily Rothermel, RN.  IV infusing left hand.

## 2018-07-09 NOTE — Progress Notes (Signed)
  Subjective: Pt is comfortable with her epidural.   Objective: BP (!) 126/94   Pulse (!) 111   Temp 99 F (37.2 C) (Oral)   Resp 18   Ht 5\' 4"  (1.626 m)   Wt 97.5 kg   LMP 11/15/2017 (Exact Date)   SpO2 96%   BMI 36.90 kg/m  I/O last 3 completed shifts: In: 2318.1 [I.V.:1318.1; IV Piggyback:1000] Out: -  Total I/O In: -  Out: 750 [Urine:750]  FHT:  FHR: 130 bpm, variability: moderate,  accelerations:  Present,  decelerations:  Absent UC:   regular, every 2-3 minutes SVE: 8/100/0 Labs: Lab Results  Component Value Date   WBC 12.3 (H) 07/09/2018   HGB 10.5 (L) 07/09/2018   HCT 31.8 (L) 07/09/2018   MCV 90.1 07/09/2018   PLT 310 07/09/2018    Assessment / Plan: Arrest in active phase of labor  Labor: start pitocin for augmentation  Preeclampsia:  NA Fetal Wellbeing:  Category I Pain Control:  Epidural I/D:  Penicillin Anticipated MOD:  NSVD  Saralynn Langhorst J. 07/09/2018, 5:04 PM

## 2018-07-09 NOTE — Consult Note (Signed)
Neonatal Consultation: Requested WK:HIPR Reason: expected preterm delivery  I met with [name] today.  She was admitted on 19 August with a history of probable rupture of membranes.  Her pregnancy was complicated by type 2 diabetes.  Over the next couple of days labor ensued, and she is expected to deliver shortly.  I met with her in the presence of her physician Dr. Landry Mellow, and explained the expected medical course of the baby at this gestation in the intensive care unit, primarily requiring support with feeding.  I also explained that in some cases respiratory support was required but that typically this only meant noninvasive oxygen support.  I recommended that we be allowed to feed the baby with donor breastmilk until her milk comes in and she is able to supply enough breastmilk.  We talked about the length of stay one would expect.  She had no further questions.  Despina Hidden MD

## 2018-07-09 NOTE — Anesthesia Preprocedure Evaluation (Signed)
Anesthesia Evaluation  Patient identified by MRN, date of birth, ID band Patient awake    Reviewed: Allergy & Precautions, H&P , Patient's Chart, lab work & pertinent test results  Airway Mallampati: II  TM Distance: >3 FB Neck ROM: full    Dental no notable dental hx. (+) Teeth Intact   Pulmonary neg pulmonary ROS,    Pulmonary exam normal breath sounds clear to auscultation       Cardiovascular hypertension, Pt. on medications Normal cardiovascular exam Rhythm:regular Rate:Normal     Neuro/Psych negative neurological ROS  negative psych ROS   GI/Hepatic negative GI ROS, Neg liver ROS,   Endo/Other  negative endocrine ROSdiabetes, Type 2, Oral Hypoglycemic Agents, Insulin Dependent  Renal/GU negative Renal ROS  negative genitourinary   Musculoskeletal negative musculoskeletal ROS (+)   Abdominal (+) + obese,   Peds  Hematology negative hematology ROS (+)   Anesthesia Other Findings   Reproductive/Obstetrics (+) Pregnancy                             Anesthesia Physical Anesthesia Plan  ASA: II  Anesthesia Plan: Epidural   Post-op Pain Management:    Induction:   PONV Risk Score and Plan:   Airway Management Planned:   Additional Equipment:   Intra-op Plan:   Post-operative Plan:   Informed Consent: I have reviewed the patients History and Physical, chart, labs and discussed the procedure including the risks, benefits and alternatives for the proposed anesthesia with the patient or authorized representative who has indicated his/her understanding and acceptance.     Plan Discussed with:   Anesthesia Plan Comments:         Anesthesia Quick Evaluation

## 2018-07-09 NOTE — Anesthesia Pain Management Evaluation Note (Signed)
  [facility] Pain Management Visit Note  Patient: Jill Montoya, 22 y.o., female  "Hello I am a member of the anesthesia team at [facility]. We have an anesthesia team available at all times to provide care throughout the hospital, including epidural management and anesthesia for C-section. I don't know your plan for the delivery whether it a natural birth, water birth, IV sedation, nitrous supplementation, doula or epidural, but we want to meet your pain goals."   1.Was your pain managed to your expectations on prior hospitalizations?   No prior hospitalizations  2.What is your expectation for pain management during this hospitalization?     IV pain meds  3.How can we help you reach that goal? Support prn   Record the patient's initial score and the patient's pain goal.   Pain: 8  Pain Goal: 6 [facility] wants you to be able to say your pain was always managed very well.  [facility],Jill Montoya 07/09/2018

## 2018-07-09 NOTE — Progress Notes (Signed)
  Subjective: In to assess patient due to reports of involuntary pushing. Pt reports feeling extreme pressure.   Objective: BP (!) 126/94   Pulse (!) 111   Temp 99 F (37.2 C) (Oral)   Resp 18   Ht 5\' 4"  (1.626 m)   Wt 97.5 kg   LMP 11/15/2017 (Exact Date)   SpO2 96%   BMI 36.90 kg/m  I/O last 3 completed shifts: In: 2318.1 [I.V.:1318.1; IV Piggyback:1000] Out: -  Total I/O In: -  Out: 750 [Urine:750]  FHT:  FHR: 130 bpm, variability: moderate,  accelerations:  Present,  decelerations:  Absent UC:   regular, every 2 minutes SVE:   Dilation: Lip/rim Effacement (%): 100 Station: Plus 1 Exam by:: [employer]Cole  Labs: Lab Results  Component Value Date   WBC 12.3 (H) 07/09/2018   HGB 10.5 (L) 07/09/2018   HCT 31.8 (L) 07/09/2018   MCV 90.1 07/09/2018   PLT 310 07/09/2018    Assessment / Plan: 33 wks and 5 days PPROM/ PTL   Labor: Progressing normally Preeclampsia:  NA Fetal Wellbeing:  Category I Pain Control:  Epidural I/D:  Penicillin Anticipated MOD:  NSVD  Ciarra Braddy J. 07/09/2018, 5:06 PM

## 2018-07-09 NOTE — Anesthesia Procedure Notes (Signed)
Epidural Patient location during procedure: OB Start time: 07/09/2018 12:06 PM End time: 07/09/2018 12:09 PM  Staffing Anesthesiologist: Leilani AbleHatchett, Wilton Thrall, MD Performed: anesthesiologist   Preanesthetic Checklist Completed: patient identified, site marked, surgical consent, pre-op evaluation, timeout performed, IV checked, risks and benefits discussed and monitors and equipment checked  Epidural Patient position: sitting Prep: site prepped and draped and DuraPrep Patient monitoring: continuous pulse ox and blood pressure Approach: midline Location: L3-L4 Injection technique: LOR air  Needle:  Needle type: Tuohy  Needle gauge: 17 G Needle length: 9 cm and 9 Needle insertion depth: 8 cm Catheter type: closed end flexible Catheter size: 19 Gauge Catheter at skin depth: 15 cm Test dose: negative and Other  Assessment Sensory level: T9 Events: blood not aspirated, injection not painful, no injection resistance, negative IV test and no paresthesia  Additional Notes Reason for block:procedure for pain

## 2018-07-09 NOTE — Progress Notes (Addendum)
Patient complaining of contractions.  Monitor applied, IV fluid bolus infusing, Tylenol 650mg  given. Dr Richardson Doppole notified

## 2018-07-09 NOTE — Progress Notes (Signed)
Jill Montoya is a 22 y.o. G1P0 at 2359w5d  admitted for PROM  Subjective: Patient reports continued contractions. She has been able to sleep some after fentanyl dosage. Still leaking clear fluid + FM...   Objective: BP 127/79   Pulse 90   Temp 98.4 F (36.9 C) (Oral)   Resp 18   Ht 5\' 4"  (1.626 m)   Wt 97.5 kg   LMP 11/15/2017 (Exact Date)   SpO2 98%   BMI 36.90 kg/m  I/O last 3 completed shifts: In: 2318.1 [I.V.:1318.1; IV Piggyback:1000] Out: -  No intake/output data recorded.  FHT:  FHR: 130 bpm, variability: moderate,  accelerations:  Present,  decelerations:  Absent UC:   regular, every 2 minutes SVE:   Dilation: 7 Effacement (%): 100 Station: 0 Exam by:: dr Richardson Doppcole  Labs: Lab Results  Component Value Date   WBC 8.5 07/04/2018   HGB 11.8 (L) 07/04/2018   HCT 34.3 (L) 07/04/2018   MCV 87.7 07/04/2018   PLT 297 07/04/2018    Assessment / Plan: Spontaneous labor, progressing normally  Labor: Progressing normally Preeclampsia:  NA Fetal Wellbeing:  Category I Pain Control:  IV pain meds and patient interested in nitrous  I/D:  Penicillin for gbs prophylaxis  Anticipated MOD:  NSVD  Jill Montoya. 07/09/2018, 8:59 AM

## 2018-07-09 NOTE — Progress Notes (Signed)
I was called by Dr Richardson Doppole to perform a sterile speculum exam on pt due to increased cxt.   At bedside due to increased pressure and contractions. Pt endorses these cxt feels much worse, pt in bed with family at bedside, grimacing and and pace breathing through cxt.   O: BP 120/78 (BP Location: Left Arm)   Pulse 74   Temp 98.9 F (37.2 C) (Oral)   Resp 18   Ht 5\' 4"  (1.626 m)   Wt 97.5 kg   LMP 11/15/2017 (Exact Date)   SpO2 97%   BMI 36.90 kg/m   FHT: 130, moderate variability, +accels, no decels Toco:212mins, lasting 80 second, moderate to palpate. Maternal perception is painful.  SSE: Appears ~ 2-3cm dilated- but cxt appear to be strong with pt  Permission obtained to perform a SVE SVE: 3.5/80/-3 Pt tolerated exam well.   Dr Richardson Doppole notified. Plan verbalized to transfer pt to LD, Dr Richardson Doppole to place orders.    Jill Montoya, CNM, NP-C 2:29 AM 07/09/18

## 2018-07-10 LAB — CBC
HCT: 30.7 % — ABNORMAL LOW (ref 36.0–46.0)
Hemoglobin: 10.4 g/dL — ABNORMAL LOW (ref 12.0–15.0)
MCH: 30.4 pg (ref 26.0–34.0)
MCHC: 33.9 g/dL (ref 30.0–36.0)
MCV: 89.8 fL (ref 78.0–100.0)
Platelets: 302 10*3/uL (ref 150–400)
RBC: 3.42 MIL/uL — ABNORMAL LOW (ref 3.87–5.11)
RDW: 13 % (ref 11.5–15.5)
WBC: 13.5 10*3/uL — ABNORMAL HIGH (ref 4.0–10.5)

## 2018-07-10 LAB — RPR: RPR Ser Ql: NONREACTIVE

## 2018-07-10 LAB — GLUCOSE, CAPILLARY
Glucose-Capillary: 135 mg/dL — ABNORMAL HIGH (ref 70–99)
Glucose-Capillary: 63 mg/dL — ABNORMAL LOW (ref 70–99)

## 2018-07-10 MED ORDER — IBUPROFEN 600 MG PO TABS: 600.0000 mg | ORAL_TABLET | Freq: Four times a day (QID) | ORAL | 0 refills | Status: DC

## 2018-07-10 MED ORDER — INSULIN LISPRO 100 UNIT/ML (KWIKPEN): 6.0000 [IU] | PEN_INJECTOR | Freq: Three times a day (TID) | SUBCUTANEOUS | 11 refills | Status: DC

## 2018-07-10 NOTE — Progress Notes (Signed)
Post Partum Day 1 SVD Subjective: no complaints, up ad lib, voiding, tolerating PO and + flatus  Objective: Blood pressure 119/70, pulse 97, temperature 98.5 F (36.9 C), temperature source Oral, resp. rate 18, height 5\' 4"  (1.626 m), weight 97.5 kg, last menstrual period 11/15/2017, SpO2 99 %.  Physical Exam:  General: alert, cooperative and no distress Lochia: appropriate Uterine Fundus: firm Incision: NA DVT Evaluation: No evidence of DVT seen on physical exam.  Recent Labs    07/09/18 0858 07/10/18 0604  HGB 10.5* 10.4*  HCT 31.8* 30.7*    Assessment/Plan: Plan for discharge tomorrow, Breastfeeding and Lactation consult  Type 2 DM- well controlled on metformin 100 mg bid and novolog 6 units with meals.  Continue diabetic diet  D/C home in AM..  Dr. Charlotta Newtonzan to assume care in AM   LOS: 6 days   Anara Cowman J. 07/10/2018, 1:55 PM

## 2018-07-10 NOTE — Discharge Instructions (Signed)
Vaginal Delivery, Care After °Refer to this sheet in the next few weeks. These instructions provide you with information about caring for yourself after vaginal delivery. Your health care provider may also give you more specific instructions. Your treatment has been planned according to current medical practices, but problems sometimes occur. Call your health care provider if you have any problems or questions. °What can I expect after the procedure? °After vaginal delivery, it is common to have: °· Some bleeding from your vagina. °· Soreness in your abdomen, your vagina, and the area of skin between your vaginal opening and your anus (perineum). °· Pelvic cramps. °· Fatigue. ° °Follow these instructions at home: °Medicines °· Take over-the-counter and prescription medicines only as told by your health care provider. °· If you were prescribed an antibiotic medicine, take it as told by your health care provider. Do not stop taking the antibiotic until it is finished. °Driving ° °· Do not drive or operate heavy machinery while taking prescription pain medicine. °· Do not drive for 24 hours if you received a sedative. °Lifestyle °· Do not drink alcohol. This is especially important if you are breastfeeding or taking medicine to relieve pain. °· Do not use tobacco products, including cigarettes, chewing tobacco, or e-cigarettes. If you need help quitting, ask your health care provider. °Eating and drinking °· Drink at least 8 eight-ounce glasses of water every day unless you are told not to by your health care provider. If you choose to breastfeed your baby, you may need to drink more water than this. °· Eat high-fiber foods every day. These foods may help prevent or relieve constipation. High-fiber foods include: °? Whole grain cereals and breads. °? Brown rice. °? Beans. °? Fresh fruits and vegetables. °Activity °· Return to your normal activities as told by your health care provider. Ask your health care provider  what activities are safe for you. °· Rest as much as possible. Try to rest or take a nap when your baby is sleeping. °· Do not lift anything that is heavier than your baby or 10 lb (4.5 kg) until your health care provider says that it is safe. °· Talk with your health care provider about when you can engage in sexual activity. This may depend on your: °? Risk of infection. °? Rate of healing. °? Comfort and desire to engage in sexual activity. °Vaginal Care °· If you have an episiotomy or a vaginal tear, check the area every day for signs of infection. Check for: °? More redness, swelling, or pain. °? More fluid or blood. °? Warmth. °? Pus or a bad smell. °· Do not use tampons or douches until your health care provider says this is safe. °· Watch for any blood clots that may pass from your vagina. These may look like clumps of dark red, brown, or black discharge. °General instructions °· Keep your perineum clean and dry as told by your health care provider. °· Wear loose, comfortable clothing. °· Wipe from front to back when you use the toilet. °· Ask your health care provider if you can shower or take a bath. If you had an episiotomy or a perineal tear during labor and delivery, your health care provider may tell you not to take baths for a certain length of time. °· Wear a bra that supports your breasts and fits you well. °· If possible, have someone help you with household activities and help care for your baby for at least a few days after   you leave the hospital. °· Keep all follow-up visits for you and your baby as told by your health care provider. This is important. °Contact a health care provider if: °· You have: °? Vaginal discharge that has a bad smell. °? Difficulty urinating. °? Pain when urinating. °? A sudden increase or decrease in the frequency of your bowel movements. °? More redness, swelling, or pain around your episiotomy or vaginal tear. °? More fluid or blood coming from your episiotomy or  vaginal tear. °? Pus or a bad smell coming from your episiotomy or vaginal tear. °? A fever. °? A rash. °? Little or no interest in activities you used to enjoy. °? Questions about caring for yourself or your baby. °· Your episiotomy or vaginal tear feels warm to the touch. °· Your episiotomy or vaginal tear is separating or does not appear to be healing. °· Your breasts are painful, hard, or turn red. °· You feel unusually sad or worried. °· You feel nauseous or you vomit. °· You pass large blood clots from your vagina. If you pass a blood clot from your vagina, save it to show to your health care provider. Do not flush blood clots down the toilet without having your health care provider look at them. °· You urinate more than usual. °· You are dizzy or light-headed. °· You have not breastfed at all and you have not had a menstrual period for 12 weeks after delivery. °· You have stopped breastfeeding and you have not had a menstrual period for 12 weeks after you stopped breastfeeding. °Get help right away if: °· You have: °? Pain that does not go away or does not get better with medicine. °? Chest pain. °? Difficulty breathing. °? Blurred vision or spots in your vision. °? Thoughts about hurting yourself or your baby. °· You develop pain in your abdomen or in one of your legs. °· You develop a severe headache. °· You faint. °· You bleed from your vagina so much that you fill two sanitary pads in one hour. °This information is not intended to replace advice given to you by your health care provider. Make sure you discuss any questions you have with your health care provider. °Document Released: 10/30/2000 Document Revised: 04/15/2016 Document Reviewed: 11/17/2015 °Elsevier Interactive Patient Education © 2018 [employer] ° °

## 2018-07-10 NOTE — Anesthesia Postprocedure Evaluation (Signed)
Anesthesia Post Note  Patient: Jill Montoya  Procedure(s) Performed: AN AD HOC LABOR EPIDURAL     Patient location during evaluation: Mother Baby Anesthesia Type: Epidural Level of consciousness: awake and alert and oriented Pain management: satisfactory to patient Vital Signs Assessment: post-procedure vital signs reviewed and stable Respiratory status: respiratory function stable Cardiovascular status: stable Postop Assessment: no headache, no backache, epidural receding, patient able to bend at knees, no signs of nausea or vomiting and adequate PO intake Anesthetic complications: no    Last Vitals:  Vitals:   07/10/18 0608 07/10/18 0725  BP: 127/75 99/85  Pulse: 72 74  Resp: 16 18  Temp: 36.8 C 37.1 C  SpO2: 96% 99%    Last Pain:  Vitals:   07/10/18 0725  TempSrc: Oral  PainSc:    Pain Goal: Patients Stated Pain Goal: 6 (07/09/18 0746)               Karleen DolphinFUSSELL,[name]

## 2018-07-10 NOTE — Progress Notes (Signed)
2 hrs post prandial blood glucose @ 63.  RN gave 4 fl oz of apple juice. Will recheck blood glucose in 15 min.

## 2018-07-10 NOTE — Lactation Note (Signed)
This note was copied from a baby's chart. Lactation Consultation Note  Patient Name: Jill Montoya ZOXWR'UToday's Date: 07/10/2018 Reason for consult: Initial assessment;1st time breastfeeding;Primapara;NICU baby;Preterm <34wks;Maternal endocrine disorder Type of Endocrine Disorder?: Diabetes  2816 hours old pre-term female NICU baby who is in NPO at this point. Mother's feeding choice upon admission was BF, she started pumping yesterday but has not pumped today yet. Explained to mom the importance of continuous pumping for breast stimulation and for the onset of lactogenesis II especially on a pre-term baby. Mom voiced understanding. [facility] have [facility] because baby came ahead of time.   Showed mom how to hand express, but unable to get any colostrum at this point. When checking on her DEBP noticed that the junctures were not properly attached, LC adjusted them and let mom know that she'll feel a difference in the suction level.   Encourage mom to pump every 3 hours and at least once at night, 6-8 times/24 hours. Once she starts getting colostrum she'll give it to her RN to take it to her NICU baby. BF brochure, BF resources and pumping log were reviewed, mom is aware of LC services and will call PRN.    Maternal Data Formula Feeding for Exclusion: No Has patient been taught Hand Expression?: Yes Does the patient have breastfeeding experience prior to this delivery?: No  Feeding   Interventions Interventions: Breast feeding basics reviewed;Breast massage;Breast compression;Hand express;DEBP  Lactation Tools Discussed/Used Tools: Pump Breast pump type: Double-Electric Breast Pump WIC Program: Yes Pump Review: Setup, frequency, and cleaning Initiated by:: RN and IBCLC Date initiated:: 07/09/18   Consult Status Consult Status: Follow-up Date: 07/11/18 Follow-up type: In-patient    Kannon Baum Venetia ConstableS Lasandra Batley 07/10/2018, 11:38  AM

## 2018-07-11 LAB — GLUCOSE, CAPILLARY
Glucose-Capillary: 120 mg/dL — ABNORMAL HIGH (ref 70–99)
Glucose-Capillary: 66 mg/dL — ABNORMAL LOW (ref 70–99)
Glucose-Capillary: 78 mg/dL (ref 70–99)

## 2018-07-11 MED ORDER — MEDROXYPROGESTERONE ACETATE 150 MG/ML IM SUSP
150.0000 mg | Freq: Once | INTRAMUSCULAR | Status: AC
  Administered 2018-07-11: 150 mg via INTRAMUSCULAR
  Filled 2018-07-11 (×2): qty 1

## 2018-07-11 MED FILL — Penicillin G Potassium For Inj 5000000 Unit: INTRAMUSCULAR | Qty: 3 | Status: AC

## 2018-07-11 MED FILL — Sodium Chloride IV Soln 0.9%: INTRAVENOUS | Qty: 100 | Status: AC

## 2018-07-11 NOTE — Progress Notes (Signed)
DC instructions and postpartum care reviewed with patient. Medication changes and follow up appointment discussed.  IV removed and Depo shot given.

## 2018-07-11 NOTE — Progress Notes (Signed)
Patient screened out for psychosocial assessment since none of the following apply: °Psychosocial stressors documented in mother or baby's chart °Gestation less than 32 weeks °Code at delivery  °Infant with anomalies °Please contact the Clinical Social Worker if specific needs arise, by MOB's request, or if MOB scores greater than 9/yes to question 10 on [place] Postpartum Depression Screen. ° °Dona Walby Boyd-Gilyard, MSW, LCSW °Clinical Social Work °[phone] °  °

## 2018-07-11 NOTE — Progress Notes (Signed)
Pt requested blood glucose level to be taken, it was 66. Graham crackers and apple juice given to pt. Will recheck blood glucose in 15 min.

## 2018-07-11 NOTE — Discharge Summary (Signed)
OB Discharge Summary     Patient Name: Jill Montoya DOB: January 20, 1996 MRN: [id number]  Date of admission: 07/04/2018 Delivering MD: Gerald LeitzOLE, TARA   Date of discharge: 07/11/2018  Admitting diagnosis: 33WKS, ROM Intrauterine pregnancy @ 3363w0d     Secondary diagnosis:  Active Problems:   Preterm premature rupture of membranes  Additional problems: Type 2 Diabetes     Discharge diagnosis: Preterm Pregnancy Delivered and Type 2 DM                                                                                                Post partum procedures:none  Augmentation: Pitocin  Complications: None  Hospital course:  Pt presented @ 6663w0d for PPROM.  She received IV latency antibiotics, IV magnesium for tocolysis and betamethasone for steroid prophylaxis.  She was monitored in house.  Due to her T2DM, insulin was changed according to diabetic coordinator.  On 8/24 at 5438w5d pt noted worsening painful contractions and was noted to be in active labor.  Patient had an uncomplicated labor course as follows:  Membrane Rupture Time/Date:   ,07/03/2018   Intrapartum Procedures: Episiotomy: None [1]                                         Lacerations:  2nd degree [3];Perineal [11]  Patient had a delivery of a Viable infant. 07/09/2018  Information for the patient's newborn:  Dennie MaizesLambert, Girl Wilson [[id number]][[id number]]  Delivery Method: Vaginal, Spontaneous(Filed from Delivery Summary)    Pateint had an uncomplicated postpartum course.  She is ambulating, tolerating a regular diet, passing flatus, and urinating well. Patient is discharged home in stable condition on 07/11/18.   Physical exam  Vitals:   07/10/18 1616 07/10/18 2001 07/11/18 0019 07/11/18 0549  BP: 115/68 126/80 128/81 109/70  Pulse: 70 75 73 62  Resp: 18 18 18 16   Temp: 98.9 F (37.2 C) 98.3 F (36.8 C) 97.7 F (36.5 C) 98.2 F (36.8 C)  TempSrc: Oral Oral Oral Oral  SpO2: 99% 98% 100% 100%  Weight:      Height:       General:  alert, cooperative and no distress Lochia: appropriate Uterine Fundus: firm Incision: N/A DVT Evaluation: No evidence of DVT seen on physical exam. Labs: Lab Results  Component Value Date   WBC 13.5 (H) 07/10/2018   HGB 10.4 (L) 07/10/2018   HCT 30.7 (L) 07/10/2018   MCV 89.8 07/10/2018   PLT 302 07/10/2018   CMP Latest Ref Rng & Units 07/03/2018  Glucose 70 - 99 mg/dL 782(N208(H)  BUN 6 - 20 mg/dL 8  Creatinine 5.620.44 - 1.301.00 mg/dL 8.650.65  Sodium 784135 - 696145 mmol/L 134(L)  Potassium 3.5 - 5.1 mmol/L 4.2  Chloride 98 - 111 mmol/L 101  CO2 22 - 32 mmol/L 21(L)  Calcium 8.9 - 10.3 mg/dL 9.5  Total Protein 6.5 - 8.1 g/dL 6.1(L)  Total Bilirubin 0.3 - 1.2 mg/dL 0.6  Alkaline Phos 38 - 126 U/L 76  AST 15 -  41 U/L 21  ALT 0 - 44 U/L 28    Discharge instruction: per After Visit Summary and "Baby and Me Booklet".  After visit meds:  Allergies as of 07/11/2018      Reactions   Nasonex [mometasone Furoate]    "My nasal passages close up"      Medication List    STOP taking these medications   cephALEXin 500 MG capsule Commonly known as:  KEFLEX   NIFEdipine 10 MG capsule Commonly known as:  PROCARDIA     TAKE these medications   glucose blood test strip USED TO CHECK BLOOD SUGARS FOUR TIMES DAILY.   ibuprofen 600 MG tablet Commonly known as:  ADVIL,MOTRIN Take 1 tablet (600 mg total) by mouth every 6 (six) hours.   insulin lispro 100 UNIT/ML KiwkPen Commonly known as:  HUMALOG Inject 0.06 mLs (6 Units total) into the skin 3 (three) times daily with meals. And pen needles 3/day What changed:  how much to take   metFORMIN 500 MG 24 hr tablet Commonly known as:  GLUCOPHAGE-XR Take 4 tablets (2,000 mg total) by mouth daily. What changed:    how much to take  when to take this   nystatin cream Commonly known as:  MYCOSTATIN Apply to affected area 2 times daily   PRENATAL VITAMIN PO Take 1 tablet by mouth daily.       Diet: carb modified diet  Activity: Advance as  tolerated. Pelvic rest for 6 weeks.   Outpatient follow up:6 weeks Follow up Appt:No future appointments. Follow up Visit:No follow-ups on file.  Postpartum contraception: Depo Provera  Newborn Data: Live born female  Birth Weight: 6 lb 8.8 oz (2970 g) APGAR: 9, 9  Newborn Delivery   Birth date/time:  07/09/2018 19:03:00 Delivery type:  Vaginal, Spontaneous     Baby Feeding: pumping Disposition:NICU   07/11/2018 Sharon Seller, DO

## 2018-07-14 ENCOUNTER — Ambulatory Visit: Payer: Self-pay

## 2018-07-14 NOTE — Lactation Note (Addendum)
This note was copied from a baby's chart. Lactation Consultation Note  Patient Name: Jill Montoya ZOXWR'UToday's Date: 07/14/2018 Reason for consult: Follow-up assessment;Primapara;1st time breastfeeding;NICU baby;Preterm <34wks;Maternal endocrine disorder;Other (Comment)(RN request for mom ) Type of Endocrine Disorder?: Diabetes  As LC arrived to 203 -1 , mom holding baby in a loose blanket.  Baby sleeping. [facility].  LC discussed the age of the baby with breast feeding and latching, and will take time.  LC recommended lots of STS, if baby is hungry and interesting is the best time to try to latch.  The areola needs to be compressible so the baby can get a deep latch.  If the baby is awake, may need to nipple a small appetizer and then latch.  33 5/day , less than 6 pounds, and latch better with a Nipple Shield.  LC sized mom for #20 NS and #24 NS. The #20 NS when mom isn't has full with accommodate The baby's mouth. LC explained it is important  When abby is latched that the side mom of moms nipple isn't showing.  Per mom is pumping off 30 ml off each breast every pumping, and she is pumping at least once  At night at 4 hours. LC praised mom for her efforts, and recommended to set her alarm at 3 hours  If full get up and pump to protect milk supply. Per mom pumping is comfortable.  Also by 7 days she should be pumping off at least 500ml and a minimum or vern better 750 ml to know her milk supply is going well.  Per mom pumped when she got up this morning and has not pumped since, breast are full , not engorged. Mom plans to pump while in NICU.    Maternal Data    Feeding  Interventions Interventions: Breast feeding basics reviewed  Lactation Tools Discussed/Used Tools: Pump;Nipple Shields Nipple shield size: 20;24;Other (comment)(breast are full, LC fitted mom with NS's - see LC note ) Breast pump type: Double-Electric Breast Pump   Consult Status Consult  Status: PRN Follow-up type: Other (comment)(baby in NICU )    Kathrin GreathouseMargaret Ann Hallie Ishida 07/14/2018, 12:38 PM

## 2018-07-21 ENCOUNTER — Ambulatory Visit: Payer: Self-pay

## 2018-07-21 NOTE — Lactation Note (Signed)
This note was copied from a baby's chart. Lactation Consultation Note  Patient Name: Jill Montoya [id number] Date: 07/21/2018 Reason for consult: Initial assessment;Preterm <34wks;Difficult latch;1st time breastfeeding;Primapara Type of Endocrine Disorder?: Diabetes  P1 mother whose infant is now 28 days old.  This is a NICU baby born at 33+5 weeks.  Mother requested latch assistance.  When I arrived baby was awake and alert.  The corrected gestational age is now 35+3 weeks today.  Mother's breasts are full, soft and non tender and nipples are short shafted bilaterally.  On my gloved finger baby was able to suck strongly with good rhythmic movement.  Latched onto the right breast in the football hold after a few repeated attempts.  Baby would suck 2-3 times and become fussy.  Mother demonstrated some hand expression and a few drops EBM finger fed back to baby.  Again, latched onto the right breast but only sucked a few times before becoming fussy and releasing.    I suggested trying the left breast in the cross cradle hold and mother agreed to try.  EBM obtained and latched baby easily with the same results.  After a few sucks she became fussy and released.  RN prepared tube feeding and baby was fed.    To help evert nipples I gave mother some breast shells and a manual pump with instructions for use.  #24 flange size changed to a #27 for more appropriate fit.  Demonstrated pump and mother felt no pain with pumping.  Encouraged her to continue trying to breastfeed when she visits.  She knows to ask for latch assistance as needed.  RN updated.   Maternal Data Has patient been taught Hand Expression?: Yes Does the patient have breastfeeding experience prior to this delivery?: No  Feeding Feeding Type: Breast Fed Length of feed: 12 min(on/off)  LATCH Score Latch: Repeated attempts needed to sustain latch, nipple held in mouth throughout feeding, stimulation needed to elicit sucking  reflex.  Audible Swallowing: None  Type of Nipple: Everted at rest and after stimulation(short shafter bilaterally)  Comfort (Breast/Nipple): Soft / non-tender  Hold (Positioning): Assistance needed to correctly position infant at breast and maintain latch.  LATCH Score: 6  Interventions Interventions: Breast feeding basics reviewed;Assisted with latch;Skin to skin;Breast massage;Hand express;Pre-pump if needed;Position options;Support pillows;Adjust position;Breast compression;Shells;Hand pump  Lactation Tools Discussed/Used Tools: Shells;Pump;Flanges Flange Size: 27 Shell Type: Inverted Breast pump type: Double-Electric Breast Pump;Manual WIC Program: Yes Pump Review: Setup, frequency, and cleaning;Milk Storage Initiated by:: Orlin Kann Date initiated:: 07/21/18   Consult Status Consult Status: PRN Date: 07/21/18 Follow-up type: Call as needed    Devontre Siedschlag R Clearnce Leja 07/21/2018, 12:59 PM

## 2018-10-04 DIAGNOSIS — Z3042 Encounter for surveillance of injectable contraceptive: Secondary | ICD-10-CM | POA: Diagnosis not present

## 2018-11-14 ENCOUNTER — Encounter (HOSPITAL_COMMUNITY): Payer: Self-pay

## 2018-11-16 ENCOUNTER — Other Ambulatory Visit: Payer: Self-pay

## 2018-11-16 ENCOUNTER — Encounter (HOSPITAL_COMMUNITY): Payer: Self-pay | Admitting: Emergency Medicine

## 2018-11-16 ENCOUNTER — Emergency Department (HOSPITAL_COMMUNITY)
Admission: EM | Admit: 2018-11-16 | Discharge: 2018-11-16 | Disposition: A | Discharge status: Home / Self Care | Payer: 59 | Attending: Emergency Medicine | Admitting: Emergency Medicine

## 2018-11-16 DIAGNOSIS — E1165 Type 2 diabetes mellitus with hyperglycemia: Secondary | ICD-10-CM | POA: Insufficient documentation

## 2018-11-16 DIAGNOSIS — Z79899 Other long term (current) drug therapy: Secondary | ICD-10-CM | POA: Insufficient documentation

## 2018-11-16 DIAGNOSIS — Z794 Long term (current) use of insulin: Secondary | ICD-10-CM | POA: Insufficient documentation

## 2018-11-16 DIAGNOSIS — R739 Hyperglycemia, unspecified: Secondary | ICD-10-CM

## 2018-11-16 LAB — I-STAT BETA HCG BLOOD, ED (MC, WL, AP ONLY): I-stat hCG, quantitative: <5 m[IU]/mL (ref <5)

## 2018-11-16 LAB — CBC
HCT: 39.2 % (ref 36.0–46.0)
Hemoglobin: 12.9 g/dL (ref 12.0–15.0)
MCH: 28.6 pg (ref 26.0–34.0)
MCHC: 32.9 g/dL (ref 30.0–36.0)
MCV: 86.9 fL (ref 80.0–100.0)
Platelets: 400 10*3/uL (ref 150–400)
RBC: 4.51 MIL/uL (ref 3.87–5.11)
RDW: 11.8 % (ref 11.5–15.5)
WBC: 7.1 10*3/uL (ref 4.0–10.5)
nRBC: 0 % (ref 0.0–0.2)

## 2018-11-16 LAB — URINALYSIS, ROUTINE W REFLEX MICROSCOPIC
Bacteria, UA: NONE SEEN
Bilirubin Urine: NEGATIVE
Glucose, UA: >=500 mg/dL — AB
Ketones, ur: NEGATIVE mg/dL
Leukocytes, UA: NEGATIVE
Nitrite: NEGATIVE
Protein, ur: NEGATIVE mg/dL
Specific Gravity, Urine: 1.031 — ABNORMAL HIGH (ref 1.005–1.030)
pH: 6 (ref 5.0–8.0)

## 2018-11-16 LAB — BASIC METABOLIC PANEL
Anion gap: 10 (ref 5–15)
BUN: 11 mg/dL (ref 6–20)
CO2: 22 mmol/L (ref 22–32)
Calcium: 8.8 mg/dL — ABNORMAL LOW (ref 8.9–10.3)
Chloride: 103 mmol/L (ref 98–111)
Creatinine, Ser: 0.89 mg/dL (ref 0.44–1.00)
GFR calc Af Amer: >60 mL/min (ref >60)
GFR calc non Af Amer: >60 mL/min (ref >60)
Glucose, Bld: 385 mg/dL — ABNORMAL HIGH (ref 70–99)
Potassium: 3.8 mmol/L (ref 3.5–5.1)
Sodium: 135 mmol/L (ref 135–145)

## 2018-11-16 LAB — CBG MONITORING, ED
Glucose-Capillary: 377 mg/dL — ABNORMAL HIGH (ref 70–99)
Glucose-Capillary: 367 mg/dL — ABNORMAL HIGH (ref 70–99)
Glucose-Capillary: 268 mg/dL — ABNORMAL HIGH (ref 70–99)

## 2018-11-16 MED ORDER — PEN NEEDLES 31G X 8 MM MISC: 1.0000 | Freq: Three times a day (TID) | 0 refills | Status: AC

## 2018-11-16 MED ORDER — SODIUM CHLORIDE 0.9 % IV BOLUS
1000.0000 mL | Freq: Once | INTRAVENOUS | Status: AC
  Administered 2018-11-16: 1000 mL via INTRAVENOUS

## 2018-11-16 MED ORDER — INSULIN ASPART 100 UNIT/ML ~~LOC~~ SOLN
6.0000 [IU] | Freq: Once | SUBCUTANEOUS | Status: AC
  Administered 2018-11-16: 6 [IU] via SUBCUTANEOUS
  Filled 2018-11-16: qty 1

## 2018-11-16 MED ORDER — INSULIN LISPRO (1 UNIT DIAL) 100 UNIT/ML (KWIKPEN): 6.0000 [IU] | PEN_INJECTOR | Freq: Three times a day (TID) | SUBCUTANEOUS | 0 refills | Status: DC

## 2018-11-16 NOTE — Discharge Instructions (Addendum)
Continue taking home medication as prescribed. Follow-up with your primary care doctor as needed for further evaluation of blood sugars. Return to the emergency room with any new, worsening, concerning symptoms.

## 2018-11-16 NOTE — ED Triage Notes (Signed)
Pt concern related to hyperglycemia for a few days; out of insulin related to PCP office being closed. Denies sickness.

## 2018-11-16 NOTE — ED Provider Notes (Signed)
[facility]-EMERGENCY DEPT Provider Note   CSN: [id number] Arrival date & time: 11/16/18  1754     History   Chief Complaint Chief Complaint  Patient presents with  . Hyperglycemia    HPI Jill Montoya is a 23 y.o. female presenting for evaluation of hyperglycemia.  Patient states she has been out of her insulin for the past 3 days.  As such, her blood sugars have been elevated, going up to 500.  Patient states she has been unable to fill her medicine due to the holiday.  She reports no symptoms.  She denies fevers, chills, headache, vision changes, slurred speech, sore throat, cough, chest pain, shortness breath, nausea, vomiting, abdominal pain, urinary symptoms, abnormal bowel movements.  Besides diabetes, she has no other medical problems.  Patient states she takes Humalog, no other medications.  She called her PCP after hours, who recommended she come to the ER for lowering her BGL and for refill prescription of her insulin.  HPI  Past Medical History:  Diagnosis Date  . Chlamydia   . Diabetes mellitus without complication [facility](HCC)     Patient Active Problem List   Diagnosis Date Noted  . Preterm premature rupture of membranes 07/04/2018  . GBS (group b Streptococcus) UTI complicating pregnancy, third trimester 07/01/2018  . Cutaneous candidiasis 04/10/2018  . Diabetes mellitus during pregnancy, antepartum 04/07/2018    Past Surgical History:  Procedure Laterality Date  . NO PAST SURGERIES       OB History    Gravida  1   Para      Term      Preterm      AB      Living  0     SAB      TAB      Ectopic      Multiple      Live Births               Home Medications    Prior to Admission medications   Medication Sig Start Date End Date Taking? Authorizing Provider  glucose blood (ONE TOUCH ULTRA TEST) test strip USED TO CHECK BLOOD SUGARS FOUR TIMES DAILY. 06/13/18  Yes Romero BellingEllison, Sean, MD  insulin lispro (HUMALOG KWIKPEN) 100  UNIT/ML KiwkPen Inject 0.06 mLs (6 Units total) into the skin 3 (three) times daily with meals. And pen needles 3/day 07/10/18  Yes Myna Hidalgozan, Jennifer, DO  ibuprofen (ADVIL,MOTRIN) 600 MG tablet Take 1 tablet (600 mg total) by mouth every 6 (six) hours. Patient not taking: Reported on 11/16/2018 07/10/18   Myna Hidalgozan, Jennifer, DO  insulin lispro (HUMALOG KWIKPEN) 100 UNIT/ML KwikPen Inject 0.06 mLs (6 Units total) into the skin 3 (three) times daily with meals. 11/16/18 12/16/18  Maclain Cohron, PA-C  Insulin Pen Needle (PEN NEEDLES) 31G X 8 MM MISC 1 Product by Does not apply route 3 (three) times daily. 11/16/18 12/16/18  Peder Allums, PA-C  metFORMIN (GLUCOPHAGE-XR) 500 MG 24 hr tablet Take 4 tablets (2,000 mg total) by mouth daily. Patient not taking: Reported on 11/16/2018 04/14/18   Romero BellingEllison, Sean, MD  nystatin cream (MYCOSTATIN) Apply to affected area 2 times daily Patient not taking: Reported on 05/03/2018 04/10/18   Raelyn Moraawson, Rolitta, CNM    Family History Family History  Problem Relation Age of Onset  . Diabetes Mother   . Diabetes Father     Social History Social History   Tobacco Use  . Smoking status: Never Smoker  . Smokeless tobacco: Never Used  Substance Use Topics  . Alcohol use: No  . Drug use: Never     Allergies   Nasonex [mometasone furoate]   Review of Systems Review of Systems  All other systems reviewed and are negative.    Physical Exam Updated Vital Signs BP 124/70   Pulse 65   Temp 98.9 F (37.2 C) (Oral)   Resp 17   Ht 5\' 4"  (1.626 m)   Wt 81.6 kg   SpO2 100%   BMI 30.90 kg/m   Physical Exam Vitals signs and nursing note reviewed.  Constitutional:      General: She is not in acute distress.    Appearance: She is well-developed.     Comments: Resting comfortably in the bed in no acute distress  HENT:     Head: Normocephalic and atraumatic.     Mouth/Throat:     Mouth: Mucous membranes are moist.  Eyes:     Conjunctiva/sclera: Conjunctivae  normal.     Pupils: Pupils are equal, round, and reactive to light.  Neck:     Musculoskeletal: Normal range of motion and neck supple.  Cardiovascular:     Rate and Rhythm: Normal rate and regular rhythm.  Pulmonary:     Effort: Pulmonary effort is normal. No respiratory distress.     Breath sounds: Normal breath sounds. No wheezing.  Abdominal:     General: There is no distension.     Palpations: Abdomen is soft.     Tenderness: There is no abdominal tenderness.  Musculoskeletal: Normal range of motion.  Skin:    General: Skin is warm and dry.     Capillary Refill: Capillary refill takes less than 2 seconds.  Neurological:     Mental Status: She is alert and oriented to person, place, and time.      ED Treatments / Results  Labs (all labs ordered are listed, but only abnormal results are displayed) Labs Reviewed  BASIC METABOLIC PANEL - Abnormal; Notable for the following components:      Result Value   Glucose, Bld 385 (*)    Calcium 8.8 (*)    All other components within normal limits  URINALYSIS, ROUTINE W REFLEX MICROSCOPIC - Abnormal; Notable for the following components:   Color, Urine STRAW (*)    Specific Gravity, Urine 1.031 (*)    Glucose, UA >=500 (*)    Hgb urine dipstick SMALL (*)    All other components within normal limits  CBG MONITORING, ED - Abnormal; Notable for the following components:   Glucose-Capillary 377 (*)    All other components within normal limits  CBG MONITORING, ED - Abnormal; Notable for the following components:   Glucose-Capillary 367 (*)    All other components within normal limits  CBG MONITORING, ED - Abnormal; Notable for the following components:   Glucose-Capillary 268 (*)    All other components within normal limits  CBC  I-STAT BETA HCG BLOOD, ED ([facility], [facility], [facility] ONLY)    EKG None  Radiology No results found.  Procedures Procedures (including critical care time)  Medications Ordered in ED Medications  sodium  chloride 0.9 % bolus 1,000 mL (0 mLs Intravenous Stopped 11/16/18 2143)  insulin aspart (novoLOG) injection 6 Units (6 Units Subcutaneous Given 11/16/18 2113)     Initial Impression / Assessment and Plan / ED Course  I have reviewed the triage vital signs and the nursing notes.  Pertinent labs & imaging results that were available during my care of the patient  were reviewed by me and considered in my medical decision making (see chart for details).     Presenting for evaluation of hyperglycemia due to being out of her insulin.  Physical exam reassuring, she appears nontoxic.  Patient without complaints.  Blood sugar mildly elevated in the upper 300s.  No sign of DKA or other concerning lab abnormalities.  As such, will give liter of fluid, subcu insulin, and reassess.  On reassessment, blood sugar improved to the 200s.  Will discharge patient with prescription for her home insulin and follow-up with PCP.  At this time, patient appears safe for discharge.  Return precautions given.  Patient states she understands and agrees to plan.   Final Clinical Impressions(s) / ED Diagnoses   Final diagnoses:  Hyperglycemia    ED Discharge Orders         Ordered    insulin lispro (HUMALOG KWIKPEN) 100 UNIT/ML KwikPen  3 times daily with meals     11/16/18 2225    Insulin Pen Needle (PEN NEEDLES) 31G X 8 MM MISC  3 times daily     11/16/18 2225           Alveria Apley, PA-C 11/17/18 0006    Little, Ambrose Finland, MD 11/17/18 [phone]

## 2018-11-17 ENCOUNTER — Telehealth: Payer: Self-pay | Admitting: Endocrinology

## 2018-11-17 NOTE — Telephone Encounter (Signed)
FYI

## 2018-11-17 NOTE — Telephone Encounter (Signed)
[facility]16/2018 due to blood sugar being between 400-500 and complaints of blurred vision. Patient has been out of insulin pens for 2 days. After hours advised patient to go to the ED where they refilled the patients insulin pens.

## 2018-11-18 ENCOUNTER — Telehealth: Payer: Self-pay | Admitting: Endocrinology

## 2018-11-18 NOTE — Telephone Encounter (Signed)
Yes, sure 

## 2018-11-18 NOTE — Telephone Encounter (Signed)
Called patient and let her know she can pick up samples at her earliest convenience-samples will be in fridge with her name on them

## 2018-11-18 NOTE — Telephone Encounter (Signed)
Please advise in Dr Ellison's absence.  

## 2018-11-18 NOTE — Telephone Encounter (Signed)
Patient informed of this.

## 2018-11-18 NOTE — Telephone Encounter (Signed)
Patient was in the hospital due to high blood sugar. Patients mom stated she is temporarily without insurance for a week and is requesting a sample of insulin until she gets insurance.  Please Advise, thanks

## 2018-11-21 ENCOUNTER — Ambulatory Visit (INDEPENDENT_AMBULATORY_CARE_PROVIDER_SITE_OTHER): Payer: 59 | Admitting: Endocrinology

## 2018-11-21 ENCOUNTER — Encounter: Payer: Self-pay | Admitting: Endocrinology

## 2018-11-21 VITALS — BP 124/78 | HR 107 | Ht 64.0 in | Wt 187.6 lb

## 2018-11-21 DIAGNOSIS — O24819 Other pre-existing diabetes mellitus in pregnancy, unspecified trimester: Secondary | ICD-10-CM

## 2018-11-21 LAB — POCT GLYCOSYLATED HEMOGLOBIN (HGB A1C): Hemoglobin A1C: 9.5 % — AB (ref 4.0–5.6)

## 2018-11-21 MED ORDER — DULAGLUTIDE 1.5 MG/0.5ML ~~LOC~~ SOAJ: 1.5000 mg | SUBCUTANEOUS | 3 refills | Status: DC

## 2018-11-21 NOTE — Patient Instructions (Addendum)
check your blood sugar 4 times a day.  vary the time of day when you check, between before meals, after meals, and at bedtime.  also check if you have symptoms of your blood sugar being too high or too low.  please keep a record of the readings and bring it to your next appointment here (or you can bring the meter itself).  You can write it on any piece of paper.  please call us sooner if your blood sugar goes below 70, or if you have a lot of readings over 200.   Please continue the same insulin, and: I have sent a prescription to your pharmacy, to resume the trulicity.   Please call or message [place] next week, to tell us how the blood sugar is doing.  If necessary, we can resume the metformin.   Please come back for a follow-up appointment in 2 months, or sooner if another pregnancy happens.

## 2018-11-21 NOTE — Progress Notes (Signed)
Subjective:    Patient ID: Jill Montoya, female    DOB: 09-25-1996, 23 y.o.   MRN: [id number]  HPI Pt returns for f/u of diabetes mellitus: DM type: Insulin-requiring type 2 Dx'ed: 2015 Complications: none Therapy: insulin since 2019 pregnancy GDM: never DKA: never Severe hypoglycemia: never Pancreatitis: never Pancreatic imaging: never Other: she takes multiple daily injections. Interval history: she takes humalog, 6 units, 3 times a day (just before each meal).  no cbg record, but states cbg's vary from 200-500.  She is now 4 mos postpartum.  pt states she feels well in general.  She is not at risk for another pregnancy now.  She requests to resume Trulicity.  She is not breast-feeding now.   Past Medical History:  Diagnosis Date  . Chlamydia   . Diabetes mellitus without complication [facility])     Past Surgical History:  Procedure Laterality Date  . NO PAST SURGERIES      Social History   Socioeconomic History  . Marital status: Single    Spouse name: Not on file  . Number of children: Not on file  . Years of education: Not on file  . Highest education level: Not on file  Occupational History  . Not on file  Social Needs  . Financial resource strain: Not on file  . Food insecurity:    Worry: Not on file    Inability: Not on file  . Transportation needs:    Medical: Not on file    Non-medical: Not on file  Tobacco Use  . Smoking status: Never Smoker  . Smokeless tobacco: Never Used  Substance and Sexual Activity  . Alcohol use: No  . Drug use: Never  . Sexual activity: Yes    Birth control/protection: None  Lifestyle  . Physical activity:    Days per week: Not on file    Minutes per session: Not on file  . Stress: Not on file  Relationships  . Social connections:    Talks on phone: Not on file    Gets together: Not on file    Attends religious service: Not on file    Active member of club or organization: Not on file    Attends meetings of clubs or  organizations: Not on file    Relationship status: Not on file  . Intimate partner violence:    Fear of current or ex partner: Not on file    Emotionally abused: Not on file    Physically abused: Not on file    Forced sexual activity: Not on file  Other Topics Concern  . Not on file  Social History Narrative  . Not on file    Current Outpatient Medications on File Prior to Visit  Medication Sig Dispense Refill  . glucose blood (ONE TOUCH ULTRA TEST) test strip USED TO CHECK BLOOD SUGARS FOUR TIMES DAILY. 150 each 12  . insulin lispro (HUMALOG KWIKPEN) 100 UNIT/ML KwikPen Inject 0.06 mLs (6 Units total) into the skin 3 (three) times daily with meals. 5.4 mL 0  . Insulin Pen Needle (PEN NEEDLES) 31G X 8 MM MISC 1 Product by Does not apply route 3 (three) times daily. 90 each 0  . ibuprofen (ADVIL,MOTRIN) 600 MG tablet Take 1 tablet (600 mg total) by mouth every 6 (six) hours. (Patient not taking: Reported on 11/16/2018) 30 tablet 0  . nystatin cream (MYCOSTATIN) Apply to affected area 2 times daily (Patient not taking: Reported on 11/21/2018) 30 g 0   No  current facility-administered medications on file prior to visit.     Allergies  Allergen Reactions  . Nasonex [Mometasone Furoate]     "My nasal passages close up"    Family History  Problem Relation Age of Onset  . Diabetes Mother   . Diabetes Father     BP 124/78 (BP Location: Right Arm, Patient Position: Sitting, Cuff Size: Normal)   Pulse (!) 107   Ht 5\' 4"  (1.626 m)   Wt 187 lb 9.6 oz (85.1 kg)   SpO2 98%   BMI 32.20 kg/m   Review of Systems She denies hypoglycemia.      Objective:   Physical Exam VITAL SIGNS:  See vs page GENERAL: no distress Pulses: dorsalis pedis intact bilat.   MSK: no deformity of the feet CV: no leg edema Skin:  no ulcer on the feet.  normal color and temp on the feet.   Neuro: sensation is intact to touch on the feet.    Lab Results  Component Value Date   HGBA1C 9.5 (A) 11/21/2018        Assessment & Plan:  Insulin-requiring type 2 DM: worse.    Patient Instructions  check your blood sugar 4 times a day.  vary the time of day when you check, between before meals, after meals, and at bedtime.  also check if you have symptoms of your blood sugar being too high or too low.  please keep a record of the readings and bring it to your next appointment here (or you can bring the meter itself).  You can write it on any piece of paper.  please call us sooner if your blood sugar goes below 70, or if you have a lot of readings over 200.   Please continue the same insulin, and: I have sent a prescription to your pharmacy, to resume the trulicity.   Please call or message [place] next week, to tell us how the blood sugar is doing.  If necessary, we can resume the metformin.   Please come back for a follow-up appointment in 2 months, or sooner if another pregnancy happens.

## 2018-12-27 DIAGNOSIS — E119 Type 2 diabetes mellitus without complications: Secondary | ICD-10-CM | POA: Diagnosis not present

## 2018-12-27 DIAGNOSIS — Z794 Long term (current) use of insulin: Secondary | ICD-10-CM | POA: Diagnosis not present

## 2018-12-27 DIAGNOSIS — Z0000 Encounter for general adult medical examination without abnormal findings: Secondary | ICD-10-CM | POA: Diagnosis not present

## 2018-12-27 DIAGNOSIS — Z13220 Encounter for screening for lipoid disorders: Secondary | ICD-10-CM | POA: Diagnosis not present

## 2018-12-27 DIAGNOSIS — E663 Overweight: Secondary | ICD-10-CM | POA: Diagnosis not present

## 2019-01-03 DIAGNOSIS — Z3042 Encounter for surveillance of injectable contraceptive: Secondary | ICD-10-CM | POA: Diagnosis not present

## 2019-01-24 ENCOUNTER — Ambulatory Visit: Payer: 59 | Admitting: Endocrinology

## 2019-01-24 ENCOUNTER — Telehealth: Payer: Self-pay | Admitting: Endocrinology

## 2019-01-24 NOTE — Telephone Encounter (Signed)
Patient no showed today's appt. Please advise on how to follow up. °A. No follow up necessary. °B. Follow up urgent. Contact patient immediately. °C. Follow up necessary. Contact patient and schedule visit in ___ days. °D. Follow up advised. Contact patient and schedule visit in ____weeks. ° °Would you like the NS fee to be applied to this visit? ° °

## 2019-01-24 NOTE — Telephone Encounter (Signed)
Attempted to reschedule missed appointment-unable to leave a message due to patient's voice mail box is full.

## 2019-01-24 NOTE — Telephone Encounter (Signed)
Please schedule f/u appt for next available appointment  

## 2019-01-24 NOTE — Telephone Encounter (Signed)
Please refer to Dr. Ellison's response 

## 2019-03-31 DIAGNOSIS — Z3042 Encounter for surveillance of injectable contraceptive: Secondary | ICD-10-CM | POA: Diagnosis not present

## 2019-06-21 DIAGNOSIS — R05 Cough: Secondary | ICD-10-CM | POA: Diagnosis not present

## 2019-06-21 DIAGNOSIS — R509 Fever, unspecified: Secondary | ICD-10-CM | POA: Diagnosis not present

## 2019-08-18 DIAGNOSIS — Z3009 Encounter for other general counseling and advice on contraception: Secondary | ICD-10-CM | POA: Diagnosis not present

## 2019-08-18 DIAGNOSIS — Z3045 Encounter for surveillance of transdermal patch hormonal contraceptive device: Secondary | ICD-10-CM | POA: Diagnosis not present

## 2019-08-31 DIAGNOSIS — Z01419 Encounter for gynecological examination (general) (routine) without abnormal findings: Secondary | ICD-10-CM | POA: Diagnosis not present

## 2019-08-31 DIAGNOSIS — L292 Pruritus vulvae: Secondary | ICD-10-CM | POA: Diagnosis not present

## 2019-08-31 DIAGNOSIS — Z3045 Encounter for surveillance of transdermal patch hormonal contraceptive device: Secondary | ICD-10-CM | POA: Diagnosis not present

## 2019-12-23 DIAGNOSIS — Z20828 Contact with and (suspected) exposure to other viral communicable diseases: Secondary | ICD-10-CM | POA: Diagnosis not present

## 2019-12-27 DIAGNOSIS — U071 COVID-19: Secondary | ICD-10-CM | POA: Diagnosis not present

## 2019-12-27 DIAGNOSIS — Z20828 Contact with and (suspected) exposure to other viral communicable diseases: Secondary | ICD-10-CM | POA: Diagnosis not present

## 2019-12-28 ENCOUNTER — Encounter (HOSPITAL_COMMUNITY): Payer: Self-pay | Admitting: Emergency Medicine

## 2019-12-28 ENCOUNTER — Emergency Department (HOSPITAL_COMMUNITY): Payer: 59

## 2019-12-28 ENCOUNTER — Emergency Department (HOSPITAL_COMMUNITY)
Admission: EM | Admit: 2019-12-28 | Discharge: 2019-12-28 | Disposition: A | Discharge status: Home / Self Care | Payer: 59 | Attending: Emergency Medicine | Admitting: Emergency Medicine

## 2019-12-28 ENCOUNTER — Other Ambulatory Visit: Payer: Self-pay

## 2019-12-28 DIAGNOSIS — R0789 Other chest pain: Secondary | ICD-10-CM | POA: Insufficient documentation

## 2019-12-28 DIAGNOSIS — R509 Fever, unspecified: Secondary | ICD-10-CM | POA: Diagnosis not present

## 2019-12-28 DIAGNOSIS — E119 Type 2 diabetes mellitus without complications: Secondary | ICD-10-CM | POA: Insufficient documentation

## 2019-12-28 DIAGNOSIS — U071 COVID-19: Secondary | ICD-10-CM | POA: Diagnosis not present

## 2019-12-28 DIAGNOSIS — R000 Tachycardia, unspecified: Secondary | ICD-10-CM | POA: Diagnosis not present

## 2019-12-28 DIAGNOSIS — R079 Chest pain, unspecified: Secondary | ICD-10-CM

## 2019-12-28 DIAGNOSIS — Z794 Long term (current) use of insulin: Secondary | ICD-10-CM | POA: Insufficient documentation

## 2019-12-28 MED ORDER — HYDROCOD POLST-CPM POLST ER 10-8 MG/5ML PO SUER: 5.0000 mL | Freq: Two times a day (BID) | ORAL | 0 refills | Status: DC | PRN

## 2019-12-28 NOTE — ED Provider Notes (Signed)
[facility]-EMERGENCY DEPT Provider Note   CSN: [id number] Arrival date & time: 12/28/19  1455     History Chief Complaint  Patient presents with  . Covid+    Jill Montoya is a 24 y.o. female.  She started with "flulike" symptoms about 5 days ago.  Cough low-grade fevers body aches fatigue.  She tested positive for Covid yesterday.  Since last evening into today she has noticed pressure in her chest and some sharp sternal pains.  No nausea vomiting diarrhea.  History of diabetes.  The history is provided by the patient.  Shortness of Breath Severity:  Moderate Onset quality:  Gradual Duration:  2 days Timing:  Intermittent Progression:  Worsening Chronicity:  New Context: URI   Relieved by:  Rest Worsened by:  Activity and coughing Ineffective treatments:  None tried Associated symptoms: chest pain, cough, fever and sputum production (yellow/green)   Associated symptoms: no abdominal pain, no headaches, no neck pain, no rash, no sore throat, no vomiting and no wheezing        Past Medical History:  Diagnosis Date  . Chlamydia   . Diabetes mellitus without complication [facility])     Patient Active Problem List   Diagnosis Date Noted  . Preterm premature rupture of membranes 07/04/2018  . GBS (group b Streptococcus) UTI complicating pregnancy, third trimester 07/01/2018  . Cutaneous candidiasis 04/10/2018  . Diabetes mellitus during pregnancy, antepartum 04/07/2018    Past Surgical History:  Procedure Laterality Date  . NO PAST SURGERIES       OB History    Gravida  1   Para      Term      Preterm      AB      Living  0     SAB      TAB      Ectopic      Multiple      Live Births              Family History  Problem Relation Age of Onset  . Diabetes Mother   . Diabetes Father     Social History   Tobacco Use  . Smoking status: Never Smoker  . Smokeless tobacco: Never Used  Substance Use Topics  . Alcohol use:  No  . Drug use: Never    Home Medications Prior to Admission medications   Medication Sig Start Date End Date Taking? Authorizing Provider  Dulaglutide (TRULICITY) 1.5 MG/0.5ML SOPN Inject 1.5 mg into the skin once a week. 11/21/18   Romero Belling, MD  glucose blood (ONE TOUCH ULTRA TEST) test strip USED TO CHECK BLOOD SUGARS FOUR TIMES DAILY. 06/13/18   Romero Belling, MD  ibuprofen (ADVIL,MOTRIN) 600 MG tablet Take 1 tablet (600 mg total) by mouth every 6 (six) hours. Patient not taking: Reported on 11/16/2018 07/10/18   Myna Hidalgo, DO  insulin lispro (HUMALOG KWIKPEN) 100 UNIT/ML KwikPen Inject 0.06 mLs (6 Units total) into the skin 3 (three) times daily with meals. 11/16/18 12/16/18  Caccavale, Sophia, PA-C  nystatin cream (MYCOSTATIN) Apply to affected area 2 times daily Patient not taking: Reported on 11/21/2018 04/10/18   Raelyn Mora, CNM    Allergies    Nasonex [mometasone furoate]  Review of Systems   Review of Systems  Constitutional: Positive for fever.  HENT: Negative for sore throat.   Eyes: Negative for visual disturbance.  Respiratory: Positive for cough, sputum production (yellow/green) and shortness of breath. Negative for wheezing.  Cardiovascular: Positive for chest pain.  Gastrointestinal: Negative for abdominal pain and vomiting.  Genitourinary: Negative for dysuria.  Musculoskeletal: Negative for neck pain.  Skin: Negative for rash.  Neurological: Negative for headaches.    Physical Exam Updated Vital Signs BP 120/80 (BP Location: Right Arm)   Pulse 90   Temp 99.6 F (37.6 C) (Oral)   Resp 18   SpO2 98%   Physical Exam Vitals and nursing note reviewed.  Constitutional:      General: She is not in acute distress.    Appearance: She is well-developed.  HENT:     Head: Normocephalic and atraumatic.  Eyes:     Conjunctiva/sclera: Conjunctivae normal.  Cardiovascular:     Rate and Rhythm: Normal rate and regular rhythm.     Heart sounds: No murmur.    Pulmonary:     Effort: Pulmonary effort is normal. No respiratory distress.     Breath sounds: Normal breath sounds.  Abdominal:     Palpations: Abdomen is soft.     Tenderness: There is no abdominal tenderness.  Musculoskeletal:        General: No deformity or signs of injury. Normal range of motion.     Cervical back: Neck supple.     Right lower leg: No edema.     Left lower leg: No edema.  Skin:    General: Skin is warm and dry.     Capillary Refill: Capillary refill takes less than 2 seconds.  Neurological:     General: No focal deficit present.     Mental Status: She is alert.     ED Results / Procedures / Treatments   Labs (all labs ordered are listed, but only abnormal results are displayed) Labs Reviewed - No data to display  EKG EKG Interpretation  Date/Time:  Thursday December 28 2019 15:45:25 EST Ventricular Rate:  101 PR Interval:    QRS Duration: 75 QT Interval:  320 QTC Calculation: 415 R Axis:   73 Text Interpretation: Sinus tachycardia Consider left atrial enlargement No significant change since 9/17 Confirmed by Meridee Score [phone]) on 12/28/2019 3:49:57 PM   Radiology DG Chest Port 1 View  Result Date: 12/28/2019 CLINICAL DATA:  COVID positive. Shortness of breath. EXAM: PORTABLE CHEST 1 VIEW COMPARISON:  Chest radiograph 01/15/2017 FINDINGS: The heart size and mediastinal contours are within normal limits. The lungs are clear. No pneumothorax or pleural effusion. The visualized skeletal structures are unremarkable. IMPRESSION: No evidence of active disease in the chest. Electronically Signed   By: Emmaline Kluver M.D.   On: 12/28/2019 16:26    Procedures Procedures (including critical care time)  Medications Ordered in ED Medications - No data to display  ED Course  I have reviewed the triage vital signs and the nursing notes.  Pertinent labs & imaging results that were available during my care of the patient were reviewed by me and  considered in my medical decision making (see chart for details).  Clinical Course as of Dec 27 1925  Thu Dec 28, 2019  1528 Differential diagnosis includes Covid, musculoskeletal, pneumothorax, GERD, less likely ACS, PE   [MB]  1528 Satting 98 to 99% on room air.  Not tachypneic.  Some reproducible chest tenderness.  Will check EKG and chest x-ray.   [MB]  1614 Chest x-ray interpreted by me as no pneumothorax no gross infiltrates.  Awaiting radiology reading.   [MB]    Clinical Course User Index [MB] Terrilee Files, MD   MDM  Rules/Calculators/A&P                     Shade Rivenbark was evaluated in Emergency Department on 12/28/2019 for the symptoms described in the history of present illness. She was evaluated in the context of the global COVID-19 pandemic, which necessitated consideration that the patient might be at risk for infection with the SARS-CoV-2 virus that causes COVID-19. Institutional protocols and algorithms that pertain to the evaluation of patients at risk for COVID-19 are in a state of rapid change based on information released by regulatory bodies including the CDC and federal and state organizations. These policies and algorithms were followed during the patient's care in the ED.   Final Clinical Impression(s) / ED Diagnoses Final diagnoses:  COVID-19 virus infection  Nonspecific chest pain    Rx / DC Orders ED Discharge Orders         Ordered    chlorpheniramine-HYDROcodone (TUSSIONEX PENNKINETIC ER) 10-8 MG/5ML SUER  Every 12 hours PRN     12/28/19 1621           Hayden Rasmussen, MD 12/28/19 1928

## 2019-12-28 NOTE — Discharge Instructions (Signed)
You were seen in the emergency department for worsening shortness of breath and chest pressure along with pain.  You had an EKG and a chest x-ray that were unremarkable.  Your pulse ox was 98 to 99% on room air.  We are prescribing you some cough medicine to use to help with your cough and chest pain.  You should still continue to use Tylenol and ibuprofen as needed.  Follow-up with your doctor.  Return to the emergency department if any worsening symptoms.

## 2019-12-28 NOTE — ED Triage Notes (Signed)
Patient reports Covid+ on 2/10. States pain with inspiration. Denies cough, fever, N/V/D. Ambulatory. O2 saturation 98% after ambulating to treatment room.

## 2020-01-31 ENCOUNTER — Other Ambulatory Visit: Payer: Self-pay

## 2020-02-02 ENCOUNTER — Other Ambulatory Visit: Payer: Self-pay

## 2020-02-02 ENCOUNTER — Ambulatory Visit (INDEPENDENT_AMBULATORY_CARE_PROVIDER_SITE_OTHER): Payer: 59 | Admitting: Endocrinology

## 2020-02-02 ENCOUNTER — Encounter: Payer: Self-pay | Admitting: Endocrinology

## 2020-02-02 VITALS — BP 110/70 | HR 97 | Ht 64.0 in | Wt 184.2 lb

## 2020-02-02 DIAGNOSIS — Z794 Long term (current) use of insulin: Secondary | ICD-10-CM

## 2020-02-02 DIAGNOSIS — O24819 Other pre-existing diabetes mellitus in pregnancy, unspecified trimester: Secondary | ICD-10-CM

## 2020-02-02 DIAGNOSIS — E119 Type 2 diabetes mellitus without complications: Secondary | ICD-10-CM | POA: Diagnosis not present

## 2020-02-02 LAB — POCT GLYCOSYLATED HEMOGLOBIN (HGB A1C): Hemoglobin A1C: 10.8 % — AB (ref 4.0–5.6)

## 2020-02-02 MED ORDER — INSULIN LISPRO (1 UNIT DIAL) 100 UNIT/ML (KWIKPEN): 12.0000 [IU] | PEN_INJECTOR | Freq: Three times a day (TID) | SUBCUTANEOUS | 3 refills | Status: DC

## 2020-02-02 NOTE — Patient Instructions (Signed)
I have sent a prescription to your pharmacy, to increase the humalog to 12 units 3 times a day (just before each meal).  check your blood sugar 4 times a day: before the 3 meals, and at bedtime.  also check if you have symptoms of your blood sugar being too high or too low.  please keep a record of the readings and bring it to your next appointment here (or you can bring the meter itself).  You can write it on any piece of paper.  please call us sooner if your blood sugar goes below 70, or if you have a lot of readings over 200.   In view of your medical condition, you should avoid pregnancy until we have decided it is safe.   Please come back for a follow-up appointment in 1 month.

## 2020-02-02 NOTE — Progress Notes (Signed)
Subjective:    Patient ID: Jill Montoya, female    DOB: 02/28/1996, 24 y.o.   MRN: [id number]  HPI Pt returns for f/u of diabetes mellitus: DM type: Insulin-requiring type 2 Dx'ed: 2015 Complications: none Therapy: insulin since 2019 pregnancy GDM: never DKA: never Severe hypoglycemia: never Pancreatitis: never Pancreatic imaging: never Other: she takes multiple daily injections. Interval history: she takes humalog, 6 units, 3 times a day (just before each meal).  no cbg record, but states cbg's vary from 113-300.  pt states she feels well in general.  She is considering another pregnancy now.  She stopped Trulicity, due to burning at inject site.  Past Medical History:  Diagnosis Date  . Chlamydia   . Diabetes mellitus without complication [facility])     Past Surgical History:  Procedure Laterality Date  . NO PAST SURGERIES      Social History   Socioeconomic History  . Marital status: Single    Spouse name: Not on file  . Number of children: Not on file  . Years of education: Not on file  . Highest education level: Not on file  Occupational History  . Not on file  Tobacco Use  . Smoking status: Never Smoker  . Smokeless tobacco: Never Used  Substance and Sexual Activity  . Alcohol use: No  . Drug use: Never  . Sexual activity: Yes    Birth control/protection: None  Other Topics Concern  . Not on file  Social History Narrative  . Not on file   Social Determinants of Health   Financial Resource Strain:   . Difficulty of Paying Living Expenses:   Food Insecurity:   . Worried About Programme researcher, broadcasting/film/video in the Last Year:   . Barista in the Last Year:   Transportation Needs:   . Freight forwarder (Medical):   [name] Kitchen Lack of Transportation (Non-Medical):   Physical Activity:   . Days of Exercise per Week:   . Minutes of Exercise per Session:   Stress:   . Feeling of Stress :   Social Connections:   . Frequency of Communication with Friends and  Family:   . Frequency of Social Gatherings with Friends and Family:   . Attends Religious Services:   . Active Member of Clubs or Organizations:   . Attends Banker Meetings:   [name] Kitchen Marital Status:   Intimate Partner Violence:   . Fear of Current or Ex-Partner:   . Emotionally Abused:   [name] Kitchen Physically Abused:   . Sexually Abused:     Current Outpatient Medications on File Prior to Visit  Medication Sig Dispense Refill  . glucose blood (ONE TOUCH ULTRA TEST) test strip USED TO CHECK BLOOD SUGARS FOUR TIMES DAILY. 150 each 12  . ibuprofen (ADVIL,MOTRIN) 600 MG tablet Take 1 tablet (600 mg total) by mouth every 6 (six) hours. 30 tablet 0  . nystatin cream (MYCOSTATIN) Apply to affected area 2 times daily 30 g 0   No current facility-administered medications on file prior to visit.    Allergies  Allergen Reactions  . Nasonex [Mometasone Furoate]     "My nasal passages close up"    Family History  Problem Relation Age of Onset  . Diabetes Mother   . Diabetes Father     BP 110/70   Pulse 97   Ht 5\' 4"  (1.626 m)   Wt 184 lb 3.2 oz (83.6 kg)   SpO2 99%   BMI 31.62 kg/m  Review of Systems She denies hypoglycemia.      Objective:   Physical Exam VITAL SIGNS:  See vs page GENERAL: no distress Pulses: dorsalis pedis intact bilat.   MSK: no deformity of the feet CV: no leg edema Skin:  no ulcer on the feet.  normal color and temp on the feet. Neuro: sensation is intact to touch on the feet   Lab Results  Component Value Date   HGBA1C 10.8 (A) 02/02/2020       Assessment & Plan:  Insulin-requiring type 2 DM: severe exacerbation Injection site ADR, new Fertility: I told pt of very high risks now.  She should delay pregnancy.   Patient Instructions  I have sent a prescription to your pharmacy, to increase the humalog to 12 units 3 times a day (just before each meal).  check your blood sugar 4 times a day: before the 3 meals, and at bedtime.  also check  if you have symptoms of your blood sugar being too high or too low.  please keep a record of the readings and bring it to your next appointment here (or you can bring the meter itself).  You can write it on any piece of paper.  please call us sooner if your blood sugar goes below 70, or if you have a lot of readings over 200.   In view of your medical condition, you should avoid pregnancy until we have decided it is safe.   Please come back for a follow-up appointment in 1 month.

## 2020-02-23 DIAGNOSIS — H18823 Corneal disorder due to contact lens, bilateral: Secondary | ICD-10-CM | POA: Diagnosis not present

## 2020-02-23 DIAGNOSIS — H16293 Other keratoconjunctivitis, bilateral: Secondary | ICD-10-CM | POA: Diagnosis not present

## 2020-03-06 ENCOUNTER — Other Ambulatory Visit: Payer: Self-pay

## 2020-03-08 ENCOUNTER — Ambulatory Visit: Payer: 59 | Admitting: Endocrinology

## 2020-03-09 ENCOUNTER — Emergency Department (HOSPITAL_COMMUNITY): Payer: 59

## 2020-03-09 ENCOUNTER — Other Ambulatory Visit: Payer: Self-pay

## 2020-03-09 ENCOUNTER — Encounter (HOSPITAL_COMMUNITY): Payer: Self-pay

## 2020-03-09 ENCOUNTER — Inpatient Hospital Stay (HOSPITAL_COMMUNITY)
Admission: EM | Admit: 2020-03-09 | Discharge: 2020-03-12 | DRG: 392 | Disposition: A | Discharge status: Home / Self Care | Payer: 59 | Attending: Internal Medicine | Admitting: Internal Medicine

## 2020-03-09 DIAGNOSIS — I493 Ventricular premature depolarization: Secondary | ICD-10-CM | POA: Diagnosis present

## 2020-03-09 DIAGNOSIS — R509 Fever, unspecified: Secondary | ICD-10-CM

## 2020-03-09 DIAGNOSIS — R7989 Other specified abnormal findings of blood chemistry: Secondary | ICD-10-CM | POA: Diagnosis present

## 2020-03-09 DIAGNOSIS — K567 Ileus, unspecified: Secondary | ICD-10-CM | POA: Diagnosis not present

## 2020-03-09 DIAGNOSIS — Z8616 Personal history of COVID-19: Secondary | ICD-10-CM

## 2020-03-09 DIAGNOSIS — E1165 Type 2 diabetes mellitus with hyperglycemia: Secondary | ICD-10-CM | POA: Diagnosis present

## 2020-03-09 DIAGNOSIS — A084 Viral intestinal infection, unspecified: Principal | ICD-10-CM | POA: Diagnosis present

## 2020-03-09 DIAGNOSIS — K529 Noninfective gastroenteritis and colitis, unspecified: Secondary | ICD-10-CM | POA: Diagnosis not present

## 2020-03-09 DIAGNOSIS — Z9109 Other allergy status, other than to drugs and biological substances: Secondary | ICD-10-CM

## 2020-03-09 DIAGNOSIS — Z794 Long term (current) use of insulin: Secondary | ICD-10-CM

## 2020-03-09 DIAGNOSIS — Z833 Family history of diabetes mellitus: Secondary | ICD-10-CM

## 2020-03-09 DIAGNOSIS — R1084 Generalized abdominal pain: Secondary | ICD-10-CM

## 2020-03-09 DIAGNOSIS — R000 Tachycardia, unspecified: Secondary | ICD-10-CM | POA: Diagnosis present

## 2020-03-09 DIAGNOSIS — R9431 Abnormal electrocardiogram [ECG] [EKG]: Secondary | ICD-10-CM | POA: Diagnosis not present

## 2020-03-09 DIAGNOSIS — R52 Pain, unspecified: Secondary | ICD-10-CM

## 2020-03-09 DIAGNOSIS — R109 Unspecified abdominal pain: Secondary | ICD-10-CM | POA: Diagnosis not present

## 2020-03-09 DIAGNOSIS — E119 Type 2 diabetes mellitus without complications: Secondary | ICD-10-CM

## 2020-03-09 LAB — CBC
HCT: 46 % (ref 36.0–46.0)
Hemoglobin: 15.5 g/dL — ABNORMAL HIGH (ref 12.0–15.0)
MCH: 29.2 pg (ref 26.0–34.0)
MCHC: 33.7 g/dL (ref 30.0–36.0)
MCV: 86.6 fL (ref 80.0–100.0)
Platelets: 340 10*3/uL (ref 150–400)
RBC: 5.31 MIL/uL — ABNORMAL HIGH (ref 3.87–5.11)
RDW: 11.6 % (ref 11.5–15.5)
WBC: 8.5 10*3/uL (ref 4.0–10.5)
nRBC: 0 % (ref 0.0–0.2)

## 2020-03-09 LAB — COMPREHENSIVE METABOLIC PANEL
ALT: 15 U/L (ref 0–44)
AST: 16 U/L (ref 15–41)
Albumin: 4.2 g/dL (ref 3.5–5.0)
Alkaline Phosphatase: 69 U/L (ref 38–126)
Anion gap: 11 (ref 5–15)
BUN: 12 mg/dL (ref 6–20)
CO2: 23 mmol/L (ref 22–32)
Calcium: 8.9 mg/dL (ref 8.9–10.3)
Chloride: 101 mmol/L (ref 98–111)
Creatinine, Ser: 0.75 mg/dL (ref 0.44–1.00)
GFR calc Af Amer: >60 mL/min (ref >60)
GFR calc non Af Amer: >60 mL/min (ref >60)
Glucose, Bld: 280 mg/dL — ABNORMAL HIGH (ref 70–99)
Potassium: 3.8 mmol/L (ref 3.5–5.1)
Sodium: 135 mmol/L (ref 135–145)
Total Bilirubin: 0.9 mg/dL (ref 0.3–1.2)
Total Protein: 7.4 g/dL (ref 6.5–8.1)

## 2020-03-09 LAB — URINALYSIS, ROUTINE W REFLEX MICROSCOPIC
Bilirubin Urine: NEGATIVE
Glucose, UA: >=500 mg/dL — AB
Hgb urine dipstick: NEGATIVE
Ketones, ur: 20 mg/dL — AB
Leukocytes,Ua: NEGATIVE
Nitrite: NEGATIVE
Protein, ur: NEGATIVE mg/dL
Specific Gravity, Urine: 1.028 (ref 1.005–1.030)
pH: 5 (ref 5.0–8.0)

## 2020-03-09 LAB — CBG MONITORING, ED: Glucose-Capillary: 285 mg/dL — ABNORMAL HIGH (ref 70–99)

## 2020-03-09 LAB — I-STAT BETA HCG BLOOD, ED (MC, WL, AP ONLY): I-stat hCG, quantitative: <5 m[IU]/mL (ref <5)

## 2020-03-09 LAB — LIPASE, BLOOD: Lipase: 19 U/L (ref 11–51)

## 2020-03-09 MED ORDER — ONDANSETRON HCL 4 MG/2ML IJ SOLN
4.0000 mg | Freq: Once | INTRAMUSCULAR | Status: AC
  Administered 2020-03-09: 4 mg via INTRAVENOUS
  Filled 2020-03-09: qty 2

## 2020-03-09 MED ORDER — IOHEXOL 300 MG/ML  SOLN
100.0000 mL | Freq: Once | INTRAMUSCULAR | Status: AC | PRN
  Administered 2020-03-09: 100 mL via INTRAVENOUS

## 2020-03-09 MED ORDER — SODIUM CHLORIDE 0.9 % IV BOLUS
1000.0000 mL | Freq: Once | INTRAVENOUS | Status: AC
  Administered 2020-03-09: 1000 mL via INTRAVENOUS

## 2020-03-09 MED ORDER — SODIUM CHLORIDE 0.9 % IV BOLUS
1000.0000 mL | Freq: Once | INTRAVENOUS | Status: AC
  Administered 2020-03-10: 1000 mL via INTRAVENOUS

## 2020-03-09 MED ORDER — SODIUM CHLORIDE (PF) 0.9 % IJ SOLN
INTRAMUSCULAR | Status: AC
  Filled 2020-03-09: qty 50

## 2020-03-09 NOTE — ED Notes (Signed)
Patient transported to CT 

## 2020-03-09 NOTE — ED Provider Notes (Signed)
[facility]-EMERGENCY DEPT Provider Note   CSN: [id number] Arrival date & time: 03/09/20  1954     History Chief Complaint  Patient presents with  . Generalized Body Aches  . Abdominal Pain    Jill Montoya is a 24 y.o. female.  24 year old female with past medical history of insulin-dependent diabetes presents with complaint of vomiting, body aches, abdominal cramping and chills.  Patient states her symptoms started this morning, thought that she had a stomach virus from her daughter who was home yesterday with vomiting and diarrhea.  Patient states her daughter has made a full recovery however patient is worsening, unable to keep anything down.  Patient has not been checking her blood sugar today, no history of DKA, is not on insulin pump.        Past Medical History:  Diagnosis Date  . Chlamydia   . Diabetes mellitus without complication [facility])     Patient Active Problem List   Diagnosis Date Noted  . Preterm premature rupture of membranes 07/04/2018  . GBS (group b Streptococcus) UTI complicating pregnancy, third trimester 07/01/2018  . Cutaneous candidiasis 04/10/2018  . Diabetes mellitus during pregnancy, antepartum 04/07/2018    Past Surgical History:  Procedure Laterality Date  . NO PAST SURGERIES       OB History    Gravida  1   Para      Term      Preterm      AB      Living  0     SAB      TAB      Ectopic      Multiple      Live Births              Family History  Problem Relation Age of Onset  . Diabetes Mother   . Diabetes Father     Social History   Tobacco Use  . Smoking status: Never Smoker  . Smokeless tobacco: Never Used  Substance Use Topics  . Alcohol use: No  . Drug use: Never    Home Medications Prior to Admission medications   Medication Sig Start Date End Date Taking? Authorizing Provider  ibuprofen (ADVIL) 200 MG tablet Take 200 mg by mouth every 6 (six) hours as needed for mild  pain.   Yes [provider]  insulin lispro (HUMALOG KWIKPEN) 100 UNIT/ML KwikPen Inject 0.12 mLs (12 Units total) into the skin 3 (three) times daily with meals. And pen needles 3/day 02/02/20 03/09/20 Yes Romero Belling, MD  glucose blood (ONE TOUCH ULTRA TEST) test strip USED TO CHECK BLOOD SUGARS FOUR TIMES DAILY. 06/13/18   Romero Belling, MD  ibuprofen (ADVIL,MOTRIN) 600 MG tablet Take 1 tablet (600 mg total) by mouth every 6 (six) hours. Patient not taking: Reported on 03/09/2020 07/10/18   Myna Hidalgo, DO  nystatin cream (MYCOSTATIN) Apply to affected area 2 times daily Patient not taking: Reported on 03/09/2020 04/10/18   Raelyn Mora, CNM    Allergies    Nasonex [mometasone furoate]  Review of Systems   Review of Systems  Constitutional: Positive for chills. Negative for fever.  Respiratory: Negative for shortness of breath.   Cardiovascular: Negative for chest pain.  Gastrointestinal: Positive for abdominal pain, nausea and vomiting. Negative for blood in stool, constipation and diarrhea.  Genitourinary: Positive for decreased urine volume. Negative for difficulty urinating and dysuria.  Musculoskeletal: Positive for arthralgias and myalgias.  Skin: Negative for rash and wound.  Allergic/Immunologic:  Positive for immunocompromised state.  Neurological: Positive for weakness.  Psychiatric/Behavioral: Negative for confusion.  All other systems reviewed and are negative.   Physical Exam Updated Vital Signs BP (!) 98/46   Pulse (!) 110   Temp 98.6 F (37 C) (Oral)   Resp (!) 24   Ht 5\' 4"  (1.626 m)   Wt 83 kg   SpO2 96%   BMI 31.41 kg/m   Physical Exam Vitals and nursing note reviewed.  Constitutional:      General: She is not in acute distress.    Appearance: She is well-developed. She is not diaphoretic.  HENT:     Head: Normocephalic and atraumatic.     Mouth/Throat:     Mouth: Mucous membranes are dry.  Cardiovascular:     Rate and Rhythm: Regular  rhythm. Tachycardia present.  Pulmonary:     Effort: Pulmonary effort is normal.     Breath sounds: Normal breath sounds.  Abdominal:     Palpations: Abdomen is soft.     Tenderness: There is generalized abdominal tenderness. There is no right CVA tenderness or left CVA tenderness.     Comments: Mild generalized abdominal tenderness.  Musculoskeletal:     Right lower leg: No edema.     Left lower leg: No edema.  Skin:    General: Skin is warm and dry.     Findings: No rash.  Neurological:     Mental Status: She is alert and oriented to person, place, and time.  Psychiatric:        Behavior: Behavior normal.     ED Results / Procedures / Treatments   Labs (all labs ordered are listed, but only abnormal results are displayed) Labs Reviewed  COMPREHENSIVE METABOLIC PANEL - Abnormal; Notable for the following components:      Result Value   Glucose, Bld 280 (*)    All other components within normal limits  CBC - Abnormal; Notable for the following components:   RBC 5.31 (*)    Hemoglobin 15.5 (*)    All other components within normal limits  URINALYSIS, ROUTINE W REFLEX MICROSCOPIC - Abnormal; Notable for the following components:   Glucose, UA >=500 (*)    Ketones, ur 20 (*)    Bacteria, UA RARE (*)    All other components within normal limits  CBG MONITORING, ED - Abnormal; Notable for the following components:   Glucose-Capillary 285 (*)    All other components within normal limits  LIPASE, BLOOD  LACTIC ACID, PLASMA  LACTIC ACID, PLASMA  I-STAT BETA HCG BLOOD, ED ([facility], [facility], [facility] ONLY)  POC SARS CORONAVIRUS 2 AG -  ED    EKG EKG Interpretation  Date/Time:  Saturday March 09 2020 20:18:39 EDT Ventricular Rate:  135 PR Interval:    QRS Duration: 63 QT Interval:  265 QTC Calculation: 398 R Axis:   80 Text Interpretation: Sinus tachycardia Ventricular premature complex Low voltage, precordial leads Borderline T wave abnormalities 12 Lead; Mason-Likar No significant  change since last tracing Confirmed by [id number] [phone]) on 03/09/2020 10:16:30 PM   Radiology No results found.  Procedures Procedures (including critical care time)  Medications Ordered in ED Medications  sodium chloride 0.9 % bolus 1,000 mL (has no administration in time range)  sodium chloride (PF) 0.9 % injection (has no administration in time range)  sodium chloride 0.9 % bolus 1,000 mL (0 mLs Intravenous Stopped 03/09/20 2332)  ondansetron (ZOFRAN) injection 4 mg (4 mg Intravenous Given 03/09/20 2058)  sodium chloride 0.9 % bolus 1,000 mL ( Intravenous Stopped 03/09/20 2325)  iohexol (OMNIPAQUE) 300 MG/ML solution 100 mL (100 mLs Intravenous Contrast Given 03/09/20 2332)    ED Course  I have reviewed the triage vital signs and the nursing notes.  Pertinent labs & imaging results that were available during my care of the patient were reviewed by me and considered in my medical decision making (see chart for details).  Clinical Course as of Mar 09 2349  Sat Mar 10, 4619  5442 24 year old diabetic female with abdominal cramping, body aches, vomiting today.  On exam has mild generalized abdominal tenderness, is tachycardic and afebrile with normal blood pressure.  Concern for DKA secondary to viral illness.  Patient is trying to get pregnant, plan to hold off pain medications until hCG results. Patient was given IV fluids and Zofran, CMP with glucose of 280, otherwise normal bicarb and anion gap.  CBC with normal white blood cell count, lipase within normal limits, urine positive for glucose and ketones.  hCG is negative. On recheck, patient remains tachycardic, now with soft blood pressure with systolic of 90 0:27 liters of fluid.  Repeat abdominal exam, now has right lower quadrant abdominal tenderness.  Plan is to CT abdomen pelvis to evaluate for appendicitis, give third liter of fluid.  Case discussed with Dr. Tyrone Nine, ED attending who agrees with plan of care.   [LM]  2350 Care  signed out pending Ct/fluids, further monitor for abnormal vitals.   [LM]    Clinical Course User Index [LM] Roque Lias   MDM Rules/Calculators/A&P                      Final Clinical Impression(s) / ED Diagnoses Final diagnoses:  None    Rx / DC Orders ED Discharge Orders    None       Tacy Learn, PA-C 03/09/20 Piney, Williams, DO 03/14/20 [phone]

## 2020-03-09 NOTE — ED Notes (Signed)
Patient ambulatory to restroom  ?

## 2020-03-09 NOTE — ED Triage Notes (Signed)
Pt daughter had a stomach bug. Pt thinks she has same. Generalized body aches, chills, denies fever. Worse in abdomen. Multiple episodes of vomiting.

## 2020-03-10 ENCOUNTER — Emergency Department (HOSPITAL_COMMUNITY): Payer: 59

## 2020-03-10 ENCOUNTER — Observation Stay (HOSPITAL_COMMUNITY): Payer: 59

## 2020-03-10 ENCOUNTER — Encounter (HOSPITAL_COMMUNITY): Payer: Self-pay

## 2020-03-10 DIAGNOSIS — Z833 Family history of diabetes mellitus: Secondary | ICD-10-CM | POA: Diagnosis not present

## 2020-03-10 DIAGNOSIS — R9431 Abnormal electrocardiogram [ECG] [EKG]: Secondary | ICD-10-CM | POA: Diagnosis not present

## 2020-03-10 DIAGNOSIS — Z794 Long term (current) use of insulin: Secondary | ICD-10-CM

## 2020-03-10 DIAGNOSIS — R7989 Other specified abnormal findings of blood chemistry: Secondary | ICD-10-CM | POA: Diagnosis present

## 2020-03-10 DIAGNOSIS — E119 Type 2 diabetes mellitus without complications: Secondary | ICD-10-CM

## 2020-03-10 DIAGNOSIS — R000 Tachycardia, unspecified: Secondary | ICD-10-CM | POA: Diagnosis not present

## 2020-03-10 DIAGNOSIS — K529 Noninfective gastroenteritis and colitis, unspecified: Secondary | ICD-10-CM

## 2020-03-10 DIAGNOSIS — K567 Ileus, unspecified: Secondary | ICD-10-CM | POA: Diagnosis present

## 2020-03-10 DIAGNOSIS — Z8616 Personal history of COVID-19: Secondary | ICD-10-CM | POA: Diagnosis not present

## 2020-03-10 DIAGNOSIS — I493 Ventricular premature depolarization: Secondary | ICD-10-CM | POA: Diagnosis present

## 2020-03-10 DIAGNOSIS — Z9109 Other allergy status, other than to drugs and biological substances: Secondary | ICD-10-CM | POA: Diagnosis not present

## 2020-03-10 DIAGNOSIS — E1165 Type 2 diabetes mellitus with hyperglycemia: Secondary | ICD-10-CM | POA: Diagnosis present

## 2020-03-10 DIAGNOSIS — A084 Viral intestinal infection, unspecified: Secondary | ICD-10-CM | POA: Diagnosis present

## 2020-03-10 DIAGNOSIS — R109 Unspecified abdominal pain: Secondary | ICD-10-CM | POA: Diagnosis not present

## 2020-03-10 DIAGNOSIS — R112 Nausea with vomiting, unspecified: Secondary | ICD-10-CM | POA: Diagnosis not present

## 2020-03-10 LAB — D-DIMER, QUANTITATIVE: D-Dimer, Quant: 0.68 ug/mL-FEU — ABNORMAL HIGH (ref 0.00–0.50)

## 2020-03-10 LAB — ECHOCARDIOGRAM COMPLETE
Height: 64 in
Weight: 2960 oz

## 2020-03-10 LAB — CBC
HCT: 36.3 % (ref 36.0–46.0)
Hemoglobin: 12.2 g/dL (ref 12.0–15.0)
MCH: 29.1 pg (ref 26.0–34.0)
MCHC: 33.6 g/dL (ref 30.0–36.0)
MCV: 86.6 fL (ref 80.0–100.0)
Platelets: 275 10*3/uL (ref 150–400)
RBC: 4.19 MIL/uL (ref 3.87–5.11)
RDW: 11.7 % (ref 11.5–15.5)
WBC: 3.4 10*3/uL — ABNORMAL LOW (ref 4.0–10.5)
nRBC: 0 % (ref 0.0–0.2)

## 2020-03-10 LAB — BASIC METABOLIC PANEL
Anion gap: 9 (ref 5–15)
BUN: 9 mg/dL (ref 6–20)
CO2: 20 mmol/L — ABNORMAL LOW (ref 22–32)
Calcium: 7.2 mg/dL — ABNORMAL LOW (ref 8.9–10.3)
Chloride: 105 mmol/L (ref 98–111)
Creatinine, Ser: 0.59 mg/dL (ref 0.44–1.00)
GFR calc Af Amer: >60 mL/min (ref >60)
GFR calc non Af Amer: >60 mL/min (ref >60)
Glucose, Bld: 206 mg/dL — ABNORMAL HIGH (ref 70–99)
Potassium: 3.2 mmol/L — ABNORMAL LOW (ref 3.5–5.1)
Sodium: 134 mmol/L — ABNORMAL LOW (ref 135–145)

## 2020-03-10 LAB — LACTIC ACID, PLASMA: Lactic Acid, Venous: 1.5 mmol/L (ref 0.5–1.9)

## 2020-03-10 LAB — MAGNESIUM: Magnesium: 1.2 mg/dL — ABNORMAL LOW (ref 1.7–2.4)

## 2020-03-10 LAB — HIV ANTIBODY (ROUTINE TESTING W REFLEX): HIV Screen 4th Generation wRfx: NONREACTIVE

## 2020-03-10 LAB — GLUCOSE, CAPILLARY
Glucose-Capillary: 191 mg/dL — ABNORMAL HIGH (ref 70–99)
Glucose-Capillary: 216 mg/dL — ABNORMAL HIGH (ref 70–99)
Glucose-Capillary: 220 mg/dL — ABNORMAL HIGH (ref 70–99)
Glucose-Capillary: 181 mg/dL — ABNORMAL HIGH (ref 70–99)

## 2020-03-10 LAB — PROCALCITONIN: Procalcitonin: 1.02 ng/mL

## 2020-03-10 LAB — CBG MONITORING, ED: Glucose-Capillary: 268 mg/dL — ABNORMAL HIGH (ref 70–99)

## 2020-03-10 LAB — TROPONIN I (HIGH SENSITIVITY): Troponin I (High Sensitivity): <2 ng/L (ref <18)

## 2020-03-10 LAB — TSH: TSH: 0.446 u[IU]/mL (ref 0.350–4.500)

## 2020-03-10 MED ORDER — HYDROMORPHONE HCL 1 MG/ML IJ SOLN
0.5000 mg | INTRAMUSCULAR | Status: DC | PRN
  Administered 2020-03-10 – 2020-03-11 (×5): 0.5 mg via INTRAVENOUS
  Filled 2020-03-10 (×5): qty 0.5

## 2020-03-10 MED ORDER — POTASSIUM CHLORIDE CRYS ER 20 MEQ PO TBCR
40.0000 meq | EXTENDED_RELEASE_TABLET | Freq: Once | ORAL | Status: AC
  Administered 2020-03-10: 40 meq via ORAL
  Filled 2020-03-10: qty 2

## 2020-03-10 MED ORDER — FAMOTIDINE IN NACL 20-0.9 MG/50ML-% IV SOLN
20.0000 mg | Freq: Two times a day (BID) | INTRAVENOUS | Status: DC
  Administered 2020-03-10 – 2020-03-11 (×4): 20 mg via INTRAVENOUS
  Filled 2020-03-10 (×4): qty 50

## 2020-03-10 MED ORDER — ENOXAPARIN SODIUM 40 MG/0.4ML ~~LOC~~ SOLN
40.0000 mg | SUBCUTANEOUS | Status: DC
  Administered 2020-03-10 – 2020-03-12 (×3): 40 mg via SUBCUTANEOUS
  Filled 2020-03-10 (×3): qty 0.4

## 2020-03-10 MED ORDER — POTASSIUM CHLORIDE 10 MEQ/100ML IV SOLN
10.0000 meq | INTRAVENOUS | Status: DC
  Administered 2020-03-10 (×2): 10 meq via INTRAVENOUS
  Filled 2020-03-10 (×3): qty 100

## 2020-03-10 MED ORDER — MORPHINE SULFATE (PF) 4 MG/ML IV SOLN
4.0000 mg | Freq: Once | INTRAVENOUS | Status: AC
  Administered 2020-03-10: 4 mg via INTRAVENOUS
  Filled 2020-03-10: qty 1

## 2020-03-10 MED ORDER — SODIUM CHLORIDE (PF) 0.9 % IJ SOLN
INTRAMUSCULAR | Status: AC
  Filled 2020-03-10: qty 50

## 2020-03-10 MED ORDER — PROMETHAZINE HCL 25 MG PO TABS
12.5000 mg | ORAL_TABLET | Freq: Four times a day (QID) | ORAL | Status: DC | PRN
  Administered 2020-03-11: 12.5 mg via ORAL
  Filled 2020-03-10: qty 1

## 2020-03-10 MED ORDER — MAGNESIUM SULFATE 2 GM/50ML IV SOLN
2.0000 g | Freq: Once | INTRAVENOUS | Status: AC
  Administered 2020-03-10: 2 g via INTRAVENOUS
  Filled 2020-03-10: qty 50

## 2020-03-10 MED ORDER — ACETAMINOPHEN 325 MG PO TABS
650.0000 mg | ORAL_TABLET | Freq: Four times a day (QID) | ORAL | Status: DC | PRN
  Administered 2020-03-10 (×2): 650 mg via ORAL
  Filled 2020-03-10 (×2): qty 2

## 2020-03-10 MED ORDER — ONDANSETRON HCL 4 MG/2ML IJ SOLN
4.0000 mg | Freq: Four times a day (QID) | INTRAMUSCULAR | Status: DC | PRN
  Administered 2020-03-10: 4 mg via INTRAVENOUS
  Filled 2020-03-10: qty 2

## 2020-03-10 MED ORDER — ONDANSETRON HCL 4 MG/2ML IJ SOLN
4.0000 mg | Freq: Once | INTRAMUSCULAR | Status: AC
  Administered 2020-03-10: 4 mg via INTRAVENOUS
  Filled 2020-03-10: qty 2

## 2020-03-10 MED ORDER — INSULIN ASPART 100 UNIT/ML ~~LOC~~ SOLN
0.0000 [IU] | SUBCUTANEOUS | Status: DC
  Administered 2020-03-10 (×2): 3 [IU] via SUBCUTANEOUS
  Administered 2020-03-10: 5 [IU] via SUBCUTANEOUS
  Administered 2020-03-10 – 2020-03-11 (×8): 2 [IU] via SUBCUTANEOUS
  Administered 2020-03-12: 1 [IU] via SUBCUTANEOUS
  Administered 2020-03-12 (×3): 2 [IU] via SUBCUTANEOUS
  Filled 2020-03-10: qty 0.09

## 2020-03-10 MED ORDER — IOHEXOL 350 MG/ML SOLN
80.0000 mL | Freq: Once | INTRAVENOUS | Status: AC | PRN
  Administered 2020-03-10: 80 mL via INTRAVENOUS

## 2020-03-10 MED ORDER — SODIUM CHLORIDE 0.9 % IV SOLN: INTRAVENOUS | Status: AC

## 2020-03-10 MED ORDER — SODIUM CHLORIDE 0.9% FLUSH
3.0000 mL | Freq: Two times a day (BID) | INTRAVENOUS | Status: DC
  Administered 2020-03-10 – 2020-03-11 (×3): 3 mL via INTRAVENOUS

## 2020-03-10 MED ORDER — ACETAMINOPHEN 325 MG PO TABS
650.0000 mg | ORAL_TABLET | Freq: Once | ORAL | Status: AC
  Administered 2020-03-10: 650 mg via ORAL
  Filled 2020-03-10: qty 2

## 2020-03-10 NOTE — ED Notes (Signed)
ED TO INPATIENT HANDOFF REPORT  ED Nurse Name and Phone #: Huntley Dec [phone]  S Name/Age/Gender Jill Montoya 24 y.o. female Room/Bed: WA23/WA23  Code Status   Code Status: Prior  Home/SNF/Other Home Patient oriented to: self, place, time and situation Is this baseline? Yes   Triage Complete: Triage complete  Chief Complaint Acute gastroenteritis [K52.9]  Triage Note Pt daughter had a stomach bug. Pt thinks she has same. Generalized body aches, chills, denies fever. Worse in abdomen. Multiple episodes of vomiting.     Allergies Allergies  Allergen Reactions  . Nasonex [Mometasone Furoate]     "My nasal passages close up"    Level of Care/Admitting Diagnosis ED Disposition    ED Disposition Condition Comment   Admit  Hospital Area: [facility] [[id number]]  Level of Care: Telemetry [5]  Admit to tele based on following criteria: Complex arrhythmia (Bradycardia/Tachycardia)  Covid Evaluation: Recent COVID positive no isolation required infection day 21-90  Diagnosis: Acute gastroenteritis [454098]  Admitting Physician: Briscoe Deutscher [[id number]]  Attending Physician: Briscoe Deutscher [[id number]]       B Medical/Surgery History Past Medical History:  Diagnosis Date  . Chlamydia   . Diabetes mellitus without complication [facility])    Past Surgical History:  Procedure Laterality Date  . NO PAST SURGERIES       A IV Location/Drains/Wounds Patient Lines/Drains/Airways Status   Active Line/Drains/Airways    Name:   Placement date:   Placement time:   Site:   Days:   Peripheral IV 03/09/20 Left Antecubital   03/09/20    2053    Antecubital   1          Intake/Output Last 24 hours  Intake/Output Summary (Last 24 hours) at 03/10/2020 0535 Last data filed at 03/10/2020 0247 Gross per 24 hour  Intake 3199.72 ml  Output --  Net 3199.72 ml    Labs/Imaging Results for orders placed or performed during the hospital encounter of 03/09/20 (from the past  48 hour(s))  CBG monitoring, ED     Status: Abnormal   Collection Time: 03/09/20  8:47 PM  Result Value Ref Range   Glucose-Capillary 285 (H) 70 - 99 mg/dL    Comment: Glucose reference range applies only to samples taken after fasting for at least 8 hours.   Comment 1 Notify RN    Comment 2 Document in Chart   Lipase, blood     Status: None   Collection Time: 03/09/20  9:02 PM  Result Value Ref Range   Lipase 19 11 - 51 U/L    Comment: Performed at [facility], [address], [place], [place] [address]  Comprehensive metabolic panel     Status: Abnormal   Collection Time: 03/09/20  9:02 PM  Result Value Ref Range   Sodium 135 135 - 145 mmol/L   Potassium 3.8 3.5 - 5.1 mmol/L   Chloride 101 98 - 111 mmol/L   CO2 23 22 - 32 mmol/L   Glucose, Bld 280 (H) 70 - 99 mg/dL    Comment: Glucose reference range applies only to samples taken after fasting for at least 8 hours.   BUN 12 6 - 20 mg/dL   Creatinine, Ser 8.46 0.44 - 1.00 mg/dL   Calcium 8.9 8.9 - 96.2 mg/dL   Total Protein 7.4 6.5 - 8.1 g/dL   Albumin 4.2 3.5 - 5.0 g/dL   AST 16 15 - 41 U/L   ALT 15 0 - 44 U/L  Alkaline Phosphatase 69 38 - 126 U/L   Total Bilirubin 0.9 0.3 - 1.2 mg/dL   GFR calc non Af Amer >60 >60 mL/min   GFR calc Af Amer >60 >60 mL/min   Anion gap 11 5 - 15    Comment: Performed at [facility], [address], [place], [place] [address]  CBC     Status: Abnormal   Collection Time: 03/09/20  9:02 PM  Result Value Ref Range   WBC 8.5 4.0 - 10.5 K/uL   RBC 5.31 (H) 3.87 - 5.11 MIL/uL   Hemoglobin 15.5 (H) 12.0 - 15.0 g/dL   HCT 47.4 25.9 - 56.3 %   MCV 86.6 80.0 - 100.0 fL   MCH 29.2 26.0 - 34.0 pg   MCHC 33.7 30.0 - 36.0 g/dL   RDW 87.5 64.3 - 32.9 %   Platelets 340 150 - 400 K/uL   nRBC 0.0 0.0 - 0.2 %    Comment: Performed at [facility], [address] [place], [place] [address]  Urinalysis, Routine w reflex microscopic     Status:  Abnormal   Collection Time: 03/09/20  9:03 PM  Result Value Ref Range   Color, Urine YELLOW YELLOW   APPearance CLEAR CLEAR   Specific Gravity, Urine 1.028 1.005 - 1.030   pH 5.0 5.0 - 8.0   Glucose, UA >=500 (A) NEGATIVE mg/dL   Hgb urine dipstick NEGATIVE NEGATIVE   Bilirubin Urine NEGATIVE NEGATIVE   Ketones, ur 20 (A) NEGATIVE mg/dL   Protein, ur NEGATIVE NEGATIVE mg/dL   Nitrite NEGATIVE NEGATIVE   Leukocytes,Ua NEGATIVE NEGATIVE   RBC / HPF 0-5 0 - 5 RBC/hpf   WBC, UA 0-5 0 - 5 WBC/hpf   Bacteria, UA RARE (A) NONE SEEN   Squamous Epithelial / LPF 0-5 0 - 5   Mucus PRESENT     Comment: Performed at [facility], [address] [place], [place] [address]  I-Stat beta hCG blood, ED     Status: None   Collection Time: 03/09/20  9:10 PM  Result Value Ref Range   I-stat hCG, quantitative <5.0 <5 mIU/mL   Comment 3            Comment:   GEST. AGE      CONC.  (mIU/mL)   <=1 WEEK        5 - 50     2 WEEKS       50 - 500     3 WEEKS       100 - 10,000     4 WEEKS     1,000 - 30,000        FEMALE AND NON-PREGNANT FEMALE:     LESS THAN 5 mIU/mL   Lactic acid, plasma     Status: None   Collection Time: 03/09/20  9:10 PM  Result Value Ref Range   Lactic Acid, Venous 1.5 0.5 - 1.9 mmol/L    Comment: Performed at [facility], [address], [place], [place] [address]  D-dimer, quantitative (not at [facility])     Status: Abnormal   Collection Time: 03/09/20  9:10 PM  Result Value Ref Range   D-Dimer, Quant 0.68 (H) 0.00 - 0.50 ug/mL-FEU    Comment: (NOTE) At the manufacturer cut-off of 0.50 ug/mL FEU, this assay has been documented to exclude PE with a sensitivity and negative predictive value of 97 to 99%.  At this time, this assay has  not been approved by the FDA to exclude DVT/VTE. Results should be correlated with clinical presentation. Performed at [facility], [place] [address], [place], [place] [address]   Troponin I  (High Sensitivity)     Status: None   Collection Time: 03/09/20  9:10 PM  Result Value Ref Range   Troponin I (High Sensitivity) <2 <18 ng/L    Comment: (NOTE) Elevated high sensitivity troponin I (hsTnI) values and significant  changes across serial measurements may suggest ACS but many other  chronic and acute conditions are known to elevate hsTnI results.  Refer to the "Links" section for chest pain algorithms and additional  guidance. Performed at [facility], [place] [address], [place], [place] [address]   CBG monitoring, ED     Status: Abnormal   Collection Time: 03/10/20  4:05 AM  Result Value Ref Range   Glucose-Capillary 268 (H) 70 - 99 mg/dL    Comment: Glucose reference range applies only to samples taken after fasting for at least 8 hours.   CT Angio Chest PE W and/or Wo Contrast  Result Date: 03/10/2020 CLINICAL DATA:  Tachycardia and elevated D-dimer. EXAM: CT ANGIOGRAPHY CHEST WITH CONTRAST TECHNIQUE: Multidetector CT imaging of the chest was performed using the standard protocol during bolus administration of intravenous contrast. Multiplanar CT image reconstructions and MIPs were obtained to evaluate the vascular anatomy. CONTRAST:  73mL OMNIPAQUE IOHEXOL 350 MG/ML SOLN COMPARISON:  None. FINDINGS: Cardiovascular: Satisfactory opacification of the pulmonary arteries to the segmental level. No evidence of pulmonary embolism. Normal heart size. No pericardial effusion. Mediastinum/Nodes: No enlarged mediastinal, hilar, or axillary lymph nodes. Thyroid gland, trachea, and esophagus demonstrate no significant findings. Lungs/Pleura: Lungs are clear. No pleural effusion or pneumothorax. Upper Abdomen: No acute abnormality. Musculoskeletal: No chest wall abnormality. No acute or significant osseous findings. Review of the MIP images confirms the above findings. IMPRESSION: No CT evidence of pulmonary embolism or active cardiopulmonary disease. Electronically Signed    By: Virgina Norfolk M.D.   On: 03/10/2020 02:45   CT Abdomen Pelvis W Contrast  Result Date: 03/09/2020 CLINICAL DATA:  Right lower quadrant abdominal pain chills EXAM: CT ABDOMEN AND PELVIS WITH CONTRAST TECHNIQUE: Multidetector CT imaging of the abdomen and pelvis was performed using the standard protocol following bolus administration of intravenous contrast. CONTRAST:  116mL OMNIPAQUE IOHEXOL 300 MG/ML  SOLN COMPARISON:  None. FINDINGS: Lower chest: The visualized heart size within normal limits. No pericardial fluid/thickening. No hiatal hernia. The visualized portions of the lungs are clear. Hepatobiliary: The liver is normal in density without focal abnormality.The main portal vein is patent. No evidence of calcified gallstones, gallbladder wall thickening or biliary dilatation. Pancreas: Unremarkable. No pancreatic ductal dilatation or surrounding inflammatory changes. Spleen: Normal in size without focal abnormality. Adrenals/Urinary Tract: Both adrenal glands appear normal. The kidneys and collecting system appear normal without evidence of urinary tract calculus or hydronephrosis. Bladder is unremarkable. Stomach/Bowel: The stomach is normal in appearance. There are several loops of mildly dilated fluid-filled loops of jejunum measuring up to 3.2 cm. No wall thickening or focal area of narrowing is noted. The distal jejunal loops and ileal loops are decompressed. The colon is normal in appearance with a moderate amount of colonic stool. The appendix is grossly unremarkable. Vascular/Lymphatic: There are no enlarged mesenteric, retroperitoneal, or pelvic lymph nodes. No significant vascular findings are present. Reproductive: The uterus and adnexa are unremarkable. a small amount of physiologic free fluid in the deep cul-de-sac. Other: No evidence  of abdominal wall mass or hernia. Musculoskeletal: No acute or significant osseous findings. IMPRESSION: Several mildly dilated fluid-filled loops of  jejunum within the mid abdomen without a focal transition point or wall thickening. This is likely due to ileus. Normal appearing appendix. Electronically Signed   By: Jonna ClarkBindu  Avutu M.D.   On: 03/09/2020 23:50   DG Chest Port 1 View  Result Date: 03/10/2020 CLINICAL DATA:  Tachycardia and generalized body aches. EXAM: PORTABLE CHEST 1 VIEW COMPARISON:  December 28, 2019 FINDINGS: The heart size and mediastinal contours are within normal limits. Both lungs are clear. The visualized skeletal structures are unremarkable. IMPRESSION: No active disease. Electronically Signed   By: Aram Candelahaddeus  Houston M.D.   On: 03/10/2020 00:18    Pending Labs Unresulted Labs (From admission, onward)    Start     Ordered   03/10/20 0305  TSH  Add-on,   AD     03/10/20 0304   Signed and Held  HIV Antibody (routine testing w rflx)  (HIV Antibody (Routine testing w reflex) panel)  Tomorrow morning,   R     Signed and Held   Signed and Held  Creatinine, serum  (enoxaparin (LOVENOX)    CrCl >/= 30 ml/min)  Weekly,   R    Comments: while on enoxaparin therapy    Signed and Held   Signed and Held  Magnesium  Tomorrow morning,   R     Signed and Held   Signed and Held  Basic metabolic panel  Tomorrow morning,   R     Signed and Held   Signed and Held  CBC  Tomorrow morning,   R     Signed and Held          Vitals/Pain Today's Vitals   03/10/20 0430 03/10/20 0435 03/10/20 0500 03/10/20 0530  BP: (!) 106/58  (!) 111/58 108/62  Pulse: 99  92 97  Resp: (!) 22  (!) 24 19  Temp:      TempSrc:      SpO2: 96%  97% 97%  Weight:      Height:      PainSc:  8       Isolation Precautions No active isolations  Medications Medications  acetaminophen (TYLENOL) tablet 650 mg (has no administration in time range)  ondansetron (ZOFRAN) injection 4 mg (has no administration in time range)  insulin aspart (novoLOG) injection 0-9 Units (5 Units Subcutaneous Given 03/10/20 0450)  famotidine (PEPCID) IVPB 20 mg premix (20  mg Intravenous New Bag/Given 03/10/20 0446)  0.9 %  sodium chloride infusion ( Intravenous New Bag/Given 03/10/20 0448)  sodium chloride 0.9 % bolus 1,000 mL (0 mLs Intravenous Stopped 03/09/20 2332)  ondansetron (ZOFRAN) injection 4 mg (4 mg Intravenous Given 03/09/20 2058)  sodium chloride 0.9 % bolus 1,000 mL ( Intravenous Stopped 03/09/20 2325)  sodium chloride 0.9 % bolus 1,000 mL (0 mLs Intravenous Stopped 03/10/20 0247)  iohexol (OMNIPAQUE) 300 MG/ML solution 100 mL (100 mLs Intravenous Contrast Given 03/09/20 2332)  sodium chloride (PF) 0.9 % injection (  Given by Other 03/10/20 0349)  morphine 4 MG/ML injection 4 mg (4 mg Intravenous Given 03/10/20 0159)  iohexol (OMNIPAQUE) 350 MG/ML injection 80 mL (80 mLs Intravenous Contrast Given 03/10/20 0215)  sodium chloride (PF) 0.9 % injection (  Given by Other 03/10/20 0349)  acetaminophen (TYLENOL) tablet 650 mg (650 mg Oral Given 03/10/20 0310)  morphine 4 MG/ML injection 4 mg (4 mg Intravenous Given 03/10/20 0348)  ondansetron [facility]) injection 4 mg (4 mg Intravenous Given 03/10/20 0348)    Mobility walks Low fall risk   Focused Assessments    R Recommendations: See Admitting Provider Note  Report given to:   Additional Notes:

## 2020-03-10 NOTE — H&P (Signed)
- History and Physical    Jill Montoya ZOX:[id number] DOB: 1996/07/12 DOA: 03/09/2020  PCP: Renford Dills, MD   Patient coming from: Home   Chief Complaint: Chills, aches, N/V   HPI: Jill Montoya is a 24 y.o. female with medical history significant for poorly controlled insulin-dependent diabetes mellitus, COVID-19 infection in February 2021 from which she has since recovered, now presenting to the emergency department with 1 day of chills, aches, cramping abdominal discomfort, nausea, and nonbloody vomiting.  Patient reports that her young daughter was experiencing nausea, vomiting, and diarrhea couple days ago, and then the patient herself developed the aforementioned symptoms yesterday.  Abdominal discomfort is generalized, waxes and wanes, and cramping in nature.  She denies diarrhea, melena, or hematochezia.  She denies shortness of breath, cough, rhinorrhea, or sore throat.  ED Course: Upon arrival to the ED, patient is found to be febrile to 38.2 C, tachycardic to the 130s, and with blood pressure 94/48.  EKG features sinus tachycardia with rate 135 and PVC.  Chest x-ray is negative for acute cardiopulmonary disease.  Chemistry panel notable for glucose of 280.  CBC unremarkable.  Lactic acid reassuringly normal.  Urinalysis with glucosuria and ketonuria.  CTA chest is negative for PE or other acute cardiopulmonary disease.  CT the abdomen pelvis demonstrates several mildly dilated fluid-filled loops of jejunum without focal transition point or wall thickening and thought to reflect ileus.  Patient was given acetaminophen, Zofran, morphine, and 3 L normal saline in the ED.  Review of Systems:  All other systems reviewed and apart from HPI, are negative.  Past Medical History:  Diagnosis Date  . Chlamydia   . Diabetes mellitus without complication [facility])     Past Surgical History:  Procedure Laterality Date  . NO PAST SURGERIES       reports that she has never smoked. She has never  used smokeless tobacco. She reports that she does not drink alcohol or use drugs.  Allergies  Allergen Reactions  . Nasonex [Mometasone Furoate]     "My nasal passages close up"    Family History  Problem Relation Age of Onset  . Diabetes Mother   . Diabetes Father      Prior to Admission medications   Medication Sig Start Date End Date Taking? Authorizing Provider  ibuprofen (ADVIL) 200 MG tablet Take 200 mg by mouth every 6 (six) hours as needed for mild pain.   Yes [provider]  insulin lispro (HUMALOG KWIKPEN) 100 UNIT/ML KwikPen Inject 0.12 mLs (12 Units total) into the skin 3 (three) times daily with meals. And pen needles 3/day 02/02/20 03/09/20 Yes Romero Belling, MD  glucose blood (ONE TOUCH ULTRA TEST) test strip USED TO CHECK BLOOD SUGARS FOUR TIMES DAILY. 06/13/18   Romero Belling, MD    Physical Exam: Vitals:   03/10/20 0230 03/10/20 0259 03/10/20 0300 03/10/20 0330  BP: (!) 106/53  111/60 110/64  Pulse: (!) 120  (!) 122 (!) 117  Resp: (!) 22  18 (!) 23  Temp:  (!) 100.8 F (38.2 C)    TempSrc:  Oral    SpO2: 97%  97% 97%  Weight:      Height:        Constitutional: NAD, calm  Eyes: PERTLA, lids and conjunctivae normal ENMT: Mucous membranes are moist. Posterior pharynx clear of any exudate or lesions.   Neck: normal, supple, no masses, no thyromegaly Respiratory: clear to auscultation bilaterally, no wheezing, no crackles. No accessory muscle use.  Cardiovascular: Rate ~120 and regular. No extremity edema.  Abdomen: No distension, soft, mild generalized tenderness without rebound pain or guarding. Bowel sounds active.  Musculoskeletal: no clubbing / cyanosis. No joint deformity upper and lower extremities.   Skin: no significant rashes, lesions, ulcers. Warm, dry, well-perfused. Neurologic: No facial asymmetry. Sensation intact. Moving all extremities.  Psychiatric: Alert and oriented to person, place, and situation. Very pleasant and cooperative.     Labs and Imaging on Admission: I have personally reviewed following labs and imaging studies  CBC: Recent Labs  Lab 03/09/20 2102  WBC 8.5  HGB 15.5*  HCT 46.0  MCV 86.6  PLT 340   Basic Metabolic Panel: Recent Labs  Lab 03/09/20 2102  NA 135  K 3.8  CL 101  CO2 23  GLUCOSE 280*  BUN 12  CREATININE 0.75  CALCIUM 8.9   GFR: Estimated Creatinine Clearance: 114 mL/min (by C-G formula based on SCr of 0.75 mg/dL). Liver Function Tests: Recent Labs  Lab 03/09/20 2102  AST 16  ALT 15  ALKPHOS 69  BILITOT 0.9  PROT 7.4  ALBUMIN 4.2   Recent Labs  Lab 03/09/20 2102  LIPASE 19   No results for input(s): AMMONIA in the last 168 hours. Coagulation Profile: No results for input(s): INR, PROTIME in the last 168 hours. Cardiac Enzymes: No results for input(s): CKTOTAL, CKMB, CKMBINDEX, TROPONINI in the last 168 hours. BNP (last 3 results) No results for input(s): PROBNP in the last 8760 hours. HbA1C: No results for input(s): HGBA1C in the last 72 hours. CBG: Recent Labs  Lab 03/09/20 2047  GLUCAP 285*   Lipid Profile: No results for input(s): CHOL, HDL, LDLCALC, TRIG, CHOLHDL, LDLDIRECT in the last 72 hours. Thyroid Function Tests: No results for input(s): TSH, T4TOTAL, FREET4, T3FREE, THYROIDAB in the last 72 hours. Anemia Panel: No results for input(s): VITAMINB12, FOLATE, FERRITIN, TIBC, IRON, RETICCTPCT in the last 72 hours. Urine analysis:    Component Value Date/Time   COLORURINE YELLOW 03/09/2020 2103   APPEARANCEUR CLEAR 03/09/2020 2103   LABSPEC 1.028 03/09/2020 2103   PHURINE 5.0 03/09/2020 2103   GLUCOSEU >=500 (A) 03/09/2020 2103   HGBUR NEGATIVE 03/09/2020 2103   BILIRUBINUR NEGATIVE 03/09/2020 2103   KETONESUR 20 (A) 03/09/2020 2103   PROTEINUR NEGATIVE 03/09/2020 2103   NITRITE NEGATIVE 03/09/2020 2103   LEUKOCYTESUR NEGATIVE 03/09/2020 2103   Sepsis Labs: @LABRCNTIP (procalcitonin:4,lacticidven:4) )No results found for this or any  previous visit (from the past 240 hour(s)).   Radiological Exams on Admission: CT Angio Chest PE W and/or Wo Contrast  Result Date: 03/10/2020 CLINICAL DATA:  Tachycardia and elevated D-dimer. EXAM: CT ANGIOGRAPHY CHEST WITH CONTRAST TECHNIQUE: Multidetector CT imaging of the chest was performed using the standard protocol during bolus administration of intravenous contrast. Multiplanar CT image reconstructions and MIPs were obtained to evaluate the vascular anatomy. CONTRAST:  65mL OMNIPAQUE IOHEXOL 350 MG/ML SOLN COMPARISON:  None. FINDINGS: Cardiovascular: Satisfactory opacification of the pulmonary arteries to the segmental level. No evidence of pulmonary embolism. Normal heart size. No pericardial effusion. Mediastinum/Nodes: No enlarged mediastinal, hilar, or axillary lymph nodes. Thyroid gland, trachea, and esophagus demonstrate no significant findings. Lungs/Pleura: Lungs are clear. No pleural effusion or pneumothorax. Upper Abdomen: No acute abnormality. Musculoskeletal: No chest wall abnormality. No acute or significant osseous findings. Review of the MIP images confirms the above findings. IMPRESSION: No CT evidence of pulmonary embolism or active cardiopulmonary disease. Electronically Signed   By: 91m M.D.   On: 03/10/2020 02:45  CT Abdomen Pelvis W Contrast  Result Date: 03/09/2020 CLINICAL DATA:  Right lower quadrant abdominal pain chills EXAM: CT ABDOMEN AND PELVIS WITH CONTRAST TECHNIQUE: Multidetector CT imaging of the abdomen and pelvis was performed using the standard protocol following bolus administration of intravenous contrast. CONTRAST:  149mL OMNIPAQUE IOHEXOL 300 MG/ML  SOLN COMPARISON:  None. FINDINGS: Lower chest: The visualized heart size within normal limits. No pericardial fluid/thickening. No hiatal hernia. The visualized portions of the lungs are clear. Hepatobiliary: The liver is normal in density without focal abnormality.The main portal vein is patent. No  evidence of calcified gallstones, gallbladder wall thickening or biliary dilatation. Pancreas: Unremarkable. No pancreatic ductal dilatation or surrounding inflammatory changes. Spleen: Normal in size without focal abnormality. Adrenals/Urinary Tract: Both adrenal glands appear normal. The kidneys and collecting system appear normal without evidence of urinary tract calculus or hydronephrosis. Bladder is unremarkable. Stomach/Bowel: The stomach is normal in appearance. There are several loops of mildly dilated fluid-filled loops of jejunum measuring up to 3.2 cm. No wall thickening or focal area of narrowing is noted. The distal jejunal loops and ileal loops are decompressed. The colon is normal in appearance with a moderate amount of colonic stool. The appendix is grossly unremarkable. Vascular/Lymphatic: There are no enlarged mesenteric, retroperitoneal, or pelvic lymph nodes. No significant vascular findings are present. Reproductive: The uterus and adnexa are unremarkable. a small amount of physiologic free fluid in the deep cul-de-sac. Other: No evidence of abdominal wall mass or hernia. Musculoskeletal: No acute or significant osseous findings. IMPRESSION: Several mildly dilated fluid-filled loops of jejunum within the mid abdomen without a focal transition point or wall thickening. This is likely due to ileus. Normal appearing appendix. Electronically Signed   By: Prudencio Pair M.D.   On: 03/09/2020 23:50   DG Chest Port 1 View  Result Date: 03/10/2020 CLINICAL DATA:  Tachycardia and generalized body aches. EXAM: PORTABLE CHEST 1 VIEW COMPARISON:  December 28, 2019 FINDINGS: The heart size and mediastinal contours are within normal limits. Both lungs are clear. The visualized skeletal structures are unremarkable. IMPRESSION: No active disease. Electronically Signed   By: Virgina Norfolk M.D.   On: 03/10/2020 00:18    EKG: Independently reviewed. Sinus tachycardia, rate 135, PVC.    Assessment/Plan  1. Fevers, nausea, and vomiting  - Presents with one day of chills, aches, cramping abdominal discomfort, and N/V after her daughter had a similar illness  - She is found to be febrile and tachycardic with hyperglycemia but otherwise normal chemistries, no leukocytosis, reassuringly normal lactate, possible mild ileus on CT  - She remains tachycardic after 3 liters saline in ED  - Clinically, acute viral gastroenteritis most likely though she has not developed diarrhea yet  - Continue supportive care with IVF hydration, antiemetics and antipyretics, monitor electrolytes    2. Insulin-dependent DM  - A1c was 10.8% in March 2021  - Managed at home with Humalog 12 units TID  - Currently unable to tolerate oral intake, will check CBGs and use SSI for now     DVT prophylaxis: Lovenox  Code Status: Full  Family Communication: Discussed with patient  Disposition Plan:  Patient is from: Home  Anticipated d/c is to: Home  Anticipated d/c date is: 03/11/20 Patient currently: Requiring IVF hydration until able to tolerate adequate oral intake  Consults called: None  Admission status: Observation     Vianne Bulls, MD [facility] Pager: See [email or web address]  If 7AM-7PM, please contact the daytime attending [email or web address]  03/10/2020, 3:57 AM

## 2020-03-10 NOTE — Progress Notes (Signed)
   03/10/20 1034  Assess: MEWS Score  Temp 99.3 F (37.4 C)  BP 112/79  Pulse Rate (!) 112  Resp 16  O2 Device Room Air  Assess: MEWS Score  MEWS Temp 0  MEWS Systolic 0  MEWS Pulse 2  MEWS RR 0  MEWS LOC 0  MEWS Score 2  MEWS Score Color Yellow  Assess: if the MEWS score is Yellow or Red  Were vital signs taken at a resting state? Yes  Focused Assessment Documented focused assessment  Early Detection of Sepsis Score *See Row Information* Medium  MEWS guidelines implemented *See Row Information* Yes  Treat  MEWS Interventions Other (Comment) (md aware)  Take Vital Signs  Increase Vital Sign Frequency  Yellow: Q 2hr X 2 then Q 4hr X 2, if remains yellow, continue Q 4hrs  Escalate  MEWS: Escalate Yellow: discuss with charge nurse/RN and consider discussing with provider and RRT  Notify: Charge Nurse/RN  Name of Charge Nurse/RN Notified Ommie Degeorge, RN  Date Charge Nurse/RN Notified 03/10/20  Time Charge Nurse/RN Notified 1053  Notify: Provider  Provider Name/Title T. Opyd  Date Provider Notified 03/10/20  Time Provider Notified 1030  Notification Type Page  Notification Reason Change in status  Response No new orders   Pt elevated HR. MD aware.

## 2020-03-10 NOTE — ED Notes (Signed)
Lab to add on d-dimer and troponin

## 2020-03-10 NOTE — Plan of Care (Signed)

## 2020-03-10 NOTE — Progress Notes (Signed)
  Echocardiogram 2D Echocardiogram has been performed.  Jill Montoya 03/10/2020, 10:06 AM

## 2020-03-10 NOTE — ED Provider Notes (Signed)
Care assumed from Jill Montoya, [place].  Please see her full H&P.  In short,  Jill Montoya is a 24 y.o. female presents for chills, body aches, nausea and vomiting.  Patient is an insulin-dependent diabetic however no indication of DKA today.  Somewhat hyper glycemic.  Fluids given.  Afterwards, patient's tachycardia persists and she has had mild hypotension.  She is tachypneic.  Third liter of fluid ordered.  She is had some worsening abdominal pain here in the emergency department.  Plan: Additional lab work, third liter of fluid, CT abdomen to rule out appendicitis.  Physical Exam  BP (!) 98/46   Pulse (!) 110   Temp 98.6 F (37 C) (Oral)   Resp (!) 24   Ht 5\' 4"  (1.626 m)   Wt 83 kg   LMP 02/08/2020   SpO2 96%   BMI 31.41 kg/m   Physical Exam Vitals and nursing note reviewed.  Constitutional:      General: She is not in acute distress.    Appearance: She is well-developed. She is ill-appearing.  HENT:     Head: Normocephalic.  Eyes:     General: No scleral icterus.    Conjunctiva/sclera: Conjunctivae normal.  Cardiovascular:     Rate and Rhythm: Tachycardia present.  Pulmonary:     Effort: Pulmonary effort is normal.  Abdominal:     Palpations: Abdomen is soft.     Tenderness: There is generalized abdominal tenderness.  Musculoskeletal:        General: Normal range of motion.     Cervical back: Normal range of motion.  Skin:    General: Skin is warm and dry.  Neurological:     Mental Status: She is alert.     ED Course/Procedures   Clinical Course as of Mar 10 346  Sat Mar 09, 5166  5951 24 year old diabetic female with abdominal cramping, body aches, vomiting today.  On exam has mild generalized abdominal tenderness, is tachycardic and afebrile with normal blood pressure.  Concern for DKA secondary to viral illness.  Patient is trying to get pregnant, plan to hold off pain medications until hCG results. Patient was given IV fluids and Zofran, CMP with glucose of 280,  otherwise normal bicarb and anion gap.  CBC with normal white blood cell count, lipase within normal limits, urine positive for glucose and ketones.  hCG is negative. On recheck, patient remains tachycardic, now with soft blood pressure with systolic of 90 2:83 liters of fluid.  Repeat abdominal exam, now has right lower quadrant abdominal tenderness.  Plan is to CT abdomen pelvis to evaluate for appendicitis, give third liter of fluid.  Case discussed with Dr. Tyrone Nine, ED attending who agrees with plan of care.   [LM]  2350 Care signed out pending Ct/fluids, further monitor for abnormal vitals.   [LM]  Sun Mar 10, 2020  0131 Elevated.  Patient persistently tachycardic.  CT scan ordered for further evaluation of possible pulmonary embolism.  D-Dimer, Quant(!): 0.68 [HM]    Clinical Course User Index [HM] Jill Montoya, Jill Soho, PA-C [LM] Tacy Learn, PA-C    Procedures  MDM    Patient presents with nausea, vomiting and persistent tachycardia.  On further history taking patient reports she had Covid in February.  Concern for myocarditis, pulmonary embolism, sepsis from viral gastritis.  Additional labs and fluids pending.  On my evaluation, she is sitting in bed with a heart rate of greater than 130.  She denies shortness of breath or chest pain.  12:30  AM CT scan with mild ileus but no evidence of appendicitis.  3:45 AM Patient continues to be tachycardic.  Elevated D-dimer.  CT scan without evidence of pulmonary embolism.  She is febrile on oral recheck of temperature and refuses rectal.  Tylenol given for fever.  Heart rate continues to be between 120 and 130.  Increasing abdominal pain.  Additional pain medications given.  Patient will need admission for ileus, persistent abdominal pain, fever and tachycardia.  Discussed patient's case with hospitalist, Dr. Antionette Char.  I have recommended admission and patient (and family if present) agree with this plan. Admitting physician will place  admission orders.    Normal lactic acid.  No source of infection at this time.  Sepsis not initiated.  Tachycardia  Generalized abdominal pain  Ileus (HCC)  Fever, unspecified fever cause       Jill Montoya 03/10/20 0348    Molpus, Jonny Ruiz, MD 03/10/20 [phone]

## 2020-03-10 NOTE — Progress Notes (Signed)
24 year old female with history of poorly controlled type I diabetic Covid in February 2021 which was treated as an outpatient comes in with complaints of nausea vomiting and abdominal pain.  She has a 13-month-old daughter who was also sick and has been having the same symptoms at home. Patient remains nauseated, on IV fluids.  And has been n.p.o.  Continues to have abdominal pain and diarrhea. Abdomen with generalized tenderness. Work-up showed a CT of the abdomen and pelvis with mildly dilated fluid-filled loops of jejunum without focal transition point.  CT of the chest was done as she was very tachycardic which did not reveal any evidence of pulmonary bolus him or other acute cardiopulmonary disease. Today her blood pressure is soft on IV fluids patient remains tachycardic intermittently tachypneic. ?  Viral syndrome. Labs today sodium 134 potassium 3.2 BUN 9 creatinine 0.59 magnesium 1.2 blood sugar is 206 white count 3.4.  Normal TSH. UA shows ketones and glucose.  No evidence of UTI. Normal lactic acid Plan to continue IV fluids, will try clear liquid diet echocardiogram. Check blood cultures and lactic acid and procalcitonin. Patient needs to tolerate a diet and her tachycardia under control prior to discharge.

## 2020-03-11 ENCOUNTER — Inpatient Hospital Stay (HOSPITAL_COMMUNITY): Payer: 59

## 2020-03-11 DIAGNOSIS — Z794 Long term (current) use of insulin: Secondary | ICD-10-CM | POA: Diagnosis not present

## 2020-03-11 DIAGNOSIS — R000 Tachycardia, unspecified: Secondary | ICD-10-CM | POA: Diagnosis not present

## 2020-03-11 DIAGNOSIS — Z8616 Personal history of COVID-19: Secondary | ICD-10-CM | POA: Diagnosis not present

## 2020-03-11 DIAGNOSIS — K567 Ileus, unspecified: Secondary | ICD-10-CM | POA: Diagnosis not present

## 2020-03-11 DIAGNOSIS — A084 Viral intestinal infection, unspecified: Secondary | ICD-10-CM | POA: Diagnosis not present

## 2020-03-11 DIAGNOSIS — E1165 Type 2 diabetes mellitus with hyperglycemia: Secondary | ICD-10-CM | POA: Diagnosis not present

## 2020-03-11 DIAGNOSIS — Z833 Family history of diabetes mellitus: Secondary | ICD-10-CM | POA: Diagnosis not present

## 2020-03-11 DIAGNOSIS — R7989 Other specified abnormal findings of blood chemistry: Secondary | ICD-10-CM | POA: Diagnosis not present

## 2020-03-11 DIAGNOSIS — Z9109 Other allergy status, other than to drugs and biological substances: Secondary | ICD-10-CM | POA: Diagnosis not present

## 2020-03-11 LAB — GLUCOSE, CAPILLARY
Glucose-Capillary: 155 mg/dL — ABNORMAL HIGH (ref 70–99)
Glucose-Capillary: 160 mg/dL — ABNORMAL HIGH (ref 70–99)
Glucose-Capillary: 171 mg/dL — ABNORMAL HIGH (ref 70–99)
Glucose-Capillary: 172 mg/dL — ABNORMAL HIGH (ref 70–99)
Glucose-Capillary: 176 mg/dL — ABNORMAL HIGH (ref 70–99)
Glucose-Capillary: 178 mg/dL — ABNORMAL HIGH (ref 70–99)
Glucose-Capillary: 178 mg/dL — ABNORMAL HIGH (ref 70–99)

## 2020-03-11 LAB — PROCALCITONIN: Procalcitonin: 0.2 ng/mL

## 2020-03-11 MED ORDER — TRAMADOL HCL 50 MG PO TABS
50.0000 mg | ORAL_TABLET | Freq: Four times a day (QID) | ORAL | Status: DC | PRN
  Administered 2020-03-11: 50 mg via ORAL
  Filled 2020-03-11: qty 1

## 2020-03-11 MED ORDER — SODIUM CHLORIDE 0.9 % IV SOLN: INTRAVENOUS | Status: DC

## 2020-03-11 MED ORDER — PANTOPRAZOLE SODIUM 40 MG IV SOLR
40.0000 mg | Freq: Two times a day (BID) | INTRAVENOUS | Status: DC
  Administered 2020-03-11 – 2020-03-12 (×3): 40 mg via INTRAVENOUS
  Filled 2020-03-11 (×3): qty 40

## 2020-03-11 MED ORDER — ONDANSETRON HCL 4 MG/2ML IJ SOLN
4.0000 mg | Freq: Four times a day (QID) | INTRAMUSCULAR | Status: DC
  Administered 2020-03-11 – 2020-03-12 (×2): 4 mg via INTRAVENOUS
  Filled 2020-03-11 (×2): qty 2

## 2020-03-11 NOTE — Consult Note (Signed)
Referring Provider: Dr. Blanchard ManeElizabeth Matthews Primary Care Physician:  Renford DillsPolite, Ronald, MD Primary Gastroenterologist:  Gentry FitzUnassigned  Reason for Consultation:  Nausea/vomiting, abdominal pain  HPI: Jill Montoya is a 24 y.o. female with history of DM type II and recent COVID-19 infection (February 2021, resolved) presenting with chief complaint of nausea, vomiting, and abdominal pain.  Patient reports that her daughter was sick with gastroenteritis on Friday 4/23.  Patient became sick on Saturday 4/24.  On Saturday and Sunday, she had numerous episodes of nonbloody, nonbilious vomiting.  She has not had any vomiting today and only has mild nausea.  She also had two loose bowel movements yesterday.    She has also noted epigastric and bilateral lower quadrant abdominal pain.  She states the pain is persisting or worse in her epigastric region.  Patient denies hematemesis, dysphagia, heartburn, changes in appetite, unexplained weight loss, hematochezia, melena.  Patient reports she was able to tolerate spaghetti today without any nausea or vomiting.  Patient has never had an EGD or colonoscopy.  Past Medical History:  Diagnosis Date  . Chlamydia   . Diabetes mellitus without complication [facility](HCC)     Past Surgical History:  Procedure Laterality Date  . NO PAST SURGERIES      Prior to Admission medications   Medication Sig Start Date End Date Taking? Authorizing Provider  glucose blood (ONE TOUCH ULTRA TEST) test strip USED TO CHECK BLOOD SUGARS FOUR TIMES DAILY. 06/13/18  Yes Romero BellingEllison, Sean, MD  insulin lispro (HUMALOG KWIKPEN) 100 UNIT/ML KwikPen Inject 0.12 mLs (12 Units total) into the skin 3 (three) times daily with meals. And pen needles 3/day 02/02/20 03/09/20 Yes Romero BellingEllison, Sean, MD  ibuprofen (ADVIL) 200 MG tablet Take 200 mg by mouth every 6 (six) hours as needed for mild pain.    [provider]    Scheduled Meds: . enoxaparin (LOVENOX) injection  40 mg Subcutaneous Q24H  .  insulin aspart  0-9 Units Subcutaneous Q4H  . ondansetron (ZOFRAN) IV  4 mg Intravenous Q6H  . pantoprazole (PROTONIX) IV  40 mg Intravenous Q12H  . sodium chloride flush  3 mL Intravenous Q12H   Continuous Infusions: . sodium chloride 75 mL/hr at 03/11/20 1400   PRN Meds:.acetaminophen, HYDROmorphone (DILAUDID) injection, promethazine  Allergies as of 03/09/2020 - Review Complete 03/09/2020  Allergen Reaction Noted  . Nasonex [mometasone furoate]  01/14/2017    Family History  Problem Relation Age of Onset  . Diabetes Mother   . Diabetes Father     Social History   Socioeconomic History  . Marital status: Married    Spouse name: Not on file  . Number of children: Not on file  . Years of education: Not on file  . Highest education level: Not on file  Occupational History  . Not on file  Tobacco Use  . Smoking status: Never Smoker  . Smokeless tobacco: Never Used  Substance and Sexual Activity  . Alcohol use: No  . Drug use: Never  . Sexual activity: Yes    Birth control/protection: None  Other Topics Concern  . Not on file  Social History Narrative  . Not on file   Social Determinants of Health   Financial Resource Strain:   . Difficulty of Paying Living Expenses:   Food Insecurity:   . Worried About Programme researcher, broadcasting/film/videounning Out of Food in the Last Year:   . Baristaan Out of Food in the Last Year:   Transportation Needs:   . Freight forwarderLack of Transportation (Medical):   .[name] Kitchen  Lack of Transportation (Non-Medical):   Physical Activity:   . Days of Exercise per Week:   . Minutes of Exercise per Session:   Stress:   . Feeling of Stress :   Social Connections:   . Frequency of Communication with Friends and Family:   . Frequency of Social Gatherings with Friends and Family:   . Attends Religious Services:   . Active Member of Clubs or Organizations:   . Attends Banker Meetings:   [name] Kitchen Marital Status:   Intimate Partner Violence:   . Fear of Current or Ex-Partner:   . Emotionally  Abused:   [name] Kitchen Physically Abused:   . Sexually Abused:     Review of Systems: Review of Systems  Constitutional: Positive for chills and malaise/fatigue. Negative for fever.  HENT: Negative for hearing loss and tinnitus.   Eyes: Negative for blurred vision and double vision.  Respiratory: Negative for cough and shortness of breath.   Cardiovascular: Negative for chest pain and palpitations.  Gastrointestinal: Positive for abdominal pain (epigastric, LLQ, RLQ), diarrhea, nausea and vomiting. Negative for blood in stool, constipation, heartburn and melena.  Genitourinary: Negative for dysuria and hematuria.  Musculoskeletal: Negative for falls and joint pain.  Skin: Negative for itching and rash.  Neurological: Negative for dizziness and loss of consciousness.  Endo/Heme/Allergies: Negative for polydipsia. Does not bruise/bleed easily.  Psychiatric/Behavioral: Negative for depression. The patient is not nervous/anxious.      Physical Exam: Vital signs: Vitals:   03/11/20 0515 03/11/20 1100  BP: 111/63 (!) 103/59  Pulse: 79 70  Resp: 18 18  Temp: 97.8 F (36.6 C) 97.8 F (36.6 C)  SpO2: 100% 98%   Last BM Date: 03/10/20  Physical Exam  Constitutional: She is oriented to person, place, and time. She appears well-developed and well-nourished. No distress.  HENT:  Head: Normocephalic and atraumatic.  Eyes: Conjunctivae and EOM are normal. No scleral icterus.  Cardiovascular: Normal rate, regular rhythm and normal heart sounds.  Pulmonary/Chest: Effort normal and breath sounds normal. No respiratory distress.  Abdominal: Soft. Bowel sounds are normal. She exhibits no distension and no mass. There is abdominal tenderness (epigastric, LLQ, RLQ). There is no rebound and no guarding.  Musculoskeletal:        General: No deformity or edema.     Cervical back: Normal range of motion and neck supple.  Neurological: She is alert and oriented to person, place, and time.  Skin: Skin is  warm and dry.  Psychiatric: She has a normal mood and affect. Her behavior is normal.    GI:  Lab Results: Recent Labs    03/09/20 2102 03/10/20 0724  WBC 8.5 3.4*  HGB 15.5* 12.2  HCT 46.0 36.3  PLT 340 275   BMET Recent Labs    03/09/20 2102 03/10/20 0724  NA 135 134*  K 3.8 3.2*  CL 101 105  CO2 23 20*  GLUCOSE 280* 206*  BUN 12 9  CREATININE 0.75 0.59  CALCIUM 8.9 7.2*   LFT Recent Labs    03/09/20 2102  PROT 7.4  ALBUMIN 4.2  AST 16  ALT 15  ALKPHOS 69  BILITOT 0.9   PT/INR No results for input(s): LABPROT, INR in the last 72 hours.   Studies/Results: DG Abd 1 View  Result Date: 03/11/2020 CLINICAL DATA:  Abdominal pain and nausea EXAM: ABDOMEN - 1 VIEW COMPARISON:  None. FINDINGS: The bowel gas pattern is normal. No radio-opaque calculi or other significant radiographic abnormality are seen.  IMPRESSION: Negative. Electronically Signed   By: Marlan Palau M.D.   On: 03/11/2020 08:34   CT Angio Chest PE W and/or Wo Contrast  Result Date: 03/10/2020 CLINICAL DATA:  Tachycardia and elevated D-dimer. EXAM: CT ANGIOGRAPHY CHEST WITH CONTRAST TECHNIQUE: Multidetector CT imaging of the chest was performed using the standard protocol during bolus administration of intravenous contrast. Multiplanar CT image reconstructions and MIPs were obtained to evaluate the vascular anatomy. CONTRAST:  65mL OMNIPAQUE IOHEXOL 350 MG/ML SOLN COMPARISON:  None. FINDINGS: Cardiovascular: Satisfactory opacification of the pulmonary arteries to the segmental level. No evidence of pulmonary embolism. Normal heart size. No pericardial effusion. Mediastinum/Nodes: No enlarged mediastinal, hilar, or axillary lymph nodes. Thyroid gland, trachea, and esophagus demonstrate no significant findings. Lungs/Pleura: Lungs are clear. No pleural effusion or pneumothorax. Upper Abdomen: No acute abnormality. Musculoskeletal: No chest wall abnormality. No acute or significant osseous findings. Review of  the MIP images confirms the above findings. IMPRESSION: No CT evidence of pulmonary embolism or active cardiopulmonary disease. Electronically Signed   By: Aram Candela M.D.   On: 03/10/2020 02:45   CT Abdomen Pelvis W Contrast  Result Date: 03/09/2020 CLINICAL DATA:  Right lower quadrant abdominal pain chills EXAM: CT ABDOMEN AND PELVIS WITH CONTRAST TECHNIQUE: Multidetector CT imaging of the abdomen and pelvis was performed using the standard protocol following bolus administration of intravenous contrast. CONTRAST:  OMNIPAQUE IOHEXOL 300 MG/ML  SOLN COMPARISON:  None. FINDINGS: Lower chest: The visualized heart size within normal limits. No pericardial fluid/thickening. No hiatal hernia. The visualized portions of the lungs are clear. Hepatobiliary: The liver is normal in density without focal abnormality.The main portal vein is patent. No evidence of calcified gallstones, gallbladder wall thickening or biliary dilatation. Pancreas: Unremarkable. No pancreatic ductal dilatation or surrounding inflammatory changes. Spleen: Normal in size without focal abnormality. Adrenals/Urinary Tract: Both adrenal glands appear normal. The kidneys and collecting system appear normal without evidence of urinary tract calculus or hydronephrosis. Bladder is unremarkable. Stomach/Bowel: The stomach is normal in appearance. There are several loops of mildly dilated fluid-filled loops of jejunum measuring up to 3.2 cm. No wall thickening or focal area of narrowing is noted. The distal jejunal loops and ileal loops are decompressed. The colon is normal in appearance with a moderate amount of colonic stool. The appendix is grossly unremarkable. Vascular/Lymphatic: There are no enlarged mesenteric, retroperitoneal, or pelvic lymph nodes. No significant vascular findings are present. Reproductive: The uterus and adnexa are unremarkable. a small amount of physiologic free fluid in the deep cul-de-sac. Other: No evidence of  abdominal wall mass or hernia. Musculoskeletal: No acute or significant osseous findings. IMPRESSION: Several mildly dilated fluid-filled loops of jejunum within the mid abdomen without a focal transition point or wall thickening. This is likely due to ileus. Normal appearing appendix. Electronically Signed   By: Jonna Clark M.D.   On: 03/09/2020 23:50   DG Chest Port 1 View  Result Date: 03/10/2020 CLINICAL DATA:  Tachycardia and generalized body aches. EXAM: PORTABLE CHEST 1 VIEW COMPARISON:  December 28, 2019 FINDINGS: The heart size and mediastinal contours are within normal limits. Both lungs are clear. The visualized skeletal structures are unremarkable. IMPRESSION: No active disease. Electronically Signed   By: Aram Candela M.D.   On: 03/10/2020 00:18   ECHOCARDIOGRAM COMPLETE  Result Date: 03/10/2020    ECHOCARDIOGRAM REPORT   Patient Name:   SAMANTHA RAGEN Date of Exam: 03/10/2020 Medical Rec #:  [id number]     Height:  64.0 in Accession #:    [id number]    Weight:       185.0 lb Date of Birth:  Mar 24, 1996     BSA:          1.893 m Patient Age:    23 years      BP:           115/71 mmHg Patient Gender: F             HR:           100 bpm. Exam Location:  Inpatient Procedure: 2D Echo Indications:    794.31 abnormal ECG  History:        Patient has no prior history of Echocardiogram examinations.                 Risk Factors:Diabetes.  Sonographer:    Jannett Celestine RDCS (AE) Referring Phys: [id number] Noland Fordyce MATHEWS IMPRESSIONS  1. Left ventricular ejection fraction, by estimation, is 60 to 65%. The left ventricle has normal function. The left ventricle has no regional wall motion abnormalities. Indeterminate diastolic filling due to E-A fusion, but appears grossly normal.  2. Right ventricular systolic function is normal. The right ventricular size is normal. Tricuspid regurgitation signal is inadequate for assessing PA pressure.  3. The mitral valve is normal in structure. No evidence of  mitral valve regurgitation. No evidence of mitral stenosis.  4. The aortic valve is normal in structure. Aortic valve regurgitation is not visualized. No aortic stenosis is present.  5. The inferior vena cava is normal in size with <50% respiratory variability, suggesting right atrial pressure of 8 mmHg. Conclusion(s)/Recommendation(s): Normal biventricular function without evidence of hemodynamically significant valvular heart disease. FINDINGS  Left Ventricle: Left ventricular ejection fraction, by estimation, is 60 to 65%. The left ventricle has normal function. The left ventricle has no regional wall motion abnormalities. The left ventricular internal cavity size was normal in size. There is  no left ventricular hypertrophy. Indeterminate diastolic filling due to E-A fusion. Right Ventricle: The right ventricular size is normal. No increase in right ventricular wall thickness. Right ventricular systolic function is normal. Tricuspid regurgitation signal is inadequate for assessing PA pressure. Left Atrium: Left atrial size was normal in size. Right Atrium: Right atrial size was normal in size. Pericardium: There is no evidence of pericardial effusion. Mitral Valve: The mitral valve is normal in structure. Normal mobility of the mitral valve leaflets. No evidence of mitral valve regurgitation. No evidence of mitral valve stenosis. Tricuspid Valve: The tricuspid valve is normal in structure. Tricuspid valve regurgitation is not demonstrated. No evidence of tricuspid stenosis. Aortic Valve: The aortic valve is normal in structure. Aortic valve regurgitation is not visualized. No aortic stenosis is present. Pulmonic Valve: The pulmonic valve was normal in structure. Pulmonic valve regurgitation is not visualized. No evidence of pulmonic stenosis. Aorta: The aortic root is normal in size and structure. Venous: The inferior vena cava is normal in size with less than 50% respiratory variability, suggesting right atrial  pressure of 8 mmHg. IAS/Shunts: The interatrial septum was not well visualized.  LEFT VENTRICLE PLAX 2D LVIDd:         4.80 cm  Diastology LVIDs:         2.50 cm  LV e' lateral: 14.80 cm/s LV PW:         0.90 cm LV IVS:        0.70 cm LVOT diam:     1.70 cm LV SV:  39 LV SV Index:   21 LVOT Area:     2.27 cm  RIGHT VENTRICLE RV S prime:     17.40 cm/s TAPSE (M-mode): 1.8 cm LEFT ATRIUM             Index       RIGHT ATRIUM           Index LA diam:        3.30 cm 1.74 cm/m  RA Area:     12.30 cm LA Vol (A2C):   29.9 ml 15.80 ml/m RA Volume:   25.90 ml  13.68 ml/m LA Vol (A4C):   28.8 ml 15.22 ml/m LA Biplane Vol: 30.4 ml 16.06 ml/m  AORTIC VALVE LVOT Vmax:   112.00 cm/s LVOT Vmean:  69.500 cm/s LVOT VTI:    0.174 m  AORTA Ao Root diam: 2.50 cm  SHUNTS Systemic VTI:  0.17 m Systemic Diam: 1.70 cm Weston Brass MD Electronically signed by Weston Brass MD Signature Date/Time: 03/10/2020/1:21:21 PM    Final     Impression/ Acute gastroenteritis, suspect infectious etiology. Nausea/vomiting predominance, possibly Norovirus. N/V has improved.  Patient tolerating a soft diet. Abdominal pain persists but is mild.   Plan:  -GI pathogen panel ordered -Continue Protonix BID -Continue supportive care and IVF    LOS: 1 day   Edrick Kins  PA-C 03/11/2020, 3:14 PM  Contact #  [phone]

## 2020-03-11 NOTE — Progress Notes (Signed)
PROGRESS NOTE    Jill Montoya  ALP:[id number] DOB: Dec 01, 1995 DOA: 03/09/2020 PCP: Seward Carol, MD    Chief Complaint  Patient presents with  . Generalized Body Aches  . Abdominal Pain    Brief Narrative:  24 y.o. female with medical history significant for poorly controlled insulin-dependent diabetes mellitus, COVID-19 infection in February 2021 from which she has since recovered, now presenting to the emergency department with 1 day of chills, aches, cramping abdominal discomfort, nausea, and nonbloody vomiting.  Patient reports that her young daughter was experiencing nausea, vomiting, and diarrhea couple days ago, and then the patient herself developed the aforementioned symptoms yesterday.  Abdominal discomfort is generalized, waxes and wanes, and cramping in nature.  She denies diarrhea, melena, or hematochezia.  She denies shortness of breath, cough, rhinorrhea, or sore throat.  ED Course: Upon arrival to the ED, patient is found to be febrile to 38.2 C, tachycardic to the 130s, and with blood pressure 94/48.  EKG features sinus tachycardia with rate 135 and PVC.  Chest x-ray is negative for acute cardiopulmonary disease.  Chemistry panel notable for glucose of 280.  CBC unremarkable.  Lactic acid reassuringly normal.  Urinalysis with glucosuria and ketonuria.  CTA chest is negative for PE or other acute cardiopulmonary disease.  CT the abdomen pelvis demonstrates several mildly dilated fluid-filled loops of jejunum without focal transition point or wall thickening and thought to reflect ileus.  Patient was given acetaminophen, Zofran, morphine, and 3 L normal saline in the ED.   Assessment & Plan:   Principal Problem:   Acute gastroenteritis Active Problems:   Insulin-requiring or dependent type II diabetes mellitus ([place])   #1  Intractable nausea vomiting and diarrhea -patient continues with nausea and epigastric pain.  She might have gastritis or an ulcer.  Will consult GI.   Continue Protonix.  Continue Zofran.  Patient continues to complain of abdominal pain.  #2  Insulin-dependent diabetes-continue SSI. CBG (last 3)  Recent Labs    03/11/20 0514 03/11/20 0809 03/11/20 1213  GLUCAP 155* 176* 178*      DVT prophylaxis: Lovenox   code Status: Full code  family Communication discussed with patient disposition: Patient came from home plan is to discharge her home.  Anticipated DC date unknown.  Barriers to discharge patient continues with nausea vomiting abdominal pain      Consultants: Consulted GI 03/11/2020  Procedures: None Antimicrobials: None  Subjective: Continues to complain of abdominal pain with nausea   Objective: Vitals:   03/10/20 1834 03/10/20 2235 03/11/20 0515 03/11/20 1100  BP: 103/67 (!) 97/56 111/63 (!) 103/59  Pulse: 93 88 79 70  Resp: 16 18 18 18   Temp: 98.9 F (37.2 C) 98.7 F (37.1 C) 97.8 F (36.6 C) 97.8 F (36.6 C)  TempSrc: Oral Oral Oral Oral  SpO2: 98% 99% 100% 98%  Weight:      Height:        Intake/Output Summary (Last 24 hours) at 03/11/2020 1320 Last data filed at 03/11/2020 0200 Gross per 24 hour  Intake 150 ml  Output --  Net 150 ml   Filed Weights   03/09/20 2005 03/10/20 0626  Weight: 83 kg 83.9 kg    Examination:  General exam: Appears calm and comfortable  Respiratory system: Clear to auscultation. Respiratory effort normal. Cardiovascular system: S1 & S2 heard, RRR. No JVD, murmurs, rubs, gallops or clicks. No pedal edema. Gastrointestinal system: Abdomen is nondistended, soft and epigastric tender. No organomegaly or masses felt. Normal  bowel sounds heard. Central nervous system: Alert and oriented. No focal neurological deficits. Extremities: Symmetric 5 x 5 power. Skin: No rashes, lesions or ulcers Psychiatry: Judgement and insight appear normal. Mood & affect appropriate.     Data Reviewed: I have personally reviewed following labs and imaging studies  CBC: Recent Labs   Lab 03/09/20 2102 03/10/20 0724  WBC 8.5 3.4*  HGB 15.5* 12.2  HCT 46.0 36.3  MCV 86.6 86.6  PLT 340 275    Basic Metabolic Panel: Recent Labs  Lab 03/09/20 2102 03/10/20 0724  NA 135 134*  K 3.8 3.2*  CL 101 105  CO2 23 20*  GLUCOSE 280* 206*  BUN 12 9  CREATININE 0.75 0.59  CALCIUM 8.9 7.2*  MG  --  1.2*    GFR: Estimated Creatinine Clearance: 114.6 mL/min (by C-G formula based on SCr of 0.59 mg/dL).  Liver Function Tests: Recent Labs  Lab 03/09/20 2102  AST 16  ALT 15  ALKPHOS 69  BILITOT 0.9  PROT 7.4  ALBUMIN 4.2    CBG: Recent Labs  Lab 03/10/20 2104 03/11/20 0015 03/11/20 0514 03/11/20 0809 03/11/20 1213  GLUCAP 181* 178* 155* 176* 178*     Recent Results (from the past 240 hour(s))  Culture, blood (routine x 2)     Status: None (Preliminary result)   Collection Time: 03/10/20 10:23 AM   Specimen: BLOOD LEFT HAND  Result Value Ref Range Status   Specimen Description   Final    BLOOD LEFT HAND Performed at [facility], [address] [place], [place] [address]    Special Requests   Final    BOTTLES DRAWN AEROBIC ONLY Blood Culture adequate volume Performed at [facility], [address] [place], [place] [address]    Culture   Final    NO GROWTH < 24 HOURS Performed at [facility], [address] [place], [place] [address]    Report Status PENDING  Incomplete  Culture, blood (routine x 2)     Status: None (Preliminary result)   Collection Time: 03/10/20 10:54 AM   Specimen: BLOOD RIGHT HAND  Result Value Ref Range Status   Specimen Description   Final    BLOOD RIGHT HAND Performed at [facility], [address] [place], [place] [address]    Special Requests   Final    BOTTLES DRAWN AEROBIC AND ANAEROBIC Blood Culture adequate volume Performed at [facility], [address] [place], [place] [address]    Culture   Final    NO GROWTH < 24  HOURS Performed at [facility], [address] [place], [place] [address]    Report Status PENDING  Incomplete         Radiology Studies: DG Abd 1 View  Result Date: 03/11/2020 CLINICAL DATA:  Abdominal pain and nausea EXAM: ABDOMEN - 1 VIEW COMPARISON:  None. FINDINGS: The bowel gas pattern is normal. No radio-opaque calculi or other significant radiographic abnormality are seen. IMPRESSION: Negative. Electronically Signed   By: Marlan Palau M.D.   On: 03/11/2020 08:34   CT Angio Chest PE W and/or Wo Contrast  Result Date: 03/10/2020 CLINICAL DATA:  Tachycardia and elevated D-dimer. EXAM: CT ANGIOGRAPHY CHEST WITH CONTRAST TECHNIQUE: Multidetector CT imaging of the chest was performed using the standard protocol during bolus administration of intravenous contrast. Multiplanar CT image reconstructions and MIPs were obtained to evaluate the vascular anatomy. CONTRAST:  2mL OMNIPAQUE  IOHEXOL 350 MG/ML SOLN COMPARISON:  None. FINDINGS: Cardiovascular: Satisfactory opacification of the pulmonary arteries to the segmental level. No evidence of pulmonary embolism. Normal heart size. No pericardial effusion. Mediastinum/Nodes: No enlarged mediastinal, hilar, or axillary lymph nodes. Thyroid gland, trachea, and esophagus demonstrate no significant findings. Lungs/Pleura: Lungs are clear. No pleural effusion or pneumothorax. Upper Abdomen: No acute abnormality. Musculoskeletal: No chest wall abnormality. No acute or significant osseous findings. Review of the MIP images confirms the above findings. IMPRESSION: No CT evidence of pulmonary embolism or active cardiopulmonary disease. Electronically Signed   By: Aram Candelahaddeus  Houston M.D.   On: 03/10/2020 02:45   CT Abdomen Pelvis W Contrast  Result Date: 03/09/2020 CLINICAL DATA:  Right lower quadrant abdominal pain chills EXAM: CT ABDOMEN AND PELVIS WITH CONTRAST TECHNIQUE: Multidetector CT imaging of the abdomen and pelvis was performed using the  standard protocol following bolus administration of intravenous contrast. CONTRAST:  100mL OMNIPAQUE IOHEXOL 300 MG/ML  SOLN COMPARISON:  None. FINDINGS: Lower chest: The visualized heart size within normal limits. No pericardial fluid/thickening. No hiatal hernia. The visualized portions of the lungs are clear. Hepatobiliary: The liver is normal in density without focal abnormality.The main portal vein is patent. No evidence of calcified gallstones, gallbladder wall thickening or biliary dilatation. Pancreas: Unremarkable. No pancreatic ductal dilatation or surrounding inflammatory changes. Spleen: Normal in size without focal abnormality. Adrenals/Urinary Tract: Both adrenal glands appear normal. The kidneys and collecting system appear normal without evidence of urinary tract calculus or hydronephrosis. Bladder is unremarkable. Stomach/Bowel: The stomach is normal in appearance. There are several loops of mildly dilated fluid-filled loops of jejunum measuring up to 3.2 cm. No wall thickening or focal area of narrowing is noted. The distal jejunal loops and ileal loops are decompressed. The colon is normal in appearance with a moderate amount of colonic stool. The appendix is grossly unremarkable. Vascular/Lymphatic: There are no enlarged mesenteric, retroperitoneal, or pelvic lymph nodes. No significant vascular findings are present. Reproductive: The uterus and adnexa are unremarkable. a small amount of physiologic free fluid in the deep cul-de-sac. Other: No evidence of abdominal wall mass or hernia. Musculoskeletal: No acute or significant osseous findings. IMPRESSION: Several mildly dilated fluid-filled loops of jejunum within the mid abdomen without a focal transition point or wall thickening. This is likely due to ileus. Normal appearing appendix. Electronically Signed   By: Jonna ClarkBindu  Avutu M.D.   On: 03/09/2020 23:50   DG Chest Port 1 View  Result Date: 03/10/2020 CLINICAL DATA:  Tachycardia and  generalized body aches. EXAM: PORTABLE CHEST 1 VIEW COMPARISON:  December 28, 2019 FINDINGS: The heart size and mediastinal contours are within normal limits. Both lungs are clear. The visualized skeletal structures are unremarkable. IMPRESSION: No active disease. Electronically Signed   By: Aram Candelahaddeus  Houston M.D.   On: 03/10/2020 00:18   ECHOCARDIOGRAM COMPLETE  Result Date: 03/10/2020    ECHOCARDIOGRAM REPORT   Patient Name:   Carlyle BasquesAEJA LAMBERT Date of Exam: 03/10/2020 Medical Rec #:  [id number]     Height:       64.0 in Accession #:    [id number]    Weight:       185.0 lb Date of Birth:  Aug 28, 1996     BSA:          1.893 m Patient Age:    23 years      BP:           115/71 mmHg Patient Gender: F  HR:           100 bpm. Exam Location:  Inpatient Procedure: 2D Echo Indications:    794.31 abnormal ECG  History:        Patient has no prior history of Echocardiogram examinations.                 Risk Factors:Diabetes.  Sonographer:    Celene Skeen RDCS (AE) Referring Phys: [id number] Gardiner Ramus Placida Cambre IMPRESSIONS  1. Left ventricular ejection fraction, by estimation, is 60 to 65%. The left ventricle has normal function. The left ventricle has no regional wall motion abnormalities. Indeterminate diastolic filling due to E-A fusion, but appears grossly normal.  2. Right ventricular systolic function is normal. The right ventricular size is normal. Tricuspid regurgitation signal is inadequate for assessing PA pressure.  3. The mitral valve is normal in structure. No evidence of mitral valve regurgitation. No evidence of mitral stenosis.  4. The aortic valve is normal in structure. Aortic valve regurgitation is not visualized. No aortic stenosis is present.  5. The inferior vena cava is normal in size with <50% respiratory variability, suggesting right atrial pressure of 8 mmHg. Conclusion(s)/Recommendation(s): Normal biventricular function without evidence of hemodynamically significant valvular heart disease.  FINDINGS  Left Ventricle: Left ventricular ejection fraction, by estimation, is 60 to 65%. The left ventricle has normal function. The left ventricle has no regional wall motion abnormalities. The left ventricular internal cavity size was normal in size. There is  no left ventricular hypertrophy. Indeterminate diastolic filling due to E-A fusion. Right Ventricle: The right ventricular size is normal. No increase in right ventricular wall thickness. Right ventricular systolic function is normal. Tricuspid regurgitation signal is inadequate for assessing PA pressure. Left Atrium: Left atrial size was normal in size. Right Atrium: Right atrial size was normal in size. Pericardium: There is no evidence of pericardial effusion. Mitral Valve: The mitral valve is normal in structure. Normal mobility of the mitral valve leaflets. No evidence of mitral valve regurgitation. No evidence of mitral valve stenosis. Tricuspid Valve: The tricuspid valve is normal in structure. Tricuspid valve regurgitation is not demonstrated. No evidence of tricuspid stenosis. Aortic Valve: The aortic valve is normal in structure. Aortic valve regurgitation is not visualized. No aortic stenosis is present. Pulmonic Valve: The pulmonic valve was normal in structure. Pulmonic valve regurgitation is not visualized. No evidence of pulmonic stenosis. Aorta: The aortic root is normal in size and structure. Venous: The inferior vena cava is normal in size with less than 50% respiratory variability, suggesting right atrial pressure of 8 mmHg. IAS/Shunts: The interatrial septum was not well visualized.  LEFT VENTRICLE PLAX 2D LVIDd:         4.80 cm  Diastology LVIDs:         2.50 cm  LV e' lateral: 14.80 cm/s LV PW:         0.90 cm LV IVS:        0.70 cm LVOT diam:     1.70 cm LV SV:         39 LV SV Index:   21 LVOT Area:     2.27 cm  RIGHT VENTRICLE RV S prime:     17.40 cm/s TAPSE (M-mode): 1.8 cm LEFT ATRIUM             Index       RIGHT ATRIUM            Index LA diam:  3.30 cm 1.74 cm/m  RA Area:     12.30 cm LA Vol (A2C):   29.9 ml 15.80 ml/m RA Volume:   25.90 ml  13.68 ml/m LA Vol (A4C):   28.8 ml 15.22 ml/m LA Biplane Vol: 30.4 ml 16.06 ml/m  AORTIC VALVE LVOT Vmax:   112.00 cm/s LVOT Vmean:  69.500 cm/s LVOT VTI:    0.174 m  AORTA Ao Root diam: 2.50 cm  SHUNTS Systemic VTI:  0.17 m Systemic Diam: 1.70 cm Weston Brass MD Electronically signed by Weston Brass MD Signature Date/Time: 03/10/2020/1:21:21 PM    Final         Scheduled Meds: . enoxaparin (LOVENOX) injection  40 mg Subcutaneous Q24H  . insulin aspart  0-9 Units Subcutaneous Q4H  . ondansetron (ZOFRAN) IV  4 mg Intravenous Q6H  . pantoprazole (PROTONIX) IV  40 mg Intravenous Q12H  . sodium chloride flush  3 mL Intravenous Q12H   Continuous Infusions: . sodium chloride 150 mL/hr at 03/11/20 1315  . famotidine (PEPCID) IV 20 mg (03/11/20 1111)     LOS: 1 day     Alwyn Ren, MD [facility]   To contact the attending provider between 7A-7P or the covering provider during after hours 7P-7A, please log into the web site [email or web address] and access using universal [place] password for that web site. If you do not have the password, please call the hospital operator.  03/11/2020, 1:20 PM

## 2020-03-11 NOTE — Progress Notes (Addendum)
Patient explained that abdominal pain is always there but eating increases it. When she eats she feels dizziness, light headed, nausea, increased abdominal pain. Endorses pain is worse in mid epigastric area and medial lower abdomen. Bowel sounds active/hyper active across all quadrants. RLQ tender upon palpation. Still awaiting stool sample, has not had bm today, though she had a couple loose stools yesterday. Flank pain comes and goes. Pt started menstrual cycle yesterday so RN asked if this could be contributing to her symptoms but she denies having any symptoms like this in the past.  Pt in tears stating she does not feel comfortable going home yet with this still going on and not knowing what's causing it. RN gave emotional support to pt and treated symptoms w/ prn medication. Will update provider.

## 2020-03-12 LAB — CBC
HCT: 35.2 % — ABNORMAL LOW (ref 36.0–46.0)
Hemoglobin: 11.4 g/dL — ABNORMAL LOW (ref 12.0–15.0)
MCH: 28.5 pg (ref 26.0–34.0)
MCHC: 32.4 g/dL (ref 30.0–36.0)
MCV: 88 fL (ref 80.0–100.0)
Platelets: 293 10*3/uL (ref 150–400)
RBC: 4 MIL/uL (ref 3.87–5.11)
RDW: 11.8 % (ref 11.5–15.5)
WBC: 3.5 10*3/uL — ABNORMAL LOW (ref 4.0–10.5)
nRBC: 0 % (ref 0.0–0.2)

## 2020-03-12 LAB — COMPREHENSIVE METABOLIC PANEL
ALT: 18 U/L (ref 0–44)
AST: 13 U/L — ABNORMAL LOW (ref 15–41)
Albumin: 3.3 g/dL — ABNORMAL LOW (ref 3.5–5.0)
Alkaline Phosphatase: 47 U/L (ref 38–126)
Anion gap: 8 (ref 5–15)
BUN: 6 mg/dL (ref 6–20)
CO2: 21 mmol/L — ABNORMAL LOW (ref 22–32)
Calcium: 8.1 mg/dL — ABNORMAL LOW (ref 8.9–10.3)
Chloride: 108 mmol/L (ref 98–111)
Creatinine, Ser: 0.47 mg/dL (ref 0.44–1.00)
GFR calc Af Amer: >60 mL/min (ref >60)
GFR calc non Af Amer: >60 mL/min (ref >60)
Glucose, Bld: 197 mg/dL — ABNORMAL HIGH (ref 70–99)
Potassium: 3.6 mmol/L (ref 3.5–5.1)
Sodium: 137 mmol/L (ref 135–145)
Total Bilirubin: 1.5 mg/dL — ABNORMAL HIGH (ref 0.3–1.2)
Total Protein: 6 g/dL — ABNORMAL LOW (ref 6.5–8.1)

## 2020-03-12 LAB — LIPASE, BLOOD: Lipase: 23 U/L (ref 11–51)

## 2020-03-12 LAB — GLUCOSE, CAPILLARY
Glucose-Capillary: 144 mg/dL — ABNORMAL HIGH (ref 70–99)
Glucose-Capillary: 165 mg/dL — ABNORMAL HIGH (ref 70–99)
Glucose-Capillary: 182 mg/dL — ABNORMAL HIGH (ref 70–99)

## 2020-03-12 LAB — PROCALCITONIN: Procalcitonin: <0.1 ng/mL

## 2020-03-12 MED ORDER — ONDANSETRON HCL 4 MG PO TABS: 4.0000 mg | ORAL_TABLET | Freq: Every day | ORAL | 1 refills | Status: DC | PRN

## 2020-03-12 MED ORDER — PANTOPRAZOLE SODIUM 40 MG PO TBEC
40.0000 mg | DELAYED_RELEASE_TABLET | Freq: Every day | ORAL | 1 refills | Status: DC
Start: 2020-03-12 — End: 2021-01-23

## 2020-03-12 NOTE — Progress Notes (Addendum)
Discharge instructions were reviewed. Patient denied questions and or concerns denied at this time. Work note provided. Pt A&Ox4, ambulatory without assistance. No change from am assessment. Pt encouraged to call for follow up visit.

## 2020-03-12 NOTE — Progress Notes (Signed)
[facility] Progress Note  Jill Montoya 24 y.o. 07/30/1996  CC:  Abdominal pain, nausea/vomiting  Subjective: Patient reports increased nausea after eating.  She ate pork roast last night and had nausea but no vomiting.  She ate eggs and yogurt this morning with mild nausea but no vomiting.  She has not had a bowel movement for two days.  Reports mild epigastric discomfort currently.    ROS : Review of Systems  Constitutional: Negative for chills and fever.  Cardiovascular: Negative for chest pain and palpitations.  Gastrointestinal: Positive for abdominal pain (mild, epigastric) and nausea. Negative for blood in stool, constipation, diarrhea, heartburn, melena and vomiting.   Objective: Vital signs in last 24 hours: Vitals:   03/11/20 2141 03/12/20 0555  BP: 108/61 (!) 107/55  Pulse: 66 77  Resp: 18 17  Temp: 98.9 F (37.2 C) 98.2 F (36.8 C)  SpO2: 94% 100%    Physical Exam: Physical Exam  Constitutional: She is oriented to person, place, and time. She appears well-developed and well-nourished. No distress.  Eyes: Conjunctivae and EOM are normal. No scleral icterus.  Cardiovascular: Normal rate, regular rhythm and normal heart sounds.  Pulmonary/Chest: No respiratory distress.  Abdominal: Soft. Bowel sounds are normal. She exhibits no distension and no mass. There is abdominal tenderness (mild, epigastric). There is no rebound and no guarding.  Neurological: She is alert and oriented to person, place, and time.  Skin: Skin is warm and dry.  Psychiatric: She has a normal mood and affect. Her behavior is normal.    Lab Results: Recent Labs    03/09/20 2102 03/10/20 0724  NA 135 134*  K 3.8 3.2*  CL 101 105  CO2 23 20*  GLUCOSE 280* 206*  BUN 12 9  CREATININE 0.75 0.59  CALCIUM 8.9 7.2*  MG  --  1.2*   Recent Labs    03/09/20 2102  AST 16  ALT 15  ALKPHOS 69  BILITOT 0.9  PROT 7.4  ALBUMIN 4.2   Recent Labs    03/10/20 0724 03/12/20 1029   WBC 3.4* 3.5*  HGB 12.2 11.4*  HCT 36.3 35.2*  MCV 86.6 88.0  PLT 275 293   No results for input(s): LABPROT, INR in the last 72 hours.   Impression/ Acute gastroenteritis, suspect infectious etiology. Nausea/vomiting improved. Patient tolerating a diet. Abdominal pain persists but is mild. WBCs decreased to 3.5.  Hgb stable 11.4.  No signs of GI bleeding. CMP pending.   Plan:  -GI pathogen panel pending -Continue Protonix BID, can transition to PO at time of discharge -Continue antiemetics PRN -Continue weaning pain meds -Advised patient to try bland diet for a couple days until symptoms resolve and to avoid acidic or heavy foods  Patient advised to follow up with [facility] or [facility] in 4-6 weeks.  OK to discharge from a GI standpoint.  [facility] will sign off.  Please contact us if we can be of any further assistance during his hospital stay.  Edrick Kins PA-C 03/12/2020, 10:38 AM  Contact #  [phone]

## 2020-03-12 NOTE — Discharge Summary (Signed)
Physician Discharge Summary  Jill Montoya DOB: Feb 05, 1996 DOA: 03/09/2020  PCP: Renford DillsPolite, Ronald, MD  Admit date: 03/09/2020 Discharge date: 03/12/2020  Admitted From: Disposition: Home Recommendations for Outpatient Follow-up:  1. Follow up with PCP in 1-2 weeks 2. Please obtain BMP/CBC in one week 3. Please follow up with [facility]  Home Health none Equipment/Devices none  Discharge Condition: Stable and improved CODE STATUS: Full code  diet recommendation: Cardiac Brief/Interim Summary:23 y.o.femalewith medical history significant forpoorly controlled insulin-dependent diabetes mellitus, COVID-19 infection in February 2021 from which she has since recovered, now presenting to the emergency department with 1 day of chills, aches, cramping abdominal discomfort, nausea, and nonbloody vomiting. Patient reports that her young daughter was experiencing nausea, vomiting, and diarrhea couple days ago, and then the patient herself developed the aforementioned symptoms yesterday. Abdominal discomfort is generalized, waxes and wanes, and cramping in nature. She denies diarrhea, melena, or hematochezia. She denies shortness of breath, cough, rhinorrhea, or sore throat.  ED Course:Upon arrival to the ED, patient is found to be febrile to 38.2C,tachycardic to the 130s, and with blood pressure 94/48. EKG features sinus tachycardia with rate 135 and PVC. Chest x-ray is negative for acute cardiopulmonary disease. Chemistry panel notable for glucose of 280. CBC unremarkable. Lactic acid reassuringly normal. Urinalysis with glucosuria and ketonuria. CTA chest is negative for PE or other acute cardiopulmonary disease. CT the abdomen pelvis demonstrates several mildly dilated fluid-filled loops of jejunum without focal transition point or wall thickening and thought to reflect ileus. Patient was given acetaminophen, Zofran, morphine, and 3 L normal saline in the ED.  Discharge  Diagnoses:  Principal Problem:   Acute gastroenteritis Active Problems:   Insulin-requiring or dependent type II diabetes mellitus (HCC) #1  Intractable nausea vomiting and diarrhea -her symptoms resolved with supportive treatment with IV fluids Zofran and Dilaudid.  She was also treated with Protonix IV.  She continued with some abdominal pain she was seen in consultation by GI who thought she probably had a viral gastroenteritis other differential diagnosis gastritis or peptic ulcer disease.  She is told to follow-up with [facility].  I am discharging her today on Protonix 40 mg daily with Zofran.  #2  Insulin-dependent diabetes-continue home meds   Discharge Instructions  Discharge Instructions    Diet - low sodium heart healthy   Complete by: As directed    Increase activity slowly   Complete by: As directed      Allergies as of 03/12/2020      Reactions   Nasonex [mometasone Furoate]    "My nasal passages close up"      Medication List    STOP taking these medications   ibuprofen 200 MG tablet Commonly known as: ADVIL     TAKE these medications   glucose blood test strip Commonly known as: ONE TOUCH ULTRA TEST USED TO CHECK BLOOD SUGARS FOUR TIMES DAILY.   insulin lispro 100 UNIT/ML KwikPen Commonly known as: HumaLOG KwikPen Inject 0.12 mLs (12 Units total) into the skin 3 (three) times daily with meals. And pen needles 3/day   ondansetron 4 MG tablet Commonly known as: Zofran Take 1 tablet (4 mg total) by mouth daily as needed for nausea or vomiting.   pantoprazole 40 MG tablet Commonly known as: Protonix Take 1 tablet (40 mg total) by mouth daily.      Follow-up Information    Renford DillsPolite, Ronald, MD Follow up.   Specialty: Internal Medicine Contact information: [address] [place] [address] [phone]          Allergies  Allergen Reactions  . Nasonex [Mometasone Furoate]     "My nasal passages close up"     Consultations:  gi   Procedures/Studies: DG Abd 1 View  Result Date: 03/11/2020 CLINICAL DATA:  Abdominal pain and nausea EXAM: ABDOMEN - 1 VIEW COMPARISON:  None. FINDINGS: The bowel gas pattern is normal. No radio-opaque calculi or other significant radiographic abnormality are seen. IMPRESSION: Negative. Electronically Signed   By: Marlan Palau M.D.   On: 03/11/2020 08:34   CT Angio Chest PE W and/or Wo Contrast  Result Date: 03/10/2020 CLINICAL DATA:  Tachycardia and elevated D-dimer. EXAM: CT ANGIOGRAPHY CHEST WITH CONTRAST TECHNIQUE: Multidetector CT imaging of the chest was performed using the standard protocol during bolus administration of intravenous contrast. Multiplanar CT image reconstructions and MIPs were obtained to evaluate the vascular anatomy. CONTRAST:  98mL OMNIPAQUE IOHEXOL 350 MG/ML SOLN COMPARISON:  None. FINDINGS: Cardiovascular: Satisfactory opacification of the pulmonary arteries to the segmental level. No evidence of pulmonary embolism. Normal heart size. No pericardial effusion. Mediastinum/Nodes: No enlarged mediastinal, hilar, or axillary lymph nodes. Thyroid gland, trachea, and esophagus demonstrate no significant findings. Lungs/Pleura: Lungs are clear. No pleural effusion or pneumothorax. Upper Abdomen: No acute abnormality. Musculoskeletal: No chest wall abnormality. No acute or significant osseous findings. Review of the MIP images confirms the above findings. IMPRESSION: No CT evidence of pulmonary embolism or active cardiopulmonary disease. Electronically Signed   By: Aram Candela M.D.   On: 03/10/2020 02:45   CT Abdomen Pelvis W Contrast  Result Date: 03/09/2020 CLINICAL DATA:  Right lower quadrant abdominal pain chills EXAM: CT ABDOMEN AND PELVIS WITH CONTRAST TECHNIQUE: Multidetector CT imaging of the abdomen and pelvis was performed using the standard protocol following bolus administration of intravenous contrast. CONTRAST:  OMNIPAQUE  IOHEXOL 300 MG/ML  SOLN COMPARISON:  None. FINDINGS: Lower chest: The visualized heart size within normal limits. No pericardial fluid/thickening. No hiatal hernia. The visualized portions of the lungs are clear. Hepatobiliary: The liver is normal in density without focal abnormality.The main portal vein is patent. No evidence of calcified gallstones, gallbladder wall thickening or biliary dilatation. Pancreas: Unremarkable. No pancreatic ductal dilatation or surrounding inflammatory changes. Spleen: Normal in size without focal abnormality. Adrenals/Urinary Tract: Both adrenal glands appear normal. The kidneys and collecting system appear normal without evidence of urinary tract calculus or hydronephrosis. Bladder is unremarkable. Stomach/Bowel: The stomach is normal in appearance. There are several loops of mildly dilated fluid-filled loops of jejunum measuring up to 3.2 cm. No wall thickening or focal area of narrowing is noted. The distal jejunal loops and ileal loops are decompressed. The colon is normal in appearance with a moderate amount of colonic stool. The appendix is grossly unremarkable. Vascular/Lymphatic: There are no enlarged mesenteric, retroperitoneal, or pelvic lymph nodes. No significant vascular findings are present. Reproductive: The uterus and adnexa are unremarkable. a small amount of physiologic free fluid in the deep cul-de-sac. Other: No evidence of abdominal wall mass or hernia. Musculoskeletal: No acute or significant osseous findings. IMPRESSION: Several mildly dilated fluid-filled loops of jejunum within the mid abdomen without a focal transition point or wall thickening. This is likely due to ileus. Normal appearing appendix. Electronically Signed   By: Jonna Clark M.D.   On: 03/09/2020 23:50   DG Chest Port 1 View  Result Date: 03/10/2020 CLINICAL DATA:  Tachycardia and generalized body aches. EXAM: PORTABLE CHEST 1 VIEW COMPARISON:  December 28, 2019 FINDINGS: The heart size  and mediastinal contours are within normal limits. Both lungs are clear. The visualized skeletal structures are unremarkable. IMPRESSION: No active disease. Electronically Signed   By: Aram Candela M.D.   On: 03/10/2020 00:18   ECHOCARDIOGRAM COMPLETE  Result Date: 03/10/2020    ECHOCARDIOGRAM REPORT   Patient Name:   Jill Montoya Date of Exam: 03/10/2020 Medical Rec #:  [id number]     Height:       64.0 in Accession #:    [id number]    Weight:       185.0 lb Date of Birth:  May 11, 1996     BSA:          1.893 m Patient Age:    23 years      BP:           115/71 mmHg Patient Gender: F             HR:           100 bpm. Exam Location:  Inpatient Procedure: 2D Echo Indications:    794.31 abnormal ECG  History:        Patient has no prior history of Echocardiogram examinations.                 Risk Factors:Diabetes.  Sonographer:    Celene Skeen RDCS (AE) Referring Phys: [id number] Gardiner Ramus Mally Gavina IMPRESSIONS  1. Left ventricular ejection fraction, by estimation, is 60 to 65%. The left ventricle has normal function. The left ventricle has no regional wall motion abnormalities. Indeterminate diastolic filling due to E-A fusion, but appears grossly normal.  2. Right ventricular systolic function is normal. The right ventricular size is normal. Tricuspid regurgitation signal is inadequate for assessing PA pressure.  3. The mitral valve is normal in structure. No evidence of mitral valve regurgitation. No evidence of mitral stenosis.  4. The aortic valve is normal in structure. Aortic valve regurgitation is not visualized. No aortic stenosis is present.  5. The inferior vena cava is normal in size with <50% respiratory variability, suggesting right atrial pressure of 8 mmHg. Conclusion(s)/Recommendation(s): Normal biventricular function without evidence of hemodynamically significant valvular heart disease. FINDINGS  Left Ventricle: Left ventricular ejection fraction, by estimation, is 60 to 65%. The left  ventricle has normal function. The left ventricle has no regional wall motion abnormalities. The left ventricular internal cavity size was normal in size. There is  no left ventricular hypertrophy. Indeterminate diastolic filling due to E-A fusion. Right Ventricle: The right ventricular size is normal. No increase in right ventricular wall thickness. Right ventricular systolic function is normal. Tricuspid regurgitation signal is inadequate for assessing PA pressure. Left Atrium: Left atrial size was normal in size. Right Atrium: Right atrial size was normal in size. Pericardium: There is no evidence of pericardial effusion. Mitral Valve: The mitral valve is normal in structure. Normal mobility of the mitral valve leaflets. No evidence of mitral valve regurgitation. No evidence of mitral valve stenosis. Tricuspid Valve: The tricuspid valve is normal in structure. Tricuspid valve regurgitation is not demonstrated. No evidence of tricuspid stenosis. Aortic Valve: The aortic valve is normal in structure. Aortic valve regurgitation is not visualized. No aortic stenosis is present. Pulmonic Valve: The pulmonic valve was normal in structure. Pulmonic valve regurgitation is not visualized. No evidence of pulmonic stenosis. Aorta: The aortic root is normal in size and structure. Venous: The inferior vena cava is normal in size with less than 50% respiratory variability,  suggesting right atrial pressure of 8 mmHg. IAS/Shunts: The interatrial septum was not well visualized.  LEFT VENTRICLE PLAX 2D LVIDd:         4.80 cm  Diastology LVIDs:         2.50 cm  LV e' lateral: 14.80 cm/s LV PW:         0.90 cm LV IVS:        0.70 cm LVOT diam:     1.70 cm LV SV:         39 LV SV Index:   21 LVOT Area:     2.27 cm  RIGHT VENTRICLE RV S prime:     17.40 cm/s TAPSE (M-mode): 1.8 cm LEFT ATRIUM             Index       RIGHT ATRIUM           Index LA diam:        3.30 cm 1.74 cm/m  RA Area:     12.30 cm LA Vol (A2C):   29.9 ml 15.80  ml/m RA Volume:   25.90 ml  13.68 ml/m LA Vol (A4C):   28.8 ml 15.22 ml/m LA Biplane Vol: 30.4 ml 16.06 ml/m  AORTIC VALVE LVOT Vmax:   112.00 cm/s LVOT Vmean:  69.500 cm/s LVOT VTI:    0.174 m  AORTA Ao Root diam: 2.50 cm  SHUNTS Systemic VTI:  0.17 m Systemic Diam: 1.70 cm Weston Brass MD Electronically signed by Weston Brass MD Signature Date/Time: 03/10/2020/1:21:21 PM    Final     (Echo, Carotid, EGD, Colonoscopy, ERCP)    Subjective: C/o abdominal pain though better   Discharge Exam: Vitals:   03/12/20 0555 03/12/20 1218  BP: (!) 107/55 105/66  Pulse: 77 67  Resp: 17   Temp: 98.2 F (36.8 C) 98.6 F (37 C)  SpO2: 100% 99%   Vitals:   03/11/20 1537 03/11/20 2141 03/12/20 0555 03/12/20 1218  BP: (!) 93/58 108/61 (!) 107/55 105/66  Pulse: 70 66 77 67  Resp:  18 17   Temp: 98.4 F (36.9 C) 98.9 F (37.2 C) 98.2 F (36.8 C) 98.6 F (37 C)  TempSrc: Oral Oral Oral Oral  SpO2: 98% 94% 100% 99%  Weight:      Height:        General: Pt is alert, awake, not in acute distress Cardiovascular: RRR, S1/S2 +, no rubs, no gallops Respiratory: CTA bilaterally, no wheezing, no rhonchi Abdominal: Soft, epigastric tenderness, ND, bowel sounds + Extremities: no edema, no cyanosis    The results of significant diagnostics from this hospitalization (including imaging, microbiology, ancillary and laboratory) are listed below for reference.     Microbiology: Recent Results (from the past 240 hour(s))  Culture, blood (routine x 2)     Status: None (Preliminary result)   Collection Time: 03/10/20 10:23 AM   Specimen: BLOOD LEFT HAND  Result Value Ref Range Status   Specimen Description   Final    BLOOD LEFT HAND Performed at [facility], [address] [place], [place] [address]    Special Requests   Final    BOTTLES DRAWN AEROBIC ONLY Blood Culture adequate volume Performed at [facility], [address], [place], [place]  [address]    Culture   Final    NO GROWTH 2 DAYS Performed at [facility], [address] [place], [place] [address]    Report Status PENDING  Incomplete  Culture, blood (routine x 2)     Status: None (Preliminary result)   Collection Time: 03/10/20 10:54 AM   Specimen: BLOOD RIGHT HAND  Result Value Ref Range Status   Specimen Description   Final    BLOOD RIGHT HAND Performed at [facility], [address], [place], [place] [address]    Special Requests   Final    BOTTLES DRAWN AEROBIC AND ANAEROBIC Blood Culture adequate volume Performed at [facility], [address], [place], [place] [address]    Culture   Final    NO GROWTH 2 DAYS Performed at [facility], [address], [place], [place] [address]    Report Status PENDING  Incomplete     Labs: BNP (last 3 results) No results for input(s): BNP in the last 8760 hours. Basic Metabolic Panel: Recent Labs  Lab 03/09/20 2102 03/10/20 0724 03/12/20 1029  NA 135 134* 137  K 3.8 3.2* 3.6  CL 101 105 108  CO2 23 20* 21*  GLUCOSE 280* 206* 197*  BUN 12 9 6   CREATININE 0.75 0.59 0.47  CALCIUM 8.9 7.2* 8.1*  MG  --  1.2*  --    Liver Function Tests: Recent Labs  Lab 03/09/20 2102 03/12/20 1029  AST 16 13*  ALT 15 18  ALKPHOS 69 47  BILITOT 0.9 1.5*  PROT 7.4 6.0*  ALBUMIN 4.2 3.3*   Recent Labs  Lab 03/09/20 2102 03/12/20 1029  LIPASE 19 23   No results for input(s): AMMONIA in the last 168 hours. CBC: Recent Labs  Lab 03/09/20 2102 03/10/20 0724 03/12/20 1029  WBC 8.5 3.4* 3.5*  HGB 15.5* 12.2 11.4*  HCT 46.0 36.3 35.2*  MCV 86.6 86.6 88.0  PLT 340 275 293   Cardiac Enzymes: No results for input(s): CKTOTAL, CKMB, CKMBINDEX, TROPONINI in the last 168 hours. BNP: Invalid input(s): POCBNP CBG: Recent Labs  Lab 03/11/20 2010 03/11/20 2356 03/12/20 0414 03/12/20 0759 03/12/20 1216  GLUCAP 171* 160* 144* 165* 182*   D-Dimer Recent Labs     03/09/20 2110  DDIMER 0.68*   Hgb A1c No results for input(s): HGBA1C in the last 72 hours. Lipid Profile No results for input(s): CHOL, HDL, LDLCALC, TRIG, CHOLHDL, LDLDIRECT in the last 72 hours. Thyroid function studies Recent Labs    03/10/20 0724  TSH 0.446   Anemia work up No results for input(s): VITAMINB12, FOLATE, FERRITIN, TIBC, IRON, RETICCTPCT in the last 72 hours. Urinalysis    Component Value Date/Time   COLORURINE YELLOW 03/09/2020 2103   APPEARANCEUR CLEAR 03/09/2020 2103   LABSPEC 1.028 03/09/2020 2103   PHURINE 5.0 03/09/2020 2103   GLUCOSEU >=500 (A) 03/09/2020 2103   HGBUR NEGATIVE 03/09/2020 2103   BILIRUBINUR NEGATIVE 03/09/2020 2103   KETONESUR 20 (A) 03/09/2020 2103   PROTEINUR NEGATIVE 03/09/2020 2103   NITRITE NEGATIVE 03/09/2020 2103   LEUKOCYTESUR NEGATIVE 03/09/2020 2103   Sepsis Labs Invalid input(s): PROCALCITONIN,  WBC,  LACTICIDVEN Microbiology Recent Results (from the past 240 hour(s))  Culture, blood (routine x 2)     Status: None (Preliminary result)   Collection Time: 03/10/20 10:23 AM   Specimen: BLOOD LEFT HAND  Result Value Ref Range Status   Specimen Description   Final    BLOOD LEFT HAND Performed at [facility], [address] [place], [place]    Special Requests   Final    BOTTLES DRAWN AEROBIC ONLY Blood Culture adequate  volume Performed at [facility], [place] [address], [place], [place] [address]    Culture   Final    NO GROWTH 2 DAYS Performed at [place] [address], [place], [place] [address]    Report Status PENDING  Incomplete  Culture, blood (routine x 2)     Status: None (Preliminary result)   Collection Time: 03/10/20 10:54 AM   Specimen: BLOOD RIGHT HAND  Result Value Ref Range Status   Specimen Description   Final    BLOOD RIGHT HAND Performed at [place] [address], [place], [place] [address]    Special  Requests   Final    BOTTLES DRAWN AEROBIC AND ANAEROBIC Blood Culture adequate volume Performed at [place] [address] [place], [place] [address]    Culture   Final    NO GROWTH 2 DAYS Performed at [place] [address] [place], [place] [address]    Report Status PENDING  Incomplete     Time coordinating discharge 37 minutes  SIGNED:   Georgette Shell, MD  [facility] 03/12/2020, 12:50 PM Pager   If 7PM-7AM, please contact night-coverage [email or web address] Password [email or web address]

## 2020-03-13 ENCOUNTER — Encounter: Payer: Self-pay | Admitting: *Deleted

## 2020-03-13 ENCOUNTER — Other Ambulatory Visit: Payer: Self-pay | Admitting: *Deleted

## 2020-03-13 NOTE — Patient Outreach (Addendum)
[facility]) Care Management  03/13/2020  Jill Montoya May 05, 1996 [id number]   Transition of care call   Referral received:03/12/20 Initial outreach:01/14/20 Insurance: [employer] U[employer]    Subjective: Initial successful telephone call to patient's preferred number in order to complete transition of care assessment; 2 HIPAA identifiers verified. Explained purpose of call and completed transition of care assessment.   Jill Montoya states that she is pretty much doing fine at this time, She reports tolerating soft diet, soups, and doing well. She denies nausea, reports have little pain. Begin to discuss blood sugars since discharge, called disconnected. Return call to patient unsuccessful mailbox is full unable to leave a message.    Objective:  Per electronic record Jill Montoya  was hospitalized at [facility]  From 4/24-4/27 for Acute Gastroenteritis   Comorbidities include: Diabetes type 2 , A1c on 02/02/20 was 10.8.  She was discharged to home on 4/27/21without the need for home health services or DME.   Assessment:  Patient voices good understanding of all discharge instructions.  See transition of care flowsheet for assessment details.   Plan:  Unable to complete initial outreach call , will plan return call in then next 4 business day to complete assessment.  Will route  unsuccessful outreach letter with [facility] Care Management pamphlet and 24 Hour Nurse Line Magnet to [employer] Care Management clinical pool to be mailed to patient's home address.    Egbert Garibaldi, RN, BSN  [facility] Care Management,Care Management Coordinator  [phone]- Mobile [phone]- Toll Free Main Office

## 2020-03-14 ENCOUNTER — Ambulatory Visit: Payer: 59 | Admitting: Endocrinology

## 2020-03-15 LAB — CULTURE, BLOOD (ROUTINE X 2)
Culture: NO GROWTH
Culture: NO GROWTH
Special Requests: ADEQUATE
Special Requests: ADEQUATE

## 2020-03-18 ENCOUNTER — Other Ambulatory Visit: Payer: Self-pay | Admitting: *Deleted

## 2020-03-18 NOTE — Patient Outreach (Signed)
[facility]) Care Management  03/18/2020  Jill Montoya January 04, 1996 [id number]    Transition of care call Referral received: 03/12/20 Initial outreach attempt: 03/13/20 Insurance: UMR    2nd telephone call to patient's preferred contact number in order to complete initial  hospital discharge transition of care assessment , initial call disconnected before completion of assessment . Returned call to patient heard saying  I wish they would leave me alone", call ended.     Objective: Per electronic record Jill Montoya was hospitalized [facility]  From 4/24-4/27 for Acute Gastroenteritis   Comorbidities include: Diabetes type 2 , A1c on 02/02/20 was 10.8.  She was discharged to home on 4/27/21without the need for home health servicesor DME.   Plan Will close case to [facility] care management as patient declining further follow up.    Jill Garibaldi, RN, BSN  [facility] Care Management,Care Management Coordinator  [phone]- Mobile [phone]- Toll Free Main Office

## 2020-03-25 DIAGNOSIS — Z794 Long term (current) use of insulin: Secondary | ICD-10-CM | POA: Diagnosis not present

## 2020-03-25 DIAGNOSIS — E1165 Type 2 diabetes mellitus with hyperglycemia: Secondary | ICD-10-CM | POA: Diagnosis not present

## 2020-03-25 DIAGNOSIS — K529 Noninfective gastroenteritis and colitis, unspecified: Secondary | ICD-10-CM | POA: Diagnosis not present

## 2020-04-06 ENCOUNTER — Ambulatory Visit: Payer: 59 | Attending: Internal Medicine

## 2020-04-06 DIAGNOSIS — Z23 Encounter for immunization: Secondary | ICD-10-CM

## 2020-04-06 NOTE — Progress Notes (Signed)
   Covid-19 Vaccination Clinic  Name:  Jill Montoya    MRN: [id number] DOB: [birth year]  04/06/2020  [name] was observed post Covid-19 immunization for 15 minutes without incident. She was provided with Vaccine Information Sheet and instruction to access the V-Safe system.   [name] was instructed to call 911 with any severe reactions post vaccine: [name] Kitchen Difficulty breathing  . Swelling of face and throat  . A fast heartbeat  . A bad rash all over body  . Dizziness and weakness   Immunizations Administered    Name Date Dose VIS Date Route   Pfizer COVID-19 Vaccine 04/06/2020 10:16 AM 0.3 mL 01/10/2019 Intramuscular   Manufacturer: [employer], [employer]   Lot: [id number]   NDC: [address]-0

## 2020-04-27 ENCOUNTER — Other Ambulatory Visit: Payer: Self-pay | Admitting: Endocrinology

## 2020-04-27 NOTE — Telephone Encounter (Signed)
Please forward refill request to pt's primary care provider. Also, f/u is due

## 2020-04-29 ENCOUNTER — Ambulatory Visit: Payer: 59 | Attending: Internal Medicine

## 2020-04-29 DIAGNOSIS — Z23 Encounter for immunization: Secondary | ICD-10-CM

## 2020-04-29 NOTE — Progress Notes (Signed)
   Covid-19 Vaccination Clinic  Name:  Zareah Hunzeker    MRN: [id number] DOB: [birth year]  04/29/2020  Ms. Mcdade was observed post Covid-19 immunization for 15 minutes without incident. She was provided with Vaccine Information Sheet and instruction to access the V-Safe system.   [name] was instructed to call 911 with any severe reactions post vaccine: [name] Kitchen Difficulty breathing  . Swelling of face and throat  . A fast heartbeat  . A bad rash all over body  . Dizziness and weakness   Immunizations Administered    Name Date Dose VIS Date Route   Pfizer COVID-19 Vaccine 04/29/2020  4:13 PM 0.3 mL 01/10/2019 Intramuscular   Manufacturer: [employer], [employer]   Lot: [id number]   NDC: [address]-7

## 2020-08-14 DIAGNOSIS — Z3201 Encounter for pregnancy test, result positive: Secondary | ICD-10-CM | POA: Diagnosis not present

## 2020-08-14 DIAGNOSIS — Z3480 Encounter for supervision of other normal pregnancy, unspecified trimester: Secondary | ICD-10-CM | POA: Diagnosis not present

## 2020-08-28 ENCOUNTER — Other Ambulatory Visit: Payer: Self-pay | Admitting: Endocrinology

## 2020-08-28 MED ORDER — INSULIN LISPRO (1 UNIT DIAL) 100 UNIT/ML (KWIKPEN): 12.0000 [IU] | PEN_INJECTOR | Freq: Three times a day (TID) | SUBCUTANEOUS | 1 refills | Status: DC

## 2020-08-28 NOTE — Telephone Encounter (Signed)
Humalog refill sent.

## 2020-08-28 NOTE — Telephone Encounter (Signed)
Medication Refill Request  Did you call your pharmacy and request this refill first?No . If patient has not contacted pharmacy first, instruct them to do so for future refills.  . Remind them that contacting the pharmacy for their refill is the quickest method to get the refill.  . Refill policy also stated that it will take anywhere between 24-72 hours to receive the refill.    Name of medication?  insulin lispro (HUMALOG KWIKPEN) 100 UNIT/ML KwikPen(Expired)  Is this a 90 day supply? Yes  Name and location of pharmacy? [id number] [place], [place] - [address] AT [facility] [address] Phone:  [phone]  Fax:  [phone]

## 2020-08-29 ENCOUNTER — Other Ambulatory Visit: Payer: Self-pay | Admitting: Obstetrics and Gynecology

## 2020-08-29 ENCOUNTER — Encounter (HOSPITAL_COMMUNITY): Payer: Self-pay | Admitting: Obstetrics and Gynecology

## 2020-08-29 ENCOUNTER — Inpatient Hospital Stay (HOSPITAL_COMMUNITY)
Admission: AD | Admit: 2020-08-29 | Discharge: 2020-08-29 | Disposition: A | Discharge status: Home / Self Care | Payer: 59 | Attending: Obstetrics and Gynecology | Admitting: Obstetrics and Gynecology

## 2020-08-29 ENCOUNTER — Inpatient Hospital Stay (HOSPITAL_COMMUNITY): Payer: 59

## 2020-08-29 ENCOUNTER — Other Ambulatory Visit: Payer: Self-pay

## 2020-08-29 DIAGNOSIS — Z3A09 9 weeks gestation of pregnancy: Secondary | ICD-10-CM

## 2020-08-29 DIAGNOSIS — O4690 Antepartum hemorrhage, unspecified, unspecified trimester: Secondary | ICD-10-CM

## 2020-08-29 DIAGNOSIS — O021 Missed abortion: Secondary | ICD-10-CM | POA: Insufficient documentation

## 2020-08-29 DIAGNOSIS — N939 Abnormal uterine and vaginal bleeding, unspecified: Secondary | ICD-10-CM | POA: Diagnosis not present

## 2020-08-29 DIAGNOSIS — O209 Hemorrhage in early pregnancy, unspecified: Secondary | ICD-10-CM | POA: Diagnosis not present

## 2020-08-29 DIAGNOSIS — Z6790 Unspecified blood type, Rh positive: Secondary | ICD-10-CM | POA: Insufficient documentation

## 2020-08-29 LAB — URINALYSIS, ROUTINE W REFLEX MICROSCOPIC
Bilirubin Urine: NEGATIVE
Glucose, UA: >=500 mg/dL — AB
Ketones, ur: NEGATIVE mg/dL
Leukocytes,Ua: NEGATIVE
Nitrite: NEGATIVE
Protein, ur: NEGATIVE mg/dL
Specific Gravity, Urine: 1.039 — ABNORMAL HIGH (ref 1.005–1.030)
pH: 6 (ref 5.0–8.0)

## 2020-08-29 LAB — CBC
HCT: 38.5 % (ref 36.0–46.0)
Hemoglobin: 12.8 g/dL (ref 12.0–15.0)
MCH: 28.5 pg (ref 26.0–34.0)
MCHC: 33.2 g/dL (ref 30.0–36.0)
MCV: 85.7 fL (ref 80.0–100.0)
Platelets: 386 10*3/uL (ref 150–400)
RBC: 4.49 MIL/uL (ref 3.87–5.11)
RDW: 11.9 % (ref 11.5–15.5)
WBC: 6.5 10*3/uL (ref 4.0–10.5)
nRBC: 0 % (ref 0.0–0.2)

## 2020-08-29 LAB — HCG, QUANTITATIVE, PREGNANCY: hCG, Beta Chain, Quant, S: 10466 m[IU]/mL — ABNORMAL HIGH (ref <5)

## 2020-08-29 LAB — WET PREP, GENITAL
Clue Cells Wet Prep HPF POC: NONE SEEN
Sperm: NONE SEEN
Trich, Wet Prep: NONE SEEN
Yeast Wet Prep HPF POC: NONE SEEN

## 2020-08-29 LAB — POCT PREGNANCY, URINE: Preg Test, Ur: POSITIVE — AB

## 2020-08-29 NOTE — MAU Provider Note (Signed)
History     CSN: [id number]  Arrival date and time: 08/29/20 1019   First Provider Initiated Contact with Patient 08/29/20 1141      Chief Complaint  Patient presents with  . Vaginal Bleeding   24 y.o. [id number] @[redacted]w[redacted]d  by LMP presenting with VB. Reports onset of brown spotting yesterday. Today the bleeding became heavier and dark red. Not heavy enough for a pad or liner. Had IC yesterday am. Denies abd pain but feels pressure in her lower abdomen. Hx of T2DM on Insulin.  OB History    Gravida  2   Para  1   Term      Preterm  1   AB      Living  1     SAB      TAB      Ectopic      Multiple      Live Births              Past Medical History:  Diagnosis Date  . Chlamydia   . Diabetes mellitus without complication [facility])     Past Surgical History:  Procedure Laterality Date  . NO PAST SURGERIES      Family History  Problem Relation Age of Onset  . Diabetes Mother   . Diabetes Father   . Aneurysm Paternal Grandmother     Social History   Tobacco Use  . Smoking status: Never Smoker  . Smokeless tobacco: Never Used  Vaping Use  . Vaping Use: Never used  Substance Use Topics  . Alcohol use: No  . Drug use: Never    Allergies:  Allergies  Allergen Reactions  . Nasonex [Mometasone Furoate]     "My nasal passages close up"    No medications prior to admission.    Review of Systems  Gastrointestinal: Negative for abdominal pain.  Genitourinary: Positive for vaginal bleeding.   Physical Exam   Blood pressure 108/76, pulse 93, temperature 99 F (37.2 C), resp. rate 18, height 5\' 4"  (1.626 m), weight 84.4 kg, last menstrual period 06/25/2020, unknown if currently breastfeeding.  Physical Exam Vitals and nursing note reviewed. Exam conducted with a chaperone present.  Constitutional:      General: She is not in acute distress.    Appearance: Normal appearance.  HENT:     Head: Normocephalic and atraumatic.  Cardiovascular:     Rate  and Rhythm: Normal rate.  Pulmonary:     Effort: Pulmonary effort is normal. No respiratory distress.  Abdominal:     General: There is no distension.     Palpations: Abdomen is soft.     Tenderness: There is no abdominal tenderness.  Genitourinary:    Comments: External: no lesions or erythema Vagina: rugated, pink, moist, scant dark bloody discharge Uterus: + enlarged, anteverted, non tender, no CMT Adnexae: no masses, no tenderness left, no tenderness right Cervix closed  Musculoskeletal:        General: Normal range of motion.     Cervical back: Normal range of motion.  Skin:    General: Skin is warm and dry.  Neurological:     General: No focal deficit present.     Mental Status: She is alert and oriented to person, place, and time.  Psychiatric:        Mood and Affect: Mood normal.        Behavior: Behavior normal.    Results for orders placed or performed during the hospital encounter of 08/29/20 (from the  past 24 hour(s))  Pregnancy, urine POC     Status: Abnormal   Collection Time: 08/29/20 11:12 AM  Result Value Ref Range   Preg Test, Ur POSITIVE (A) NEGATIVE  Urinalysis, Routine w reflex microscopic Urine, Clean Catch     Status: Abnormal   Collection Time: 08/29/20 11:15 AM  Result Value Ref Range   Color, Urine YELLOW YELLOW   APPearance CLEAR CLEAR   Specific Gravity, Urine 1.039 (H) 1.005 - 1.030   pH 6.0 5.0 - 8.0   Glucose, UA >=500 (A) NEGATIVE mg/dL   Hgb urine dipstick MODERATE (A) NEGATIVE   Bilirubin Urine NEGATIVE NEGATIVE   Ketones, ur NEGATIVE NEGATIVE mg/dL   Protein, ur NEGATIVE NEGATIVE mg/dL   Nitrite NEGATIVE NEGATIVE   Leukocytes,Ua NEGATIVE NEGATIVE   RBC / HPF 0-5 0 - 5 RBC/hpf   WBC, UA 6-10 0 - 5 WBC/hpf   Bacteria, UA RARE (A) NONE SEEN   Squamous Epithelial / LPF 0-5 0 - 5  hCG, quantitative, pregnancy     Status: Abnormal   Collection Time: 08/29/20 11:42 AM  Result Value Ref Range   hCG, Beta Chain, Quant, S 10,466 (H) <5  mIU/mL  CBC     Status: None   Collection Time: 08/29/20 11:42 AM  Result Value Ref Range   WBC 6.5 4.0 - 10.5 K/uL   RBC 4.49 3.87 - 5.11 MIL/uL   Hemoglobin 12.8 12.0 - 15.0 g/dL   HCT 09.3 36 - 46 %   MCV 85.7 80.0 - 100.0 fL   MCH 28.5 26.0 - 34.0 pg   MCHC 33.2 30.0 - 36.0 g/dL   RDW 26.7 12.4 - 58.0 %   Platelets 386 150 - 400 K/uL   nRBC 0.0 0.0 - 0.2 %  Wet prep, genital     Status: Abnormal   Collection Time: 08/29/20 11:50 AM   Specimen: Cervix  Result Value Ref Range   Yeast Wet Prep HPF POC NONE SEEN NONE SEEN   Trich, Wet Prep NONE SEEN NONE SEEN   Clue Cells Wet Prep HPF POC NONE SEEN NONE SEEN   WBC, Wet Prep HPF POC MANY (A) NONE SEEN   Sperm NONE SEEN    US OB LESS THAN 14 WEEKS WITH OB TRANSVAGINAL  Result Date: 08/29/2020 CLINICAL DATA:  Vaginal bleeding. Estimated gestational age of [redacted] weeks, 2 days by LMP. EXAM: OBSTETRIC <14 WK [place] AND TRANSVAGINAL OB US TECHNIQUE: Both transabdominal and transvaginal ultrasound examinations were performed for complete evaluation of the gestation as well as the maternal uterus, adnexal regions, and pelvic cul-de-sac. Transvaginal technique was performed to assess early pregnancy. COMPARISON:  CT abdomen pelvis dated March 09, 2020. FINDINGS: Intrauterine gestational sac: Single Yolk sac:  Visualized. Embryo:  Visualized. Cardiac Activity: Not Visualized. CRL:  7.8 mm   6 w   4 d                  [place] EDC: 04/20/2021 Subchorionic hemorrhage:  None visualized. Maternal uterus/adnexae: Unremarkable. IMPRESSION: 1. Findings meet definitive criteria for failed pregnancy. This follows SRU consensus guidelines: Diagnostic Criteria for Nonviable Pregnancy Early in the First Trimester. [facility] [phone]. Electronically Signed   By: Obie Dredge M.D.   On: 08/29/2020 12:34   MAU Course  Procedures  MDM Labs and [place] ordered and reviewed. MAB confirmed by [place]. Informed pt. Condolences given. Recommend f/u with primary OB provider  to discuss options. Stable for discharge home.   Assessment  and Plan   1. Missed abortion   2. Vaginal bleeding in pregnancy   3. Blood type, Rh positive    Discharge home [facility] within 1 week Bleeding/return precautions  Allergies as of 08/29/2020      Reactions   Nasonex [mometasone Furoate]    "My nasal passages close up"      Medication List    TAKE these medications   glucose blood test strip Commonly known as: ONE TOUCH ULTRA TEST USED TO CHECK BLOOD SUGARS FOUR TIMES DAILY.   insulin lispro 100 UNIT/ML KwikPen Commonly known as: HumaLOG KwikPen Inject 12 Units into the skin 3 (three) times daily with meals.   ondansetron 4 MG tablet Commonly known as: Zofran Take 1 tablet (4 mg total) by mouth daily as needed for nausea or vomiting.   pantoprazole 40 MG tablet Commonly known as: Protonix Take 1 tablet (40 mg total) by mouth daily.      Donette Larry, CNM 08/29/2020, 2:23 PM

## 2020-08-29 NOTE — Discharge Instructions (Signed)
Managing Pregnancy Loss Pregnancy loss can happen any time during a pregnancy. Often the cause is not known. It is rarely because of anything you did. Pregnancy loss in early pregnancy (during the first trimester) is called a miscarriage. This type of pregnancy loss is the most common. Pregnancy loss that happens after 20 weeks of pregnancy is called fetal demise if the baby's heart stops beating before birth. Fetal demise is much less common. Some women experience spontaneous labor shortly after fetal demise resulting in a stillborn birth (stillbirth). Any pregnancy loss can be devastating. You will need to recover both physically and emotionally. Most women are able to get pregnant again after a pregnancy loss and deliver a healthy baby. How to manage emotional recovery  Pregnancy loss is very hard emotionally. You may feel many different emotions while you grieve. You may feel sad and angry. You may also feel guilty. It is normal to have periods of crying. Emotional recovery can take longer than physical recovery. It is different for everyone. Taking these steps can help you in managing this loss:  Remember that it is unlikely you did anything to cause the pregnancy loss.  Share your thoughts and feelings with friends, family, and your partner. Remember that your partner is also recovering emotionally.  Make sure you have a good support system. Do not spend too much time alone.  Meet with a pregnancy loss counselor or join a pregnancy loss support group.  Get enough sleep and eat a healthy diet. Return to regular exercise when you have recovered physically.  Do not use drugs or alcohol to manage your emotions.  Consider seeing a mental health professional to help you recover emotionally.  Ask a friend or loved one to help you decide what to do with any clothing and nursery items you received for your baby. In the case of a stillbirth, many women benefit from taking additional steps in the  grieving process. You may want to:  Hold your baby after the birth.  Name your baby.  Request a birth certificate.  Create a keepsake such as handprints or footprints.  Dress your baby and have a picture taken.  Make funeral arrangements.  Ask for a baptism or blessing. Hospitals have staff members who can help you with all these arrangements. How to recognize emotional stress It is normal to have emotional stress after a pregnancy loss. But emotional stress that lasts a long time or becomes severe requires treatment. Watch out for these signs of severe emotional stress:  Sadness, anger, or guilt that is not going away and is interfering with your normal activities.  Relationship problems that have occurred or gotten worse since the pregnancy loss.  Signs of depression that last longer than 2 weeks. These may include: ? Sadness. ? Anxiety. ? Hopelessness. ? Loss of interest in activities you enjoy. ? Inability to concentrate. ? Trouble sleeping or sleeping too much. ? Loss of appetite or overeating. ? Thoughts of death or of hurting yourself. Follow these instructions at home:  Take over-the-counter and prescription medicines only as told by your health care provider.  Rest at home until your energy level returns. Return to your normal activities as told by your health care provider. Ask your health care provider what activities are safe for you.  When you are ready, meet with your health care provider to discuss steps to take for a future pregnancy.  Keep all follow-up visits as told by your health care provider. This is important.   Where to find support  To help you and your partner with the process of grieving, talk with your health care provider or seek counseling.  Consider meeting with others who have experienced pregnancy loss. Ask your health care provider about support groups and resources. Where to find more information  [place] Department of Health and Human  Services Office on Women's Health: www.womenshealth.gov  American Pregnancy Association: www.americanpregnancy.org Contact a health care provider if:  You continue to experience grief, sadness, or lack of motivation for everyday activities, and those feelings do not improve over time.  You are struggling to recover emotionally, especially if you are using alcohol or substances to help. Get help right away if:  You have thoughts of hurting yourself or others. If you ever feel like you may hurt yourself or others, or have thoughts about taking your own life, get help right away. You can go to your nearest emergency department or call:  Your local emergency services (911 in the [place]  A suicide crisis helpline, such as the National Suicide Prevention Lifeline at 1-800-273-8255. This is open 24 hours a day. Summary  Any pregnancy loss can be difficult physically and emotionally.  You may experience many different emotions while you grieve. Emotional recovery can last longer than physical recovery.  It is normal to have emotional stress after a pregnancy loss. But emotional stress that lasts a long time or becomes severe requires treatment.  See your health care provider if you are struggling emotionally after a pregnancy loss. This information is not intended to replace advice given to you by your health care provider. Make sure you discuss any questions you have with your health care provider. Document Revised: 02/22/2019 Document Reviewed: 01/13/2018 Elsevier Patient Education  2020 Elsevier Inc. Miscarriage A miscarriage is the loss of an unborn baby (fetus) before the 20th week of pregnancy. Follow these instructions at home: Medicines   Take over-the-counter and prescription medicines only as told by your doctor.  If you were prescribed antibiotic medicine, take it as told by your doctor. Do not stop taking the antibiotic even if you start to feel better.  Do not take NSAIDs  unless your doctor says that this is safe for you. NSAIDs include aspirin and ibuprofen. These medicines can cause bleeding. Activity  Rest as directed. Ask your doctor what activities are safe for you.  Have someone help you at home during this time. General instructions  Write down how many pads you use each day and how soaked they are.  Watch the amount of tissue or clumps of blood (blood clots) that you pass from your vagina. Save any large amounts of tissue for your doctor.  Do not use tampons, douche, or have sex until your doctor approves.  To help you and your partner with the process of grieving, talk with your doctor or seek counseling.  When you are ready, meet with your doctor to talk about steps you should take for your health. Also, talk with your doctor about steps to take to have a healthy pregnancy in the future.  Keep all follow-up visits as told by your doctor. This is important. Contact a doctor if:  You have a fever or chills.  You have vaginal discharge that smells bad.  You have more bleeding. Get help right away if:  You have very bad cramps or pain in your back or belly.  You pass clumps of blood that are walnut-sized or larger from your vagina.  You pass tissue   that is walnut-sized or larger from your vagina.  You soak more than 1 regular pad in an hour.  You get light-headed or weak.  You faint (pass out).  You have feelings of sadness that do not go away, or you have thoughts of hurting yourself. Summary  A miscarriage is the loss of an unborn baby before the 20th week of pregnancy.  Follow your doctor's instructions for home care. Keep all follow-up appointments.  To help you and your partner with the process of grieving, talk with your doctor or seek counseling. This information is not intended to replace advice given to you by your health care provider. Make sure you discuss any questions you have with your health care provider. Document  Revised: 02/24/2019 Document Reviewed: 12/08/2016 Elsevier Patient Education  2020 Elsevier Inc.  

## 2020-08-29 NOTE — MAU Note (Signed)
Pt stated she had some brown to light pinks spotting yesterday afternoon, Had intercourse in the morning. No pain or cramping. Today at work bleeding got heavier with some pelvic pressure.

## 2020-08-30 DIAGNOSIS — O021 Missed abortion: Secondary | ICD-10-CM | POA: Diagnosis not present

## 2020-08-30 LAB — GC/CHLAMYDIA PROBE AMP (~~LOC~~) NOT AT ARMC
Chlamydia: NEGATIVE
Comment: NEGATIVE
Comment: NORMAL
Neisseria Gonorrhea: NEGATIVE

## 2020-09-12 DIAGNOSIS — O039 Complete or unspecified spontaneous abortion without complication: Secondary | ICD-10-CM | POA: Diagnosis not present

## 2020-09-12 DIAGNOSIS — N9089 Other specified noninflammatory disorders of vulva and perineum: Secondary | ICD-10-CM | POA: Diagnosis not present

## 2020-09-19 DIAGNOSIS — O039 Complete or unspecified spontaneous abortion without complication: Secondary | ICD-10-CM | POA: Diagnosis not present

## 2020-10-01 DIAGNOSIS — Z3201 Encounter for pregnancy test, result positive: Secondary | ICD-10-CM | POA: Diagnosis not present

## 2020-10-01 DIAGNOSIS — O039 Complete or unspecified spontaneous abortion without complication: Secondary | ICD-10-CM | POA: Diagnosis not present

## 2020-10-08 DIAGNOSIS — O039 Complete or unspecified spontaneous abortion without complication: Secondary | ICD-10-CM | POA: Diagnosis not present

## 2020-10-16 DIAGNOSIS — Z419 Encounter for procedure for purposes other than remedying health state, unspecified: Secondary | ICD-10-CM | POA: Diagnosis not present

## 2020-10-28 DIAGNOSIS — O039 Complete or unspecified spontaneous abortion without complication: Secondary | ICD-10-CM | POA: Diagnosis not present

## 2020-11-04 DIAGNOSIS — Z3202 Encounter for pregnancy test, result negative: Secondary | ICD-10-CM | POA: Diagnosis not present

## 2020-11-16 DIAGNOSIS — Z419 Encounter for procedure for purposes other than remedying health state, unspecified: Secondary | ICD-10-CM | POA: Diagnosis not present

## 2020-12-17 DIAGNOSIS — Z419 Encounter for procedure for purposes other than remedying health state, unspecified: Secondary | ICD-10-CM | POA: Diagnosis not present

## 2021-01-14 DIAGNOSIS — Z419 Encounter for procedure for purposes other than remedying health state, unspecified: Secondary | ICD-10-CM | POA: Diagnosis not present

## 2021-01-23 ENCOUNTER — Ambulatory Visit (INDEPENDENT_AMBULATORY_CARE_PROVIDER_SITE_OTHER): Payer: PRIVATE HEALTH INSURANCE | Admitting: Endocrinology

## 2021-01-23 ENCOUNTER — Telehealth: Payer: Self-pay | Admitting: Endocrinology

## 2021-01-23 ENCOUNTER — Other Ambulatory Visit: Payer: Self-pay

## 2021-01-23 VITALS — BP 110/80 | HR 88 | Ht 64.0 in | Wt 195.6 lb

## 2021-01-23 DIAGNOSIS — O24819 Other pre-existing diabetes mellitus in pregnancy, unspecified trimester: Secondary | ICD-10-CM | POA: Diagnosis not present

## 2021-01-23 LAB — POCT GLYCOSYLATED HEMOGLOBIN (HGB A1C): Hemoglobin A1C: 11.4 % — AB (ref 4.0–5.6)

## 2021-01-23 MED ORDER — FREESTYLE LIBRE 2 READER DEVI: 1.0000 | Freq: Once | 1 refills | Status: AC

## 2021-01-23 MED ORDER — INSULIN LISPRO (1 UNIT DIAL) 100 UNIT/ML (KWIKPEN): 18.0000 [IU] | PEN_INJECTOR | Freq: Three times a day (TID) | SUBCUTANEOUS | 3 refills | Status: DC

## 2021-01-23 MED ORDER — FREESTYLE LIBRE 2 SENSOR MISC: 1.0000 | 3 refills | Status: DC

## 2021-01-23 NOTE — Patient Instructions (Addendum)
I have sent a prescription to your pharmacy, to increase the humalog to 12 units 3 times a day (just before each meal).  check your blood sugar 4 times a day: before the 3 meals, and at bedtime.  also check if you have symptoms of your blood sugar being too high or too low.  please keep a record of the readings and bring it to your next appointment here (or you can bring the meter itself).  You can write it on any piece of paper.  please call us sooner if your blood sugar goes below 70, or if you have a lot of readings over 200.   In view of your medical condition, you should avoid pregnancy until we have decided it is safe.  I have sent a prescription to your pharmacy, for the continuous glucose monitor.    Please come back for a follow-up appointment in 2 months.

## 2021-01-23 NOTE — Telephone Encounter (Signed)
Pt's insurance doesn't cover the [facility] and is asking for a Dexcom to be sent instead  [id number] - [place], [place] - [address] AT [facility] [address] Phone:  [phone]  Fax:  [phone]

## 2021-01-23 NOTE — Progress Notes (Signed)
Subjective:    Patient ID: Jill Montoya, female    DOB: [birth year], 25 y.o.   MRN: [id number]  HPI Pt returns for f/u of diabetes mellitus: DM type: Insulin-requiring type 2 Dx'ed: 2015 Complications: none Therapy: insulin since 2019 pregnancy GDM: never DKA: never Severe hypoglycemia: never Pancreatitis: never Pancreatic imaging: never Other: she takes multiple daily injections; She did not tolerate Trulicity (burning at inject site). Interval history: she takes humalog, 12 units, 3 times a day (just before each meal).  no cbg record, but states cbg's vary from 100-326.  pt states she feels well in general.  She is considering another pregnancy.  Pt says she never misses the insulin.   Past Medical History:  Diagnosis Date  . Chlamydia   . Diabetes mellitus without complication [facility])     Past Surgical History:  Procedure Laterality Date  . NO PAST SURGERIES      Social History   Socioeconomic History  . Marital status: Married    Spouse name: Not on file  . Number of children: Not on file  . Years of education: Not on file  . Highest education level: Not on file  Occupational History  . Not on file  Tobacco Use  . Smoking status: Never Smoker  . Smokeless tobacco: Never Used  Vaping Use  . Vaping Use: Never used  Substance and Sexual Activity  . Alcohol use: No  . Drug use: Never  . Sexual activity: Yes    Birth control/protection: None  Other Topics Concern  . Not on file  Social History Narrative  . Not on file   Social Determinants of Health   Financial Resource Strain: Not on file  Food Insecurity: Not on file  Transportation Needs: Not on file  Physical Activity: Not on file  Stress: Not on file  Social Connections: Not on file  Intimate Partner Violence: Not on file    No current outpatient medications on file prior to visit.   No current facility-administered medications on file prior to visit.    Allergies  Allergen Reactions  . Nasonex  [Mometasone Furoate]     "My nasal passages close up"    Family History  Problem Relation Age of Onset  . Diabetes Mother   . Diabetes Father   . Aneurysm Paternal Grandmother     BP 110/80 (BP Location: Right Arm, Patient Position: Sitting, Cuff Size: Large)   Pulse 88   Ht 5\' 4"  (1.626 m)   Wt 195 lb 9.6 oz (88.7 kg)   LMP 06/25/2020   SpO2 99%   BMI 33.57 kg/m    Review of Systems She denies hypoglycemia.      Objective:   Physical Exam VITAL SIGNS:  See vs page GENERAL: no distress Pulses: dorsalis pedis intact bilat.   MSK: no deformity of the feet CV: no leg edema Skin:  no ulcer on the feet.  normal color and temp on the feet. Neuro: sensation is intact to touch on the feet  A1c=11.4%     Assessment & Plan:  Insulin-requiring type 2 DM: uncontrolled, usually due to noncompliance.   Patient Instructions  I have sent a prescription to your pharmacy, to increase the humalog to 12 units 3 times a day (just before each meal).  check your blood sugar 4 times a day: before the 3 meals, and at bedtime.  also check if you have symptoms of your blood sugar being too high or too low.  please keep a  record of the readings and bring it to your next appointment here (or you can bring the meter itself).  You can write it on any piece of paper.  please call us sooner if your blood sugar goes below 70, or if you have a lot of readings over 200.   In view of your medical condition, you should avoid pregnancy until we have decided it is safe.  I have sent a prescription to your pharmacy, for the continuous glucose monitor.    Please come back for a follow-up appointment in 2 months.

## 2021-01-23 NOTE — Telephone Encounter (Signed)
please contact patient: Your chart says you are pregnant.  Is this true?

## 2021-01-23 NOTE — Telephone Encounter (Signed)
Please Advise

## 2021-01-24 MED ORDER — DEXCOM G6 TRANSMITTER MISC: 1.0000 | Freq: Once | 1 refills | Status: AC

## 2021-01-24 MED ORDER — DEXCOM G6 SENSOR MISC: 1.0000 | 3 refills | Status: DC

## 2021-01-24 MED ORDER — DEXCOM G6 RECEIVER DEVI: 1.0000 | Freq: Once | 1 refills | Status: AC

## 2021-01-24 NOTE — Telephone Encounter (Signed)
Spoke with pt and she stated that she miscarried in Oct 21.

## 2021-01-24 NOTE — Telephone Encounter (Signed)
Can you please change the status on yesterday's visit.  TY

## 2021-01-24 NOTE — Telephone Encounter (Signed)
It has been updated in the system that pt miscarried in Oct/21

## 2021-01-24 NOTE — Telephone Encounter (Signed)
OK, I have sent a prescription to your pharmacy.  

## 2021-01-25 IMAGING — DX DG CHEST 1V PORT
1 series · 1 of 1 positions shown · non-contrast
Comparison: December 28, 2019

CLINICAL DATA: Tachycardia and generalized body aches.

EXAM:
PORTABLE CHEST 1 VIEW

[chest ap]
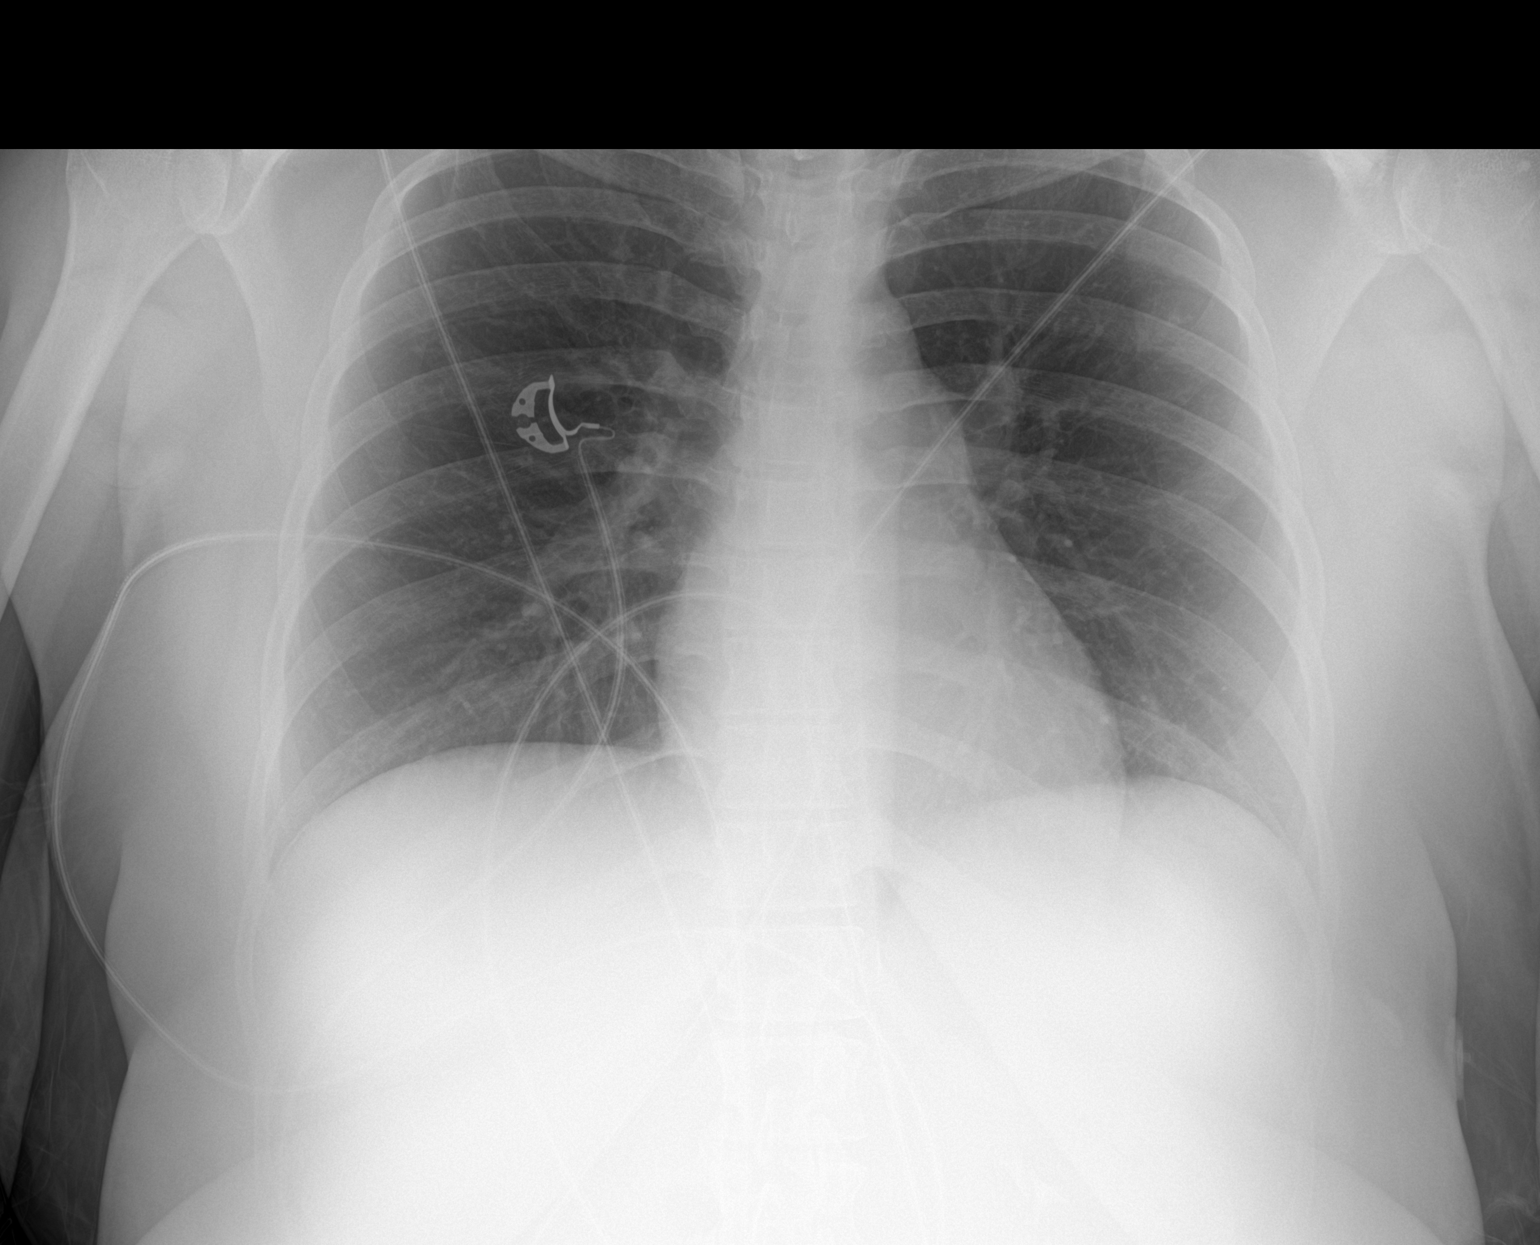

[1 of 1 positions shown; findings below may reference images not displayed]

FINDINGS: The heart size and mediastinal contours are within normal limits.
Both lungs are clear. The visualized skeletal structures are
unremarkable.
IMPRESSION: No active disease.

## 2021-02-14 DIAGNOSIS — Z419 Encounter for procedure for purposes other than remedying health state, unspecified: Secondary | ICD-10-CM | POA: Diagnosis not present

## 2021-03-25 ENCOUNTER — Ambulatory Visit: Payer: PRIVATE HEALTH INSURANCE | Admitting: Endocrinology

## 2021-03-25 ENCOUNTER — Other Ambulatory Visit: Payer: Self-pay

## 2021-03-25 ENCOUNTER — Ambulatory Visit (INDEPENDENT_AMBULATORY_CARE_PROVIDER_SITE_OTHER): Payer: PRIVATE HEALTH INSURANCE | Admitting: Endocrinology

## 2021-03-25 VITALS — BP 100/70 | HR 97 | Ht 64.0 in | Wt 204.4 lb

## 2021-03-25 DIAGNOSIS — O24819 Other pre-existing diabetes mellitus in pregnancy, unspecified trimester: Secondary | ICD-10-CM

## 2021-03-25 LAB — POCT GLYCOSYLATED HEMOGLOBIN (HGB A1C): Hemoglobin A1C: 7.7 % — AB (ref 4.0–5.6)

## 2021-03-25 MED ORDER — INSULIN LISPRO (1 UNIT DIAL) 100 UNIT/ML (KWIKPEN): 22.0000 [IU] | PEN_INJECTOR | Freq: Three times a day (TID) | SUBCUTANEOUS | 3 refills | Status: DC

## 2021-03-25 NOTE — Progress Notes (Signed)
Subjective:    Patient ID: Jill Montoya, female    DOB: June 26, 1996, 25 y.o.   MRN: [id number]  HPI Pt returns for f/u of diabetes mellitus: DM type: Insulin-requiring type 2 Dx'ed: 2015 Complications: none Therapy: insulin since 2019 pregnancy GDM: never DKA: never Severe hypoglycemia: never Pancreatitis: never Pancreatic imaging: never Other: she takes multiple daily injections; She did not tolerate Trulicity (burning at inject site). Interval history: pt says she will start using dexcom soon.  Meter is downloaded today, and the printout is scanned into the record.  cbg's vary from 76-266.  It is in general lowest with exercise.  pt states she feels well in general.  She is considering another pregnancy.  Pt says she never misses the insulin.  Past Medical History:  Diagnosis Date  . Chlamydia   . Diabetes mellitus without complication [facility])     Past Surgical History:  Procedure Laterality Date  . NO PAST SURGERIES      Social History   Socioeconomic History  . Marital status: Married    Spouse name: Not on file  . Number of children: Not on file  . Years of education: Not on file  . Highest education level: Not on file  Occupational History  . Not on file  Tobacco Use  . Smoking status: Never Smoker  . Smokeless tobacco: Never Used  Vaping Use  . Vaping Use: Never used  Substance and Sexual Activity  . Alcohol use: No  . Drug use: Never  . Sexual activity: Yes    Birth control/protection: None  Other Topics Concern  . Not on file  Social History Narrative  . Not on file   Social Determinants of Health   Financial Resource Strain: Not on file  Food Insecurity: Not on file  Transportation Needs: Not on file  Physical Activity: Not on file  Stress: Not on file  Social Connections: Not on file  Intimate Partner Violence: Not on file    Current Outpatient Medications on File Prior to Visit  Medication Sig Dispense Refill  . Continuous Blood Gluc Sensor  (DEXCOM G6 SENSOR) MISC 1 Device by Does not apply route See admin instructions. change every 10 days 9 each 3   No current facility-administered medications on file prior to visit.    Allergies  Allergen Reactions  . Nasonex [Mometasone Furoate]     "My nasal passages close up"    Family History  Problem Relation Age of Onset  . Diabetes Mother   . Diabetes Father   . Aneurysm Paternal Grandmother     BP 100/70 (BP Location: Right Arm, Patient Position: Sitting, Cuff Size: Large)   Pulse 97   Ht 5\' 4"  (1.626 m)   Wt 204 lb 6.4 oz (92.7 kg)   LMP 06/25/2020   SpO2 97%   BMI 35.09 kg/m    Review of Systems She denies hypoglycemia.      Objective:   Physical Exam VITAL SIGNS:  See vs page GENERAL: no distress Pulses: dorsalis pedis intact bilat.   MSK: no deformity of the feet CV: no leg edema.   Skin:  no ulcer on the feet.  normal color and temp on the feet.  Neuro: sensation is intact to touch on the feet.    A1c=7.6%     Assessment & Plan:  Insulin-requiring type 2 DM: uncontrolled.   Patient Instructions  check your blood sugar 4 times a day: before the 3 meals, and at bedtime.  also check  if you have symptoms of your blood sugar being too high or too low.  please keep a record of the readings and bring it to your next appointment here (or you can bring the meter itself).  You can write it on any piece of paper.  please call us sooner if your blood sugar goes below 70, or if you have a lot of readings over 200.   In view of your medical condition, you should avoid pregnancy until we have decided it is safe.  Please increase the insulin to 22 units 3 times a day (just before each meal).  However, if you are going to be active soon, take just 11 units.   Please come back for a follow-up appointment in 2 months.  we'll download the continuous glucose monitor then.

## 2021-03-25 NOTE — Patient Instructions (Addendum)
check your blood sugar 4 times a day: before the 3 meals, and at bedtime.  also check if you have symptoms of your blood sugar being too high or too low.  please keep a record of the readings and bring it to your next appointment here (or you can bring the meter itself).  You can write it on any piece of paper.  please call us sooner if your blood sugar goes below 70, or if you have a lot of readings over 200.   In view of your medical condition, you should avoid pregnancy until we have decided it is safe.  Please increase the insulin to 22 units 3 times a day (just before each meal).  However, if you are going to be active soon, take just 11 units.   Please come back for a follow-up appointment in 2 months.  we'll download the continuous glucose monitor then.

## 2021-04-09 ENCOUNTER — Encounter: Payer: Self-pay | Admitting: Endocrinology

## 2021-05-29 ENCOUNTER — Telehealth: Payer: Self-pay | Admitting: Endocrinology

## 2021-05-29 NOTE — Telephone Encounter (Signed)
Jill Montoya called to get the status of a detailed written order that was faxed on 6/16. They ask that it please be completed and sent back within 3 days as we have 3 days to complete or they will cancel the pt's order.   Ph# [phone]

## 2021-05-30 ENCOUNTER — Ambulatory Visit (INDEPENDENT_AMBULATORY_CARE_PROVIDER_SITE_OTHER): Payer: No Typology Code available for payment source | Admitting: Endocrinology

## 2021-05-30 ENCOUNTER — Other Ambulatory Visit: Payer: Self-pay

## 2021-05-30 VITALS — BP 110/62 | HR 91 | Ht 64.0 in | Wt 211.6 lb

## 2021-05-30 DIAGNOSIS — O24819 Other pre-existing diabetes mellitus in pregnancy, unspecified trimester: Secondary | ICD-10-CM

## 2021-05-30 DIAGNOSIS — Z794 Long term (current) use of insulin: Secondary | ICD-10-CM

## 2021-05-30 DIAGNOSIS — E1165 Type 2 diabetes mellitus with hyperglycemia: Secondary | ICD-10-CM | POA: Diagnosis not present

## 2021-05-30 LAB — POCT GLYCOSYLATED HEMOGLOBIN (HGB A1C): Hemoglobin A1C: 7.6 % — AB (ref 4.0–5.6)

## 2021-05-30 MED ORDER — INSULIN LISPRO (1 UNIT DIAL) 100 UNIT/ML (KWIKPEN): 24.0000 [IU] | PEN_INJECTOR | Freq: Three times a day (TID) | SUBCUTANEOUS | 3 refills | Status: DC

## 2021-05-30 NOTE — Patient Instructions (Addendum)
check your blood sugar 4 times a day: before the 3 meals, and at bedtime.  also check if you have symptoms of your blood sugar being too high or too low.  please keep a record of the readings and bring it to your next appointment here (or you can bring the meter itself).  You can write it on any piece of paper.  please call us sooner if your blood sugar goes below 70, or if you have a lot of readings over 200.   In view of your medical condition, you should avoid pregnancy until we have decided it is safe.  Please increase the insulin to 24 units 3 times a day (just before each meal).  However, if you are going to be active soon, take just 11 units.   Please come back for a follow-up appointment in 2 months.  we'll download the continuous glucose monitor then.

## 2021-05-30 NOTE — Telephone Encounter (Signed)
Message sent thru MyChart 

## 2021-05-30 NOTE — Progress Notes (Signed)
Subjective:    Patient ID: Jill Montoya, female    DOB: 11/09/1996, 25 y.o.   MRN: [id number]  HPI Pt returns for f/u of diabetes mellitus:  DM type: Insulin-requiring type 2 Dx'ed: 2015 Complications: none Therapy: insulin since 2019 pregnancy.  GDM: never DKA: never Severe hypoglycemia: never Pancreatitis: never Pancreatic imaging: never Other: she takes multiple daily injections; She did not tolerate Trulicity (burning at inject site). Interval history: pt says cbg's vary widely.  pt states she feels well in general.  She is considering another pregnancy.  Pt says she never misses the insulin.  Past Medical History:  Diagnosis Date   Chlamydia    Diabetes mellitus without complication (HCC)     Past Surgical History:  Procedure Laterality Date   NO PAST SURGERIES      Social History   Socioeconomic History   Marital status: Married    Spouse name: Not on file   Number of children: Not on file   Years of education: Not on file   Highest education level: Not on file  Occupational History   Not on file  Tobacco Use   Smoking status: Never   Smokeless tobacco: Never  Vaping Use   Vaping Use: Never used  Substance and Sexual Activity   Alcohol use: No   Drug use: Never   Sexual activity: Yes    Birth control/protection: None  Other Topics Concern   Not on file  Social History Narrative   Not on file   Social Determinants of Health   Financial Resource Strain: Not on file  Food Insecurity: Not on file  Transportation Needs: Not on file  Physical Activity: Not on file  Stress: Not on file  Social Connections: Not on file  Intimate Partner Violence: Not on file    Current Outpatient Medications on File Prior to Visit  Medication Sig Dispense Refill   Continuous Blood Gluc Sensor (DEXCOM G6 SENSOR) MISC 1 Device by Does not apply route See admin instructions. change every 10 days 9 each 3   No current facility-administered medications on file prior to  visit.    Allergies  Allergen Reactions   Nasonex [Mometasone Furoate]     "My nasal passages close up"    Family History  Problem Relation Age of Onset   Diabetes Mother    Diabetes Father    Aneurysm Paternal Grandmother     BP 110/62 (BP Location: Right Arm, Patient Position: Sitting, Cuff Size: Large)   Pulse 91   Ht 5\' 4"  (1.626 m)   Wt 211 lb 9.6 oz (96 kg)   LMP 06/25/2020   SpO2 97%   BMI 36.32 kg/m    Review of Systems She denies hypoglycemia    Objective:   Physical Exam Pulses: dorsalis pedis intact bilat.   MSK: no deformity of the feet CV: no leg edema Skin:  no ulcer on the feet.  normal color and temp on the feet. Neuro: sensation is intact to touch on the feet   Lab Results  Component Value Date   HGBA1C 7.7 (A) 03/25/2021      Assessment & Plan:  Insulin-requiring type 2 DM: uncontrolled.    Patient Instructions  check your blood sugar 4 times a day: before the 3 meals, and at bedtime.  also check if you have symptoms of your blood sugar being too high or too low.  please keep a record of the readings and bring it to your next appointment here (or  you can bring the meter itself).  You can write it on any piece of paper.  please call us sooner if your blood sugar goes below 70, or if you have a lot of readings over 200.   In view of your medical condition, you should avoid pregnancy until we have decided it is safe.  Please increase the insulin to 24 units 3 times a day (just before each meal).  However, if you are going to be active soon, take just 11 units.   Please come back for a follow-up appointment in 2 months.  we'll download the continuous glucose monitor then.

## 2021-08-01 ENCOUNTER — Other Ambulatory Visit: Payer: Self-pay | Admitting: Obstetrics and Gynecology

## 2021-08-01 DIAGNOSIS — Z363 Encounter for antenatal screening for malformations: Secondary | ICD-10-CM

## 2021-08-04 ENCOUNTER — Other Ambulatory Visit: Payer: Self-pay

## 2021-08-08 ENCOUNTER — Encounter: Payer: Self-pay | Admitting: Endocrinology

## 2021-08-08 ENCOUNTER — Ambulatory Visit (INDEPENDENT_AMBULATORY_CARE_PROVIDER_SITE_OTHER): Payer: No Typology Code available for payment source | Admitting: Endocrinology

## 2021-08-08 ENCOUNTER — Other Ambulatory Visit: Payer: Self-pay

## 2021-08-08 VITALS — BP 108/60 | HR 93 | Ht 64.0 in | Wt 214.2 lb

## 2021-08-08 DIAGNOSIS — O24819 Other pre-existing diabetes mellitus in pregnancy, unspecified trimester: Secondary | ICD-10-CM | POA: Diagnosis not present

## 2021-08-08 LAB — POCT GLYCOSYLATED HEMOGLOBIN (HGB A1C): Hemoglobin A1C: 5.9 % — AB (ref 4.0–5.6)

## 2021-08-08 MED ORDER — BASAGLAR KWIKPEN 100 UNIT/ML ~~LOC~~ SOPN: 5.0000 [IU] | PEN_INJECTOR | Freq: Every day | SUBCUTANEOUS | 3 refills | Status: DC

## 2021-08-08 NOTE — Progress Notes (Signed)
Subjective:    Patient ID: Jill Montoya, female    DOB: February 27, 1996, 25 y.o.   MRN: [id number]  HPI Pt returns for f/u of diabetes mellitus:  DM type: Insulin-requiring type 2 Dx'ed: 2015 Complications: none Therapy: insulin since 2019 pregnancy.  GDM: never DKA: never Severe hypoglycemia: never Pancreatitis: never Pancreatic imaging: never Other: she takes multiple daily injections; She did not tolerate Trulicity (burning at inject site). Interval history: pt states she feels well in general.  Pt says she never misses the insulin.  She is now 13 weeks of pregnancy. She seldom has hypoglycemia, and these episodes are mild.  She never misses the insulin. I reviewed continuous glucose monitor data.  Glucose varies from 80-220.  It is in general highest at 11AM and 8PM.  It increases overnight Past Medical History:  Diagnosis Date   Chlamydia    Diabetes mellitus without complication (HCC)     Past Surgical History:  Procedure Laterality Date   NO PAST SURGERIES      Social History   Socioeconomic History   Marital status: Married    Spouse name: Not on file   Number of children: Not on file   Years of education: Not on file   Highest education level: Not on file  Occupational History   Not on file  Tobacco Use   Smoking status: Never   Smokeless tobacco: Never  Vaping Use   Vaping Use: Never used  Substance and Sexual Activity   Alcohol use: No   Drug use: Never   Sexual activity: Yes    Birth control/protection: None  Other Topics Concern   Not on file  Social History Narrative   Not on file   Social Determinants of Health   Financial Resource Strain: Not on file  Food Insecurity: Not on file  Transportation Needs: Not on file  Physical Activity: Not on file  Stress: Not on file  Social Connections: Not on file  Intimate Partner Violence: Not on file    Current Outpatient Medications on File Prior to Visit  Medication Sig Dispense Refill   Continuous  Blood Gluc Sensor (DEXCOM G6 SENSOR) MISC 1 Device by Does not apply route See admin instructions. change every 10 days 9 each 3   insulin lispro (HUMALOG KWIKPEN) 100 UNIT/ML KwikPen Inject 24 Units into the skin 3 (three) times daily with meals. 75 mL 3   No current facility-administered medications on file prior to visit.    Allergies  Allergen Reactions   Nasonex [Mometasone Furoate]     "My nasal passages close up"    Family History  Problem Relation Age of Onset   Diabetes Mother    Diabetes Father    Aneurysm Paternal Grandmother     BP 108/60 (BP Location: Right Arm, Patient Position: Sitting, Cuff Size: Normal)   Pulse 93   Ht 5\' 4"  (1.626 m)   Wt 214 lb 3.2 oz (97.2 kg)   SpO2 98%   BMI 36.77 kg/m   Review of Systems     Objective:   Physical Exam Pulses: dorsalis pedis intact bilat.   MSK: no deformity of the feet CV: no leg edema Skin:  no ulcer on the feet.  normal color and temp on the feet. Neuro: sensation is intact to touch on the feet   Lab Results  Component Value Date   HGBA1C 5.9 (A) 08/08/2021      Assessment & Plan:  Insulin-requiring type 2 DM Pregnancy: in this setting,  she needs more aggressive rx.  Patient Instructions  check your blood sugar 4 times a day: before the 3 meals, and at bedtime.  also check if you have symptoms of your blood sugar being too high or too low.  please keep a record of the readings and bring it to your next appointment here (or you can bring the meter itself).  You can write it on any piece of paper.  please call us sooner if your blood sugar goes below 70, or if you have a lot of readings over 200.   Please continue the same Humalog I have sent a prescription to your pharmacy, to add Basaglar.   Please see a dietician also. Please come back for a follow-up appointment in 1 month.

## 2021-08-08 NOTE — Patient Instructions (Addendum)
check your blood sugar 4 times a day: before the 3 meals, and at bedtime.  also check if you have symptoms of your blood sugar being too high or too low.  please keep a record of the readings and bring it to your next appointment here (or you can bring the meter itself).  You can write it on any piece of paper.  please call us sooner if your blood sugar goes below 70, or if you have a lot of readings over 200.   Please continue the same Humalog I have sent a prescription to your pharmacy, to add Basaglar.   Please see a dietician also. Please come back for a follow-up appointment in 1 month.

## 2021-08-22 ENCOUNTER — Inpatient Hospital Stay (HOSPITAL_COMMUNITY)
Admission: AD | Admit: 2021-08-22 | Discharge: 2021-08-22 | Disposition: A | Discharge status: Home / Self Care | Payer: No Typology Code available for payment source | Attending: Obstetrics and Gynecology | Admitting: Obstetrics and Gynecology

## 2021-08-22 ENCOUNTER — Other Ambulatory Visit: Payer: Self-pay

## 2021-08-22 ENCOUNTER — Encounter (HOSPITAL_COMMUNITY): Payer: Self-pay | Admitting: Obstetrics and Gynecology

## 2021-08-22 DIAGNOSIS — R109 Unspecified abdominal pain: Secondary | ICD-10-CM

## 2021-08-22 DIAGNOSIS — O26892 Other specified pregnancy related conditions, second trimester: Secondary | ICD-10-CM | POA: Diagnosis not present

## 2021-08-22 DIAGNOSIS — Z7982 Long term (current) use of aspirin: Secondary | ICD-10-CM | POA: Diagnosis not present

## 2021-08-22 DIAGNOSIS — R1030 Lower abdominal pain, unspecified: Secondary | ICD-10-CM | POA: Insufficient documentation

## 2021-08-22 DIAGNOSIS — Z794 Long term (current) use of insulin: Secondary | ICD-10-CM | POA: Insufficient documentation

## 2021-08-22 DIAGNOSIS — O24112 Pre-existing diabetes mellitus, type 2, in pregnancy, second trimester: Secondary | ICD-10-CM | POA: Insufficient documentation

## 2021-08-22 DIAGNOSIS — Z3A14 14 weeks gestation of pregnancy: Secondary | ICD-10-CM | POA: Diagnosis not present

## 2021-08-22 LAB — URINALYSIS, ROUTINE W REFLEX MICROSCOPIC
Bacteria, UA: NONE SEEN
Bilirubin Urine: NEGATIVE
Glucose, UA: NEGATIVE mg/dL
Hgb urine dipstick: NEGATIVE
Ketones, ur: 20 mg/dL — AB
Nitrite: NEGATIVE
Protein, ur: NEGATIVE mg/dL
Specific Gravity, Urine: 1.023 (ref 1.005–1.030)
pH: 6 (ref 5.0–8.0)

## 2021-08-22 LAB — WET PREP, GENITAL
Clue Cells Wet Prep HPF POC: NONE SEEN
Sperm: NONE SEEN
Trich, Wet Prep: NONE SEEN
Yeast Wet Prep HPF POC: NONE SEEN

## 2021-08-22 NOTE — MAU Note (Signed)
.  Jill Montoya is a 25 y.o. at [redacted]w[redacted]d here in MAU reporting: lower abdominal "aching" she states has lasted a week. Has not taken anything for the pain. Has also had pelvic pressure. States she has a miscarriage last year and just wanted to make sure the baby was fine. Denies VB or abnormal discharge. Last IC was Monday.   Pain score: 7 Vitals:   08/22/21 0846  BP: (!) 122/58  Pulse: 95  Resp: 14  Temp: 99 F (37.2 C)  SpO2: 100%     FHT:152 Lab orders placed from triage:  UA

## 2021-08-22 NOTE — MAU Provider Note (Signed)
History     CSN: [id number]  Arrival date and time: 08/22/21 0829   None     No chief complaint on file.  HPI  Ms.Jill Montoya is a 25 y.o. female [phone] @ [redacted]w[redacted]d here in MAU with complaints of lower abdominal aching. The aching pain started last week. The pain is constant this week. When is first started it was coming and going. At times she feels quick, sharp pains in her lower pelvis. The pain has been so uncomfortable that at times it wakes her up at night. No bleeding or discharge. She has not tried anything over the counter for the pain. She currently rates her discomfort 4/10.  History of 33 week delivery 2/2 to PPROM.   OB History     Gravida  3   Para  1   Term      Preterm  1   AB  1   Living  1      SAB  1   IAB      Ectopic      Multiple      Live Births              Past Medical History:  Diagnosis Date   Chlamydia    Diabetes mellitus without complication (HCC)     Past Surgical History:  Procedure Laterality Date   NO PAST SURGERIES      Family History  Problem Relation Age of Onset   Diabetes Mother    Diabetes Father    Aneurysm Paternal Grandmother     Social History   Tobacco Use   Smoking status: Never   Smokeless tobacco: Never  Vaping Use   Vaping Use: Never used  Substance Use Topics   Alcohol use: No   Drug use: Never    Allergies:  Allergies  Allergen Reactions   Nasonex [Mometasone Furoate]     "My nasal passages close up"    Medications Prior to Admission  Medication Sig Dispense Refill Last Dose   aspirin EC 81 MG tablet Take 81 mg by mouth daily. Swallow whole.   08/22/2021   Continuous Blood Gluc Sensor (DEXCOM G6 SENSOR) MISC 1 Device by Does not apply route See admin instructions. change every 10 days 9 each 3 08/22/2021   Insulin Glargine (BASAGLAR KWIKPEN) 100 UNIT/ML Inject 5 Units into the skin at bedtime. 15 mL 3 08/21/2021   insulin lispro (HUMALOG KWIKPEN) 100 UNIT/ML KwikPen Inject 24 Units  into the skin 3 (three) times daily with meals. 75 mL 3 08/21/2021   Prenatal Vit-Fe Fumarate-FA (PRENATAL MULTIVITAMIN) TABS tablet Take 1 tablet by mouth daily at 12 noon.   08/22/2021   valACYclovir (VALTREX) 500 MG tablet Take 500 mg by mouth daily.   08/22/2021   Results for orders placed or performed during the hospital encounter of 08/22/21 (from the past 48 hour(s))  Urinalysis, Routine w reflex microscopic Urine, Clean Catch     Status: Abnormal   Collection Time: 08/22/21  8:37 AM  Result Value Ref Range   Color, Urine YELLOW YELLOW   APPearance HAZY (A) CLEAR   Specific Gravity, Urine 1.023 1.005 - 1.030   pH 6.0 5.0 - 8.0   Glucose, UA NEGATIVE NEGATIVE mg/dL   Hgb urine dipstick NEGATIVE NEGATIVE   Bilirubin Urine NEGATIVE NEGATIVE   Ketones, ur 20 (A) NEGATIVE mg/dL   Protein, ur NEGATIVE NEGATIVE mg/dL   Nitrite NEGATIVE NEGATIVE   Leukocytes,Ua SMALL (A) NEGATIVE   RBC /  HPF 0-5 0 - 5 RBC/hpf   WBC, UA 0-5 0 - 5 WBC/hpf   Bacteria, UA NONE SEEN NONE SEEN   Squamous Epithelial / LPF 6-10 0 - 5   Mucus PRESENT     Comment: Performed at [facility], [address] [place], [place] [address]  Wet prep, genital     Status: Abnormal   Collection Time: 08/22/21  9:50 AM   Specimen: Vaginal  Result Value Ref Range   Yeast Wet Prep HPF POC NONE SEEN NONE SEEN   Trich, Wet Prep NONE SEEN NONE SEEN   Clue Cells Wet Prep HPF POC NONE SEEN NONE SEEN   WBC, Wet Prep HPF POC MANY (A) NONE SEEN   Sperm NONE SEEN     Comment: Performed at [facility], [address], [place], [place] [address]     Review of Systems  Constitutional:  Negative for fever.  Gastrointestinal:  Positive for abdominal pain.  Genitourinary:  Negative for vaginal bleeding and vaginal discharge.  Physical Exam   Blood pressure 116/65, pulse (!) 105, temperature 99 F (37.2 C), temperature source Oral, resp. rate 14, SpO2 100 %, unknown if currently breastfeeding.  Physical Exam Vitals  and nursing note reviewed.  Constitutional:      General: She is not in acute distress.    Appearance: Normal appearance. She is not ill-appearing, toxic-appearing or diaphoretic.  HENT:     Head: Normocephalic.  Genitourinary:    Comments: Wet prep and GC collected without speculum Cervix is long, closed, posterior. No blood noted on exam glove. Exam by Venia Carbon, NP  Neurological:     Mental Status: She is alert and oriented to person, place, and time.   MAU Course  Procedures  MDM  + Fetal heart tones via doppler B positive blood type.  Patient with history of 1st trimester loss and is more anxious with this pregnancy. Patient feels reassured after exam and hearing fetal heart tones.   Assessment and Plan   A:  1. Abdominal pain in pregnancy, second trimester   2. [redacted] weeks gestation of pregnancy     P:  Discharge home in stable condition F/u with Dr. Richardson Dopp next week Return to MAU if symptoms worsen  Ok to use tylenol, ibuprofen is ok sparingly in the 2nd trimester only.  Ketones in urine- encouraged more water intake.    Venia Carbon I, NP 08/22/2021 2:22 PM

## 2021-08-23 LAB — CULTURE, OB URINE

## 2021-08-25 LAB — GC/CHLAMYDIA PROBE AMP (~~LOC~~) NOT AT ARMC
Chlamydia: NEGATIVE
Comment: NEGATIVE
Comment: NORMAL
Neisseria Gonorrhea: NEGATIVE

## 2021-09-03 ENCOUNTER — Encounter: Payer: Self-pay | Admitting: Endocrinology

## 2021-09-03 ENCOUNTER — Telehealth: Payer: Self-pay | Admitting: Endocrinology

## 2021-09-03 NOTE — Telephone Encounter (Signed)
NA

## 2021-09-04 ENCOUNTER — Other Ambulatory Visit: Payer: Self-pay

## 2021-09-04 DIAGNOSIS — O24819 Other pre-existing diabetes mellitus in pregnancy, unspecified trimester: Secondary | ICD-10-CM

## 2021-09-04 MED ORDER — DEXCOM G6 TRANSMITTER MISC
3 refills | Status: DC
Start: 2021-09-04 — End: 2021-10-17

## 2021-09-04 MED ORDER — DEXCOM G6 SENSOR MISC: 1.0000 | 3 refills | Status: DC

## 2021-09-04 MED ORDER — FREESTYLE LANCETS MISC: 12 refills | Status: DC

## 2021-09-04 MED ORDER — FREESTYLE LITE DEVI: 0 refills | Status: DC

## 2021-09-04 MED ORDER — FREESTYLE LITE TEST VI STRP: ORAL_STRIP | 12 refills | Status: DC

## 2021-09-09 ENCOUNTER — Other Ambulatory Visit (HOSPITAL_COMMUNITY): Payer: Self-pay

## 2021-09-10 ENCOUNTER — Other Ambulatory Visit (HOSPITAL_COMMUNITY): Payer: Self-pay

## 2021-09-10 ENCOUNTER — Telehealth: Payer: Self-pay | Admitting: Pharmacy Technician

## 2021-09-10 NOTE — Telephone Encounter (Signed)
Patient Advocate Encounter   Received notification from CoverMyMeds that prior authorization for Dexcom is required.   PA submitted on 09/10/21 Key [id number]) Status is pending    [facility] will continue to follow:   Sherilyn Dacosta, CPhT Patient Advocate [facility] Phone: [phone] Fax:  [phone]

## 2021-09-12 ENCOUNTER — Ambulatory Visit (INDEPENDENT_AMBULATORY_CARE_PROVIDER_SITE_OTHER): Payer: No Typology Code available for payment source | Admitting: Endocrinology

## 2021-09-12 ENCOUNTER — Other Ambulatory Visit: Payer: Self-pay

## 2021-09-12 ENCOUNTER — Encounter: Payer: Self-pay | Admitting: Dietician

## 2021-09-12 ENCOUNTER — Encounter: Payer: No Typology Code available for payment source | Attending: Endocrinology | Admitting: Dietician

## 2021-09-12 DIAGNOSIS — O24819 Other pre-existing diabetes mellitus in pregnancy, unspecified trimester: Secondary | ICD-10-CM | POA: Insufficient documentation

## 2021-09-12 DIAGNOSIS — I959 Hypotension, unspecified: Secondary | ICD-10-CM | POA: Diagnosis not present

## 2021-09-12 LAB — CBC WITH DIFFERENTIAL/PLATELET
Basophils Absolute: 0 10*3/uL (ref 0.0–0.1)
Basophils Relative: 0.3 % (ref 0.0–3.0)
Eosinophils Absolute: 0.1 10*3/uL (ref 0.0–0.7)
Eosinophils Relative: 1.1 % (ref 0.0–5.0)
HCT: 36.1 % (ref 36.0–46.0)
Hemoglobin: 12 g/dL (ref 12.0–15.0)
Lymphocytes Relative: 24.8 % (ref 12.0–46.0)
Lymphs Abs: 2 10*3/uL (ref 0.7–4.0)
MCHC: 33.2 g/dL (ref 30.0–36.0)
MCV: 91 fl (ref 78.0–100.0)
Monocytes Absolute: 0.6 10*3/uL (ref 0.1–1.0)
Monocytes Relative: 7.8 % (ref 3.0–12.0)
Neutro Abs: 5.4 10*3/uL (ref 1.4–7.7)
Neutrophils Relative %: 66 % (ref 43.0–77.0)
Platelets: 344 10*3/uL (ref 150.0–400.0)
RBC: 3.97 Mil/uL (ref 3.87–5.11)
RDW: 12.6 % (ref 11.5–15.5)
WBC: 8.2 10*3/uL (ref 4.0–10.5)

## 2021-09-12 LAB — CORTISOL: Cortisol, Plasma: 6.9 ug/dL

## 2021-09-12 LAB — BASIC METABOLIC PANEL
BUN: 7 mg/dL (ref 6–23)
CO2: 24 mEq/L (ref 19–32)
Calcium: 9.3 mg/dL (ref 8.4–10.5)
Chloride: 102 mEq/L (ref 96–112)
Creatinine, Ser: 0.58 mg/dL (ref 0.40–1.20)
GFR: 125.93 mL/min (ref >60.00)
Glucose, Bld: 141 mg/dL — ABNORMAL HIGH (ref 70–99)
Potassium: 4 mEq/L (ref 3.5–5.1)
Sodium: 133 mEq/L — ABNORMAL LOW (ref 135–145)

## 2021-09-12 LAB — IBC PANEL
Iron: 131 ug/dL (ref 42–145)
Saturation Ratios: 33.2 % (ref 20.0–50.0)
TIBC: 394.8 ug/dL (ref 250.0–450.0)
Transferrin: 282 mg/dL (ref 212.0–360.0)

## 2021-09-12 LAB — TSH: TSH: 1.1 u[IU]/mL (ref 0.35–5.50)

## 2021-09-12 MED ORDER — BASAGLAR KWIKPEN 100 UNIT/ML ~~LOC~~ SOPN: 12.0000 [IU] | PEN_INJECTOR | Freq: Every day | SUBCUTANEOUS | 3 refills | Status: DC

## 2021-09-12 NOTE — Patient Instructions (Addendum)
check your blood sugar 4 times a day: before the 3 meals, and at bedtime.  also check if you have symptoms of your blood sugar being too high or too low.  please keep a record of the readings and bring it to your next appointment here (or you can bring the meter itself).  You can write it on any piece of paper.  please call us sooner if your blood sugar goes below 70, or if you have a lot of readings over 200.   Please continue the same Humalog I have sent a prescription to your pharmacy, to increase the Basaglar to 12 units at bedtime.   Please see a dietician today as scheduled.   Blood tests are requested for you today.  We'll let you know about the results.  Please come back for a follow-up appointment in 1 month.

## 2021-09-12 NOTE — Patient Instructions (Signed)
Continue to stay active daily. Continue to check your blood sugar and evaluate your eating. Small amounts of protein with each meal (nuts or low sugar yogurt for breakfast)  Consider looking at the [facility] website to see if a pump would be covered.

## 2021-09-12 NOTE — Progress Notes (Signed)
Subjective:    Patient ID: Jill Montoya, female    DOB: Nov 23, 1995, 25 y.o.   MRN: [id number]  HPI Pt returns for f/u of diabetes mellitus:  DM type: Insulin-requiring type 2 Dx'ed: 2015 Complications: none Therapy: insulin since 2019 pregnancy.  GDM: never DKA: never Severe hypoglycemia: never Pancreatitis: never Pancreatic imaging: never Other: she takes multiple daily injections; She did not tolerate Trulicity (burning at inject site). Interval history: pt states she feels well in general.  Pt says she never misses the insulin.  She is now at 18 weeks of pregnancy. She seldom has hypoglycemia, and these episodes are mild.  She never misses the insulin. She is having trouble obtaining continuous glucose monitor sensors.  she brings a record of her cbg's which I have reviewed today.  Cbg varies from 67-171.  It is in general highest fasting, but there is little trend throughout the day.   Past Medical History:  Diagnosis Date   Chlamydia    Diabetes mellitus without complication (HCC)     Past Surgical History:  Procedure Laterality Date   NO PAST SURGERIES      Social History   Socioeconomic History   Marital status: Married    Spouse name: Not on file   Number of children: Not on file   Years of education: Not on file   Highest education level: Not on file  Occupational History   Not on file  Tobacco Use   Smoking status: Never   Smokeless tobacco: Never  Vaping Use   Vaping Use: Never used  Substance and Sexual Activity   Alcohol use: No   Drug use: Never   Sexual activity: Yes    Birth control/protection: None  Other Topics Concern   Not on file  Social History Narrative   Not on file   Social Determinants of Health   Financial Resource Strain: Not on file  Food Insecurity: Not on file  Transportation Needs: Not on file  Physical Activity: Not on file  Stress: Not on file  Social Connections: Not on file  Intimate Partner Violence: Not on file     Current Outpatient Medications on File Prior to Visit  Medication Sig Dispense Refill   aspirin EC 81 MG tablet Take 81 mg by mouth daily. Swallow whole.     Blood Glucose Monitoring Suppl (FREESTYLE LITE) DEVI Use As Directed 1 each 0   Continuous Blood Gluc Sensor (DEXCOM G6 SENSOR) MISC 1 Device by Does not apply route See admin instructions. change every 10 days 9 each 3   Continuous Blood Gluc Transmit (DEXCOM G6 TRANSMITTER) MISC Change every 3 mos 1 each 3   glucose blood (FREESTYLE LITE) test strip Use to check BS 4x a day 100 each 12   insulin lispro (HUMALOG KWIKPEN) 100 UNIT/ML KwikPen Inject 24 Units into the skin 3 (three) times daily with meals. 75 mL 3   Lancets (FREESTYLE) lancets Use as instructed 100 each 12   Prenatal Vit-Fe Fumarate-FA (PRENATAL MULTIVITAMIN) TABS tablet Take 1 tablet by mouth daily at 12 noon.     valACYclovir (VALTREX) 500 MG tablet Take 500 mg by mouth daily.     No current facility-administered medications on file prior to visit.    Allergies  Allergen Reactions   Nasonex [Mometasone Furoate]     "My nasal passages close up"    Family History  Problem Relation Age of Onset   Diabetes Mother    Diabetes Father    Aneurysm  Paternal Grandmother     BP (!) 82/40 (BP Location: Right Arm, Patient Position: Sitting, Cuff Size: Large)   Pulse (!) 114   Ht 5\' 4"  (1.626 m)   Wt 219 lb 6.4 oz (99.5 kg)   SpO2 98%   BMI 37.66 kg/m    Review of Systems Denies bleeding, fever, or sob    Objective:   Physical Exam      Assessment & Plan:  Insulin-requiring type 2 DM during pregnancy: uncontrolled.  Hypotension: asymptomatic.  uncertain etiology.  I advised prompt f/u with Dr .   Patient Instructions  check your blood sugar 4 times a day: before the 3 meals, and at bedtime.  also check if you have symptoms of your blood sugar being too high or too low.  please keep a record of the readings and bring it to your next appointment  here (or you can bring the meter itself).  You can write it on any piece of paper.  please call Nehemiah Settle sooner if your blood sugar goes below 70, or if you have a lot of readings over 200.   Please continue the same Humalog I have sent a prescription to your pharmacy, to increase the Basaglar to 12 units at bedtime.   Please see a dietician today as scheduled.   Blood tests are requested for you today.  We'll let you know about the results.  Please come back for a follow-up appointment in 1 month.

## 2021-09-12 NOTE — Progress Notes (Signed)
Diabetes Self-Management Education  Visit Type: First/Initial  Appt. Start Time: 0910 Appt. End Time: 1010  09/12/2021  [name], identified by name and date of birth, is a 25 y.o. female with a diagnosis of Diabetes: Type 2.   ASSESSMENT Patient is here today alone.  She is [redacted] weeks gestation.  She is having hypotension. (80/42 in office today) - discussed need for increased hydration.  Patient reports working on this.  History includes:  Type 2 diabetes - 1.5 per patient (dx age 75) A1C 5.9% 08/08/2021 decreased from 7.6% 05/30/2021 Medications include:  Basaglar 12 units q HS and Humalog 24 units before each meal. She has used the Dexcom recently but is currently working on insurance issues regarding this.  Patient lives with her husband and 15 yo daughter. 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  She is currently working from home at the [employer]. Her father has has type 1 diabetes and has had a Kidney and Pancrease transplant. She tries to walk for 10 minutes after each meal.  She also has a home gym.    Height 5\' 4"  (1.626 m), weight 219 lb (99.3 kg), unknown if currently breastfeeding. Body mass index is 37.59 kg/m.   Diabetes Self-Management Education - 09/12/21 0924       Visit Information   Visit Type First/Initial      Initial Visit   Diabetes Type Type 2    Are you currently following a meal plan? No    Are you taking your medications as prescribed? Yes    Date Diagnosed 2015      Health Coping   How would you rate your overall health? Good      Psychosocial Assessment   Patient Belief/Attitude about Diabetes Motivated to manage diabetes    Self-care barriers None    Self-management support Doctor's office;Family    Other persons present Patient    Patient Concerns Nutrition/Meal planning    Special Needs None    Learning Readiness Change in progress    How often do you need to have someone help  you when you read instructions, pamphlets, or other written materials from your doctor or pharmacy? 1 - Never    What is the last grade level you completed in school? Master's Degree      Pre-Education Assessment   Patient understands the diabetes disease and treatment process. Needs Instruction    Patient understands incorporating nutritional management into lifestyle. Needs Instruction    Patient undertands incorporating physical activity into lifestyle. Needs Instruction    Patient understands using medications safely. Needs Instruction    Patient understands monitoring blood glucose, interpreting and using results Needs Instruction    Patient understands prevention, detection, and treatment of acute complications. Needs Instruction    Patient understands prevention, detection, and treatment of chronic complications. Needs Instruction    Patient understands how to develop strategies to address psychosocial issues. Needs Instruction    Patient understands how to develop strategies to promote health/change behavior. Needs Instruction      Complications   Last HgB A1C per patient/outside source 5.9 %   08/08/2021 decreased from 7.6% 05/30/2021   How often do you check your blood sugar? > 4 times/day    Fasting Blood glucose range (mg/dL) 06/01/2021   711-657   Postprandial Blood glucose range (mg/dL) 903-833   38-329;191-660   Number of hypoglycemic episodes per month 0    Number of hyperglycemic episodes  per week 1    Have you had a dilated eye exam in the past 12 months? No    Have you had a dental exam in the past 12 months? No    Are you checking your feet? Yes    How many days per week are you checking your feet? 1      Dietary Intake   Breakfast Bagel with butter OR leftovers OR 1 pancake and no syrup    Snack (morning) none    Lunch Chicken, mashed potatoes, broccoli OR [place] chicken with white rice and broccoli    Snack (afternoon) occasional peanuts    Dinner salad with grilled  chicken OR chicken, rice, vegetable    Snack (evening) none    Beverage(s) water      Exercise   Exercise Type Light (walking / raking leaves)   10 minute walk after each meal     Patient Education   Disease state  Other (comment)   review   Nutrition management  Role of diet in the treatment of diabetes and the relationship between the three main macronutrients and blood glucose level;Food label reading, portion sizes and measuring food.;Carbohydrate counting;Meal options for control of blood glucose level and chronic complications.;Meal timing in regards to the patients' current diabetes medication.    Physical activity and exercise  Role of exercise on diabetes management, blood pressure control and cardiac health.;Helped patient identify appropriate exercises in relation to his/her diabetes, diabetes complications and other health issue.    Medications Reviewed patients medication for diabetes, action, purpose, timing of dose and side effects.    Monitoring Identified appropriate SMBG and/or A1C goals.;Yearly dilated eye exam;Daily foot exams    Acute complications Taught treatment of hypoglycemia - the 15 rule.    Chronic complications Assessed and discussed foot care and prevention of foot problems    Psychosocial adjustment Role of stress on diabetes    Preconception care Other (comment)   goals related to current pregnancy     Individualized Goals (developed by patient)   Nutrition Follow meal plan discussed    Physical Activity Exercise 5-7 days per week;30 minutes per day    Medications take my medication as prescribed    Monitoring  test my blood glucose as discussed    Reducing Risk examine blood glucose patterns    Health Coping discuss diabetes with (comment)      Post-Education Assessment   Patient understands the diabetes disease and treatment process. Demonstrates understanding / competency    Patient understands incorporating nutritional management into lifestyle. Needs  Review    Patient undertands incorporating physical activity into lifestyle. Demonstrates understanding / competency    Patient understands using medications safely. Demonstrates understanding / competency    Patient understands monitoring blood glucose, interpreting and using results Demonstrates understanding / competency    Patient understands prevention, detection, and treatment of acute complications. Demonstrates understanding / competency    Patient understands prevention, detection, and treatment of chronic complications. Demonstrates understanding / competency    Patient understands how to develop strategies to address psychosocial issues. Demonstrates understanding / competency    Patient understands how to develop strategies to promote health/change behavior. Needs Review      Outcomes   Expected Outcomes Demonstrated interest in learning. Expect positive outcomes    Future DMSE 4-6 wks    Program Status Not Completed             Individualized Plan for Diabetes Self-Management Training:   Learning  Objective:  Patient will have a greater understanding of diabetes self-management. Patient education plan is to attend individual and/or group sessions per assessed needs and concerns.   Plan:   Patient Instructions  Continue to stay active daily. Continue to check your blood sugar and evaluate your eating. Small amounts of protein with each meal (nuts or low sugar yogurt for breakfast)  Consider looking at the [facility] website to see if a pump would be covered.  Expected Outcomes:  Demonstrated interest in learning. Expect positive outcomes  Education material provided: Meal plan card, label reading, pre-existing diabetes during pregnancy  If problems or questions, patient to contact team via:  Phone  Future DSME appointment: 4-6 wks

## 2021-09-17 NOTE — Telephone Encounter (Signed)
Received a fax regarding Prior Authorization from [employer] for DEXCOM G6. Authorization has been DENIED because:  Per your health plan's criteria, this drug is covered if you meet the following: (1) You are following a treatment plan and an education program on your condition (current diabetes treatment plan and diabetes education and support). (2) You are treated with multiple (three or more) daily injections of insulin or with an insulin pump (continuous subcutaneous insulin infusion pump). (3) One of the following: (A) You just started using (new user) continuous glucose monitors (CGM) products and you need to adjust your insulin dose often each day [on basis of your blood glucose monitor or continuous glucose monitor (CGM) testing results]. (B) You have been using (established user) continuous glucose monitors (CGM) products and your blood sugar (glucose) levels are stable and you do not need to adjust your insulin dose often each day. The information provided does not show that you meet the criteria listed above.

## 2021-09-20 ENCOUNTER — Emergency Department (HOSPITAL_COMMUNITY): Payer: No Typology Code available for payment source

## 2021-09-20 ENCOUNTER — Emergency Department (HOSPITAL_COMMUNITY)
Admission: EM | Admit: 2021-09-20 | Discharge: 2021-09-20 | Disposition: A | Discharge status: Home / Self Care | Payer: No Typology Code available for payment source | Attending: Emergency Medicine | Admitting: Emergency Medicine

## 2021-09-20 DIAGNOSIS — J101 Influenza due to other identified influenza virus with other respiratory manifestations: Secondary | ICD-10-CM | POA: Insufficient documentation

## 2021-09-20 DIAGNOSIS — J111 Influenza due to unidentified influenza virus with other respiratory manifestations: Secondary | ICD-10-CM

## 2021-09-20 DIAGNOSIS — Z20822 Contact with and (suspected) exposure to covid-19: Secondary | ICD-10-CM | POA: Insufficient documentation

## 2021-09-20 DIAGNOSIS — Z7982 Long term (current) use of aspirin: Secondary | ICD-10-CM | POA: Insufficient documentation

## 2021-09-20 DIAGNOSIS — E119 Type 2 diabetes mellitus without complications: Secondary | ICD-10-CM | POA: Insufficient documentation

## 2021-09-20 DIAGNOSIS — Z794 Long term (current) use of insulin: Secondary | ICD-10-CM | POA: Diagnosis not present

## 2021-09-20 DIAGNOSIS — R059 Cough, unspecified: Secondary | ICD-10-CM | POA: Diagnosis present

## 2021-09-20 LAB — URINALYSIS, ROUTINE W REFLEX MICROSCOPIC
Bilirubin Urine: NEGATIVE
Glucose, UA: NEGATIVE mg/dL
Hgb urine dipstick: NEGATIVE
Ketones, ur: NEGATIVE mg/dL
Leukocytes,Ua: NEGATIVE
Nitrite: NEGATIVE
Protein, ur: NEGATIVE mg/dL
Specific Gravity, Urine: 1.011 (ref 1.005–1.030)
pH: 7 (ref 5.0–8.0)

## 2021-09-20 LAB — COMPREHENSIVE METABOLIC PANEL
ALT: 16 U/L (ref 0–44)
AST: 18 U/L (ref 15–41)
Albumin: 3.2 g/dL — ABNORMAL LOW (ref 3.5–5.0)
Alkaline Phosphatase: 51 U/L (ref 38–126)
Anion gap: 8 (ref 5–15)
BUN: 7 mg/dL (ref 6–20)
CO2: 22 mmol/L (ref 22–32)
Calcium: 8.5 mg/dL — ABNORMAL LOW (ref 8.9–10.3)
Chloride: 105 mmol/L (ref 98–111)
Creatinine, Ser: 0.52 mg/dL (ref 0.44–1.00)
GFR, Estimated: >60 mL/min (ref >60)
Glucose, Bld: 68 mg/dL — ABNORMAL LOW (ref 70–99)
Potassium: 3.5 mmol/L (ref 3.5–5.1)
Sodium: 135 mmol/L (ref 135–145)
Total Bilirubin: 0.3 mg/dL (ref 0.3–1.2)
Total Protein: 6.8 g/dL (ref 6.5–8.1)

## 2021-09-20 LAB — CBC WITH DIFFERENTIAL/PLATELET
Abs Immature Granulocytes: 0.06 10*3/uL (ref 0.00–0.07)
Basophils Absolute: 0 10*3/uL (ref 0.0–0.1)
Basophils Relative: 0 %
Eosinophils Absolute: 0.2 10*3/uL (ref 0.0–0.5)
Eosinophils Relative: 2 %
HCT: 34.2 % — ABNORMAL LOW (ref 36.0–46.0)
Hemoglobin: 11.9 g/dL — ABNORMAL LOW (ref 12.0–15.0)
Immature Granulocytes: 1 %
Lymphocytes Relative: 21 %
Lymphs Abs: 1.8 10*3/uL (ref 0.7–4.0)
MCH: 30.7 pg (ref 26.0–34.0)
MCHC: 34.8 g/dL (ref 30.0–36.0)
MCV: 88.4 fL (ref 80.0–100.0)
Monocytes Absolute: 1 10*3/uL (ref 0.1–1.0)
Monocytes Relative: 11 %
Neutro Abs: 5.6 10*3/uL (ref 1.7–7.7)
Neutrophils Relative %: 65 %
Platelets: 315 10*3/uL (ref 150–400)
RBC: 3.87 MIL/uL (ref 3.87–5.11)
RDW: 12.1 % (ref 11.5–15.5)
WBC: 8.6 10*3/uL (ref 4.0–10.5)
nRBC: 0 % (ref 0.0–0.2)

## 2021-09-20 LAB — RESP PANEL BY RT-PCR (FLU A&B, COVID) ARPGX2
Influenza A by PCR: POSITIVE — AB
Influenza B by PCR: NEGATIVE
SARS Coronavirus 2 by RT PCR: NEGATIVE

## 2021-09-20 LAB — CBG MONITORING, ED
Glucose-Capillary: 69 mg/dL — ABNORMAL LOW (ref 70–99)
Glucose-Capillary: 84 mg/dL (ref 70–99)

## 2021-09-20 MED ORDER — OSELTAMIVIR PHOSPHATE 75 MG PO CAPS
75.0000 mg | ORAL_CAPSULE | Freq: Two times a day (BID) | ORAL | 0 refills | Status: DC
Start: 2021-09-20 — End: 2021-10-19

## 2021-09-20 MED ORDER — OSELTAMIVIR PHOSPHATE 75 MG PO CAPS
75.0000 mg | ORAL_CAPSULE | Freq: Once | ORAL | Status: AC
  Administered 2021-09-20: 75 mg via ORAL
  Filled 2021-09-20: qty 1

## 2021-09-20 MED ORDER — ACETAMINOPHEN 500 MG PO TABS
1000.0000 mg | ORAL_TABLET | Freq: Once | ORAL | Status: AC
  Administered 2021-09-20: 1000 mg via ORAL
  Filled 2021-09-20: qty 2

## 2021-09-20 MED ORDER — SODIUM CHLORIDE 0.9 % IV BOLUS
1000.0000 mL | Freq: Once | INTRAVENOUS | Status: AC
  Administered 2021-09-20: 1000 mL via INTRAVENOUS

## 2021-09-20 MED ORDER — ONDANSETRON 4 MG PO TBDP: 4.0000 mg | ORAL_TABLET | Freq: Three times a day (TID) | ORAL | 0 refills | Status: DC | PRN

## 2021-09-20 NOTE — ED Notes (Signed)
CBG increased to 84.

## 2021-09-20 NOTE — ED Provider Notes (Signed)
[facility]-EMERGENCY DEPT Provider Note   CSN: [id number] Arrival date & time: 09/20/21  1501     History No chief complaint on file.   Jill Montoya is a 25 y.o. female.  25 year old female with a medical history as detailed below presents for evaluation.  Patient reports that she developed URI symptoms approximately 5 days prior.  She reports that her 56-year-old daughter had similar symptoms.  She reports persistent cough, runny nose, and body aches.  Symptoms have improved.  However she feels that her cough is persistent.  She denies associated shortness of breath.  She denies chest pain.  She reports performing 1 COVID at home test that was negative on Tuesday.  Patient reports that her sugars have been in the 200s when she checked in the last several days.  She has not taken anything for her symptoms today except for some Tylenol around 7 AM.  She denies abdominal pain.  She denies urinary symptoms.  She is approximately [redacted] weeks pregnant.  She is followed by Dr. Richardson Dopp with [facility].  The history is provided by the patient.  Illness Location:  Cough, malaise, fatigue, URI symptoms Severity:  Mild Onset quality:  Gradual Duration:  5 hours Timing:  Constant Progression:  Unchanged Chronicity:  New     Past Medical History:  Diagnosis Date   Chlamydia    Diabetes mellitus without complication (HCC)     Patient Active Problem List   Diagnosis Date Noted   Hypotension 09/12/2021   Acute gastroenteritis 03/10/2020   Insulin-requiring or dependent type II diabetes mellitus (HCC) 03/10/2020   Preterm premature rupture of membranes 07/04/2018   GBS (group b Streptococcus) UTI complicating pregnancy, third trimester 07/01/2018   Cutaneous candidiasis 04/10/2018   Diabetes mellitus during pregnancy, antepartum 04/07/2018    Past Surgical History:  Procedure Laterality Date   NO PAST SURGERIES       OB History     Gravida  3   Para  1   Term       Preterm  1   AB  1   Living  1      SAB  1   IAB      Ectopic      Multiple      Live Births              Family History  Problem Relation Age of Onset   Diabetes Mother    Diabetes Father    Aneurysm Paternal Grandmother     Social History   Tobacco Use   Smoking status: Never   Smokeless tobacco: Never  Vaping Use   Vaping Use: Never used  Substance Use Topics   Alcohol use: No   Drug use: Never    Home Medications Prior to Admission medications   Medication Sig Start Date End Date Taking? Authorizing Provider  aspirin EC 81 MG tablet Take 81 mg by mouth daily. Swallow whole.    [provider]  Blood Glucose Monitoring Suppl (FREESTYLE LITE) DEVI Use As Directed 09/04/21   Romero Belling, MD  Continuous Blood Gluc Sensor (DEXCOM G6 SENSOR) MISC 1 Device by Does not apply route See admin instructions. change every 10 days 09/04/21   Romero Belling, MD  Continuous Blood Gluc Transmit (DEXCOM G6 TRANSMITTER) MISC Change every 3 mos 09/04/21   Romero Belling, MD  glucose blood (FREESTYLE LITE) test strip Use to check BS 4x a day 09/04/21   Romero Belling, MD  Insulin Glargine (BASAGLAR KWIKPEN) 100 UNIT/ML Inject 12 Units into the skin at bedtime. 09/12/21   Romero Belling, MD  insulin lispro (HUMALOG KWIKPEN) 100 UNIT/ML KwikPen Inject 24 Units into the skin 3 (three) times daily with meals. 05/30/21   Romero Belling, MD  Lancets (FREESTYLE) lancets Use as instructed 09/04/21   Romero Belling, MD  Prenatal Vit-Fe Fumarate-FA (PRENATAL MULTIVITAMIN) TABS tablet Take 1 tablet by mouth daily at 12 noon.    [provider]  progesterone (PROMETRIUM) 200 MG capsule Take 200 mg by mouth daily.    [provider]  valACYclovir (VALTREX) 500 MG tablet Take 500 mg by mouth daily.    [provider]    Allergies    Nasonex [mometasone furoate]  Review of Systems   Review of Systems  All other systems reviewed and are  negative.  Physical Exam Updated Vital Signs BP 121/68 (BP Location: Right Arm)   Pulse (!) 141   Temp 99.2 F (37.3 C) (Oral)   Resp 16   LMP 05/10/2021   SpO2 98%   Physical Exam Vitals and nursing note reviewed.  Constitutional:      General: She is not in acute distress.    Appearance: Normal appearance. She is well-developed.  HENT:     Head: Normocephalic and atraumatic.  Eyes:     Conjunctiva/sclera: Conjunctivae normal.     Pupils: Pupils are equal, round, and reactive to light.  Cardiovascular:     Rate and Rhythm: Regular rhythm. Tachycardia present.     Heart sounds: Normal heart sounds.  Pulmonary:     Effort: Pulmonary effort is normal. No respiratory distress.     Breath sounds: Normal breath sounds.  Abdominal:     General: There is no distension.     Palpations: Abdomen is soft.     Tenderness: There is no abdominal tenderness.  Musculoskeletal:        General: No deformity. Normal range of motion.     Cervical back: Normal range of motion and neck supple.  Skin:    General: Skin is warm and dry.  Neurological:     General: No focal deficit present.     Mental Status: She is alert and oriented to person, place, and time.    ED Results / Procedures / Treatments   Labs (all labs ordered are listed, but only abnormal results are displayed) Labs Reviewed  RESP PANEL BY RT-PCR (FLU A&B, COVID) [id number]  COMPREHENSIVE METABOLIC PANEL  CBC WITH DIFFERENTIAL/PLATELET  URINALYSIS, ROUTINE W REFLEX MICROSCOPIC  CBG MONITORING, ED    EKG None  Radiology No results found.  Procedures Procedures   Medications Ordered in ED Medications  sodium chloride 0.9 % bolus 1,000 mL (has no administration in time range)  acetaminophen (TYLENOL) tablet 1,000 mg (has no administration in time range)    ED Course  I have reviewed the triage vital signs and the nursing notes.  Pertinent labs & imaging results that were available during my care of the patient  were reviewed by me and considered in my medical decision making (see chart for details).    MDM Rules/Calculators/A&P                           MDM  MSE complete  Jill Montoya was evaluated in Emergency Department on 09/20/2021 for the symptoms described in the history of present illness. She was evaluated in the context of the global COVID-19 pandemic,  which necessitated consideration that the patient might be at risk for infection with the SARS-CoV-2 virus that causes COVID-19. Institutional protocols and algorithms that pertain to the evaluation of patients at risk for COVID-19 are in a state of rapid change based on information released by regulatory bodies including the CDC and federal and state organizations. These policies and algorithms were followed during the patient's care in the ED.  Patient is presenting with symptoms of viral URI.  Influenza test is positive.  Patient without dyspnea or noted hypoxia.  Screening labs otherwise obtained are without significant abnormality.  Fetal heart rate is 148.  Patient does feel improved after administration of IV fluids and acetaminophen.  She does have established primary care and primary OB care.  She has plan to follow-up with them on Monday of this week.  She is on day 5 of symptoms.  She is agreeable to course of Tamiflu.    Patient does understand need for close follow-up.  Strict return precautions given and understood.  At time of discharge patient is comfortable and is able to ambulate without difficulty from the ED.   Final Clinical Impression(s) / ED Diagnoses Final diagnoses:  Influenza    Rx / DC Orders ED Discharge Orders          Ordered    oseltamivir (TAMIFLU) 75 MG capsule  Every 12 hours        09/20/21 1750    ondansetron (ZOFRAN ODT) 4 MG disintegrating tablet  Every 8 hours PRN        09/20/21 1750             Wynetta Fines, MD 09/20/21 1752

## 2021-09-20 NOTE — ED Notes (Signed)
Pt given orange juice and graham crackers. Will recheck CBG.

## 2021-09-20 NOTE — Discharge Instructions (Addendum)
Return for any problem.  ?

## 2021-09-22 ENCOUNTER — Other Ambulatory Visit: Payer: Self-pay

## 2021-09-22 ENCOUNTER — Ambulatory Visit (HOSPITAL_BASED_OUTPATIENT_CLINIC_OR_DEPARTMENT_OTHER): Payer: No Typology Code available for payment source | Admitting: Obstetrics and Gynecology

## 2021-09-22 ENCOUNTER — Other Ambulatory Visit: Payer: Self-pay | Admitting: Obstetrics and Gynecology

## 2021-09-22 ENCOUNTER — Ambulatory Visit: Payer: No Typology Code available for payment source | Attending: Obstetrics and Gynecology

## 2021-09-22 ENCOUNTER — Other Ambulatory Visit: Payer: Self-pay | Admitting: *Deleted

## 2021-09-22 ENCOUNTER — Encounter: Payer: Self-pay | Admitting: *Deleted

## 2021-09-22 ENCOUNTER — Ambulatory Visit: Payer: No Typology Code available for payment source | Admitting: *Deleted

## 2021-09-22 VITALS — BP 98/50 | HR 85

## 2021-09-22 DIAGNOSIS — Z363 Encounter for antenatal screening for malformations: Secondary | ICD-10-CM

## 2021-09-22 DIAGNOSIS — Z3A19 19 weeks gestation of pregnancy: Secondary | ICD-10-CM | POA: Diagnosis present

## 2021-09-22 DIAGNOSIS — Z3689 Encounter for other specified antenatal screening: Secondary | ICD-10-CM

## 2021-09-22 DIAGNOSIS — O99212 Obesity complicating pregnancy, second trimester: Secondary | ICD-10-CM

## 2021-09-22 DIAGNOSIS — O09892 Supervision of other high risk pregnancies, second trimester: Secondary | ICD-10-CM | POA: Diagnosis present

## 2021-09-22 DIAGNOSIS — O24112 Pre-existing diabetes mellitus, type 2, in pregnancy, second trimester: Secondary | ICD-10-CM

## 2021-09-22 DIAGNOSIS — O09212 Supervision of pregnancy with history of pre-term labor, second trimester: Secondary | ICD-10-CM

## 2021-09-22 DIAGNOSIS — E119 Type 2 diabetes mellitus without complications: Secondary | ICD-10-CM | POA: Diagnosis not present

## 2021-09-22 NOTE — Progress Notes (Signed)
Maternal-Fetal Medicine   Name: Annelle Behrendt DOB: 1996/09/16 MRN: [id number] Referring Provider: Gerald Leitz, MD   I had the pleasure of seeing Ms. Koran [facility] at [facility]. She is G2 P0101 at 19w 2d gestation and is here for fetal anatomy scan.  Her problems include: -Type 2 diabetes.  Patient has diabetes since 2015 and takes insulin.  She takes Basaglar 12 units at night and Humalog 20/24/20 4 units with meals.  She reports her fasting levels are high between 120 and 140 mg/DL and postprandial levels are in the 120s.  Patient had ophthalmology examination done about a year ago and there is no evidence of proliferative retinopathy.  Her most recent hemoglobin A1c (September 2022) was 6.4%.  -History of preterm delivery.  In August 2019, she had preterm premature rupture of membranes at 33w 5d gestation.  She underwent induction of labor and delivered a female infant weighing 6 pounds 9 ounces at birth.  Her daughter is in good health.  Past medical history: She does not have hypertension or any other chronic medical conditions. Past surgical history: Nil of note. Medications: Prenatal vitamins, low-dose aspirin, insulin. Allergies: Nasonex. Social history: Denies tobacco or drug or alcohol use.  She has been married 2 years and her husband is in good health. Family history: Both parents have diabetes and her father had kidney and pancreatic transplantation.  No history of venous thromboembolism in the family. GYN history: No history of abnormal Pap smears or cervical surgeries.  No history of breast disease.  Ultrasound We performed fetal anatomical survey.  Fetal biometry is consistent with the previously established dates.  Amniotic fluid is normal and good fetal activity seen.  No markers of aneuploidies or fetal structural defects are seen.  Because of a history of preterm delivery, we performed a transvaginal ultrasound to evaluate the cervix.  The cervix measures  3.8 cm, which is within normal range.  The placenta that appears low-lying had resolved after relaxation of the uterus.  Our concerns include Pregestational diabetes -I discussed the importance of good blood glucose control to prevent adverse fetal or neonatal outcomes.  Complications of poorly controlled diabetes include fetal macrosomia leading to birth injuries, stillbirth and neonatal complications including respiratory distress syndrome. -I discussed blood glucose parameters and the importance of diet and exercise in addition to insulin.  I recommended that patient may gradually increase her bedtime Basaglar insulin to control fasting glucose levels. -I counseled her on hypoglycemia and its corrective measures. -I discussed ultrasound protocol and monitoring fetal growth restriction every 4 weeks and starting weekly antenatal testing from [redacted] weeks gestation till delivery. Timing of delivery: In well-controlled diabetes, she can be delivered at [redacted] weeks gestation.  If diabetes not well controlled, early term delivery (37 or 38 weeks) should be considered. I have recommended fetal echocardiography.  History of preterm delivery This is the single most-common cause of recurrent preterm birth. I informed her that studies have shown the beneficial effect of progesterone therapy in reducing the incidence of preterm births in women with history of preterm birth. Women with preterm births before 34 weeks benefited more from progesterone treatment. However, recent trial (PROLONG trial) has shown no reduction in preterm delivery rates from 17-OH progesterone. Based on [facility] recommendations, we still offer progesterone to high-risk pregnant women. Progesterone is not associated with an increased adverse outcome in pregnancy or in the infants. Local side effects including itching and redness can occur.   Recommendations -An appointment was  made for her to return in 4 weeks for completion of fetal  anatomy. -We have requested an appointment for fetal echocardiography [facility], [place]). -Fetal growth assessments every 4 weeks. -Weekly BPP from [redacted] weeks gestation till delivery. -Continue weekly progesterone injections.  Thank you for consultation.  If you have any questions or concerns, please contact me the [facility].  Consultation including face-to-face (more than 50%) counseling 30 minutes.

## 2021-10-07 ENCOUNTER — Other Ambulatory Visit: Payer: Self-pay

## 2021-10-07 ENCOUNTER — Encounter: Payer: Self-pay | Admitting: Endocrinology

## 2021-10-07 MED ORDER — INSULIN LISPRO (1 UNIT DIAL) 100 UNIT/ML (KWIKPEN)
24.0000 [IU] | PEN_INJECTOR | Freq: Three times a day (TID) | SUBCUTANEOUS | 3 refills | Status: DC
Start: 2021-10-07 — End: 2021-12-25

## 2021-10-17 ENCOUNTER — Ambulatory Visit (INDEPENDENT_AMBULATORY_CARE_PROVIDER_SITE_OTHER): Payer: No Typology Code available for payment source | Admitting: Endocrinology

## 2021-10-17 ENCOUNTER — Other Ambulatory Visit (HOSPITAL_COMMUNITY): Payer: Self-pay

## 2021-10-17 ENCOUNTER — Other Ambulatory Visit: Payer: Self-pay

## 2021-10-17 VITALS — BP 110/60 | HR 102 | Ht 64.0 in | Wt 219.2 lb

## 2021-10-17 DIAGNOSIS — O24819 Other pre-existing diabetes mellitus in pregnancy, unspecified trimester: Secondary | ICD-10-CM | POA: Diagnosis not present

## 2021-10-17 DIAGNOSIS — I959 Hypotension, unspecified: Secondary | ICD-10-CM

## 2021-10-17 LAB — POCT GLYCOSYLATED HEMOGLOBIN (HGB A1C): Hemoglobin A1C: 6.1 % — AB (ref 4.0–5.6)

## 2021-10-17 MED ORDER — DEXCOM G6 SENSOR MISC: 1.0000 | 3 refills | Status: DC

## 2021-10-17 MED ORDER — DEXCOM G6 TRANSMITTER MISC: 3 refills | Status: DC

## 2021-10-17 MED ORDER — BASAGLAR KWIKPEN 100 UNIT/ML ~~LOC~~ SOPN: 16.0000 [IU] | PEN_INJECTOR | Freq: Every day | SUBCUTANEOUS | 3 refills | Status: DC

## 2021-10-17 MED ORDER — DEXCOM G6 RECEIVER DEVI: 1.0000 | Freq: Once | 1 refills | Status: AC

## 2021-10-17 NOTE — Progress Notes (Signed)
Subjective:    Patient ID: Jill Montoya, female    DOB: 10-18-1996, 25 y.o.   MRN: [id number]  HPI Pt returns for f/u of diabetes mellitus:  DM type: Insulin-requiring type 2 Dx'ed: 2015 Complications: none Therapy: insulin since 2019 pregnancy.  GDM: never DKA: never Severe hypoglycemia: never Pancreatitis: never Pancreatic imaging: never Other: she takes multiple daily injections; She did not tolerate Trulicity (burning at inject site).   Interval history: pt states she feels well in general.  Pt says she never misses the insulin.  She is now at 23 weeks of pregnancy. She seldom has hypoglycemia, and these episodes are mild.  She says she still has trouble obtaining continuous glucose monitor sensors.  no cbg record, but states cbg varies from 56-160.  It is still in general highest fasting, but there is little trend throughout the day.   Past Medical History:  Diagnosis Date   Chlamydia    Diabetes mellitus without complication (HCC)    HSV (herpes simplex virus) infection     Past Surgical History:  Procedure Laterality Date   NO PAST SURGERIES      Social History   Socioeconomic History   Marital status: Married    Spouse name: Not on file   Number of children: Not on file   Years of education: Not on file   Highest education level: Not on file  Occupational History   Not on file  Tobacco Use   Smoking status: Never   Smokeless tobacco: Never  Vaping Use   Vaping Use: Never used  Substance and Sexual Activity   Alcohol use: No   Drug use: Never   Sexual activity: Yes    Birth control/protection: None  Other Topics Concern   Not on file  Social History Narrative   Not on file   Social Determinants of Health   Financial Resource Strain: Not on file  Food Insecurity: Not on file  Transportation Needs: Not on file  Physical Activity: Not on file  Stress: Not on file  Social Connections: Not on file  Intimate Partner Violence: Not on file    Current  Outpatient Medications on File Prior to Visit  Medication Sig Dispense Refill   aspirin EC 81 MG tablet Take 81 mg by mouth daily. Swallow whole.     glucose blood (FREESTYLE LITE) test strip Use to check BS 4x a day 100 each 12   insulin lispro (HUMALOG KWIKPEN) 100 UNIT/ML KwikPen Inject 24 Units into the skin 3 (three) times daily with meals. 75 mL 3   Lancets (FREESTYLE) lancets Use as instructed 100 each 12   ondansetron (ZOFRAN ODT) 4 MG disintegrating tablet Take 1 tablet (4 mg total) by mouth every 8 (eight) hours as needed for nausea or vomiting. 20 tablet 0   oseltamivir (TAMIFLU) 75 MG capsule Take 1 capsule (75 mg total) by mouth every 12 (twelve) hours. 10 capsule 0   Prenatal Vit-Fe Fumarate-FA (PRENATAL MULTIVITAMIN) TABS tablet Take 1 tablet by mouth daily at 12 noon.     progesterone (PROMETRIUM) 200 MG capsule Take 200 mg by mouth daily.     valACYclovir (VALTREX) 500 MG tablet Take 500 mg by mouth daily.     No current facility-administered medications on file prior to visit.    Allergies  Allergen Reactions   Nasonex [Mometasone Furoate]     "My nasal passages close up"    Family History  Problem Relation Age of Onset   Diabetes Mother  Diabetes Father    Aneurysm Paternal Grandmother     BP 110/60   Pulse (!) 102   Ht 5\' 4"  (1.626 m)   Wt 219 lb 3.2 oz (99.4 kg)   LMP 05/10/2021   SpO2 98%   BMI 37.63 kg/m    Review of Systems     Objective:   Physical Exam    Lab Results  Component Value Date   HGBA1C 6.1 (A) 10/17/2021      Assessment & Plan:  Insulin-requiring type 2 DM, during pregnancy: uncontrolled  Patient Instructions  check your blood sugar 4 times a day: before the 3 meals, and at bedtime.  also check if you have symptoms of your blood sugar being too high or too low.  please keep a record of the readings and bring it to your next appointment here (or you can bring the meter itself).  You can write it on any piece of paper.   please call 14/12/2020 sooner if your blood sugar goes below 70, or if you have a lot of readings over 200.   Please continue the same Humalog.   I have sent a prescription to your pharmacy, to increase the Basaglar to 16 units at bedtime.   I has asked our prior auth team to ask where to get the continuous glucose monitor sensors.  Please come back for a follow-up appointment in 1 month.

## 2021-10-17 NOTE — Telephone Encounter (Signed)
Our prior Berkley Harvey says you can get the sensors at [facility], so I have sent a prescription.  I'll see you next time.

## 2021-10-17 NOTE — Telephone Encounter (Signed)
-----   Message from Eustaquio Maize, CPhT sent at 10/17/2021 10:44 AM EST ----- The Omnipod G5 sensors are covered by pts. ins.  $0 copay.  She can fill at her retail pharmacy.  They may have to order it. ----- Message ----- From: Romero Belling, MD Sent: 10/17/2021   8:16 AM EST To: Rx Prior Auth Team  Pt needs to know where she can get her continuous glucose monitor sensors filled (any brand is OK with me).  TY

## 2021-10-17 NOTE — Patient Instructions (Addendum)
check your blood sugar 4 times a day: before the 3 meals, and at bedtime.  also check if you have symptoms of your blood sugar being too high or too low.  please keep a record of the readings and bring it to your next appointment here (or you can bring the meter itself).  You can write it on any piece of paper.  please call us sooner if your blood sugar goes below 70, or if you have a lot of readings over 200.   Please continue the same Humalog.   I have sent a prescription to your pharmacy, to increase the Basaglar to 16 units at bedtime.   I has asked our prior auth team to ask where to get the continuous glucose monitor sensors.  Please come back for a follow-up appointment in 1 month.

## 2021-10-19 ENCOUNTER — Other Ambulatory Visit: Payer: Self-pay

## 2021-10-19 ENCOUNTER — Inpatient Hospital Stay (HOSPITAL_COMMUNITY)
Admission: AD | Admit: 2021-10-19 | Discharge: 2021-10-19 | Disposition: A | Discharge status: Home / Self Care | Payer: No Typology Code available for payment source | Attending: Obstetrics and Gynecology | Admitting: Obstetrics and Gynecology

## 2021-10-19 ENCOUNTER — Encounter (HOSPITAL_COMMUNITY): Payer: Self-pay | Admitting: Obstetrics and Gynecology

## 2021-10-19 DIAGNOSIS — Z3A23 23 weeks gestation of pregnancy: Secondary | ICD-10-CM | POA: Insufficient documentation

## 2021-10-19 DIAGNOSIS — Z3689 Encounter for other specified antenatal screening: Secondary | ICD-10-CM | POA: Insufficient documentation

## 2021-10-19 DIAGNOSIS — O09292 Supervision of pregnancy with other poor reproductive or obstetric history, second trimester: Secondary | ICD-10-CM | POA: Diagnosis not present

## 2021-10-19 DIAGNOSIS — O368120 Decreased fetal movements, second trimester, not applicable or unspecified: Secondary | ICD-10-CM | POA: Insufficient documentation

## 2021-10-19 DIAGNOSIS — O368190 Decreased fetal movements, unspecified trimester, not applicable or unspecified: Secondary | ICD-10-CM

## 2021-10-19 NOTE — MAU Provider Note (Signed)
History     CSN: [id number]  Arrival date and time: 10/19/21 0854   Event Date/Time   First Provider Initiated Contact with Patient 10/19/21 [phone]      Chief Complaint  Patient presents with   Decreased Fetal Movement   HPI  Ms.Jill Montoya is a 25 y.o. female [phone] @ [redacted]w[redacted]d here in MAU with complaints of decreased fetal movement. She feels the movements have been less recently. She has a history of fetal loss and wanted to make sure everything was ok.  She is feeling fetal movement now, and feels like the movement became more on her way to the hospital. She has no pain or bleeding.   OB History     Gravida  3   Para  1   Term      Preterm  1   AB  1   Living  1      SAB  1   IAB      Ectopic      Multiple      Live Births              Past Medical History:  Diagnosis Date   Chlamydia    Diabetes mellitus without complication (HCC)    HSV (herpes simplex virus) infection     Past Surgical History:  Procedure Laterality Date   NO PAST SURGERIES      Family History  Problem Relation Age of Onset   Diabetes Mother    Diabetes Father    Aneurysm Paternal Grandmother     Social History   Tobacco Use   Smoking status: Never   Smokeless tobacco: Never  Vaping Use   Vaping Use: Never used  Substance Use Topics   Alcohol use: No   Drug use: Never    Allergies:  Allergies  Allergen Reactions   Nasonex [Mometasone Furoate]     "My nasal passages close up"    Medications Prior to Admission  Medication Sig Dispense Refill Last Dose   aspirin EC 81 MG tablet Take 81 mg by mouth daily. Swallow whole.   10/19/2021   Insulin Glargine (BASAGLAR KWIKPEN) 100 UNIT/ML Inject 16 Units into the skin at bedtime. 15 mL 3 10/18/2021   insulin lispro (HUMALOG KWIKPEN) 100 UNIT/ML KwikPen Inject 24 Units into the skin 3 (three) times daily with meals. 75 mL 3 10/18/2021   Prenatal Vit-Fe Fumarate-FA (PRENATAL MULTIVITAMIN) TABS tablet Take 1 tablet by  mouth daily at 12 noon.   10/19/2021   progesterone (PROMETRIUM) 200 MG capsule Take 200 mg by mouth daily.   10/19/2021   valACYclovir (VALTREX) 500 MG tablet Take 500 mg by mouth daily.   Past Week   Continuous Blood Gluc Sensor (DEXCOM G6 SENSOR) MISC 1 Device by Does not apply route See admin instructions. change every 10 days 9 each 3    Continuous Blood Gluc Transmit (DEXCOM G6 TRANSMITTER) MISC Change every 3 mos 1 each 3    glucose blood (FREESTYLE LITE) test strip Use to check BS 4x a day 100 each 12    Lancets (FREESTYLE) lancets Use as instructed 100 each 12    ondansetron (ZOFRAN ODT) 4 MG disintegrating tablet Take 1 tablet (4 mg total) by mouth every 8 (eight) hours as needed for nausea or vomiting. 20 tablet 0 Unknown   oseltamivir (TAMIFLU) 75 MG capsule Take 1 capsule (75 mg total) by mouth every 12 (twelve) hours. 10 capsule 0 Unknown   No results found for  this or any previous visit (from the past 48 hour(s)).   Review of Systems  Constitutional:  Negative for fever.  Gastrointestinal:  Negative for abdominal pain.  Genitourinary:  Negative for vaginal bleeding and vaginal discharge.  Physical Exam   Blood pressure 124/76, pulse 99, temperature 98.1 F (36.7 C), temperature source Oral, resp. rate 18, height 5\' 4"  (1.626 m), weight 101.1 kg, last menstrual period 05/10/2021, SpO2 99 %, unknown if currently breastfeeding.  Physical Exam Constitutional:      General: She is not in acute distress.    Appearance: Normal appearance. She is not ill-appearing or diaphoretic.  Pulmonary:     Effort: Pulmonary effort is normal.  Musculoskeletal:        General: Normal range of motion.  Skin:    General: Skin is warm.  Neurological:     Mental Status: She is alert and oriented to person, place, and time.  Psychiatric:        Behavior: Behavior normal.   Fetal Tracing: Baseline: 135 bpm Variability: Moderate  Accelerations: 10x10 Decelerations: variables  Toco:  None  MAU Course  Procedures  MDM  Reactive NST, patient feels reassured.   Assessment and Plan   A:  1. Decreased fetal movement during pregnancy, antepartum, single or unspecified fetus   2. [redacted] weeks gestation of pregnancy      P:  Discharge home F/u with OB Return to MAU as needed  05/12/2021 I, NP 10/19/2021 11:08 AM

## 2021-10-19 NOTE — MAU Note (Signed)
Patient came into MAU from home at [redacted]w[redacted]d gestation with c/o decreased fetal movement. Patient does report feeling fetal movement on the ride to the hospital. Patient was concerned because she states baby is usually very active. Patient denies having any LOF or vaginal bleeding. Patient denies having any contractions.

## 2021-10-21 ENCOUNTER — Other Ambulatory Visit: Payer: Self-pay | Admitting: *Deleted

## 2021-10-21 ENCOUNTER — Other Ambulatory Visit: Payer: Self-pay

## 2021-10-21 ENCOUNTER — Ambulatory Visit: Payer: No Typology Code available for payment source | Attending: Obstetrics and Gynecology

## 2021-10-21 ENCOUNTER — Ambulatory Visit: Payer: No Typology Code available for payment source | Admitting: *Deleted

## 2021-10-21 ENCOUNTER — Encounter: Payer: Self-pay | Admitting: *Deleted

## 2021-10-21 VITALS — BP 108/55 | HR 91

## 2021-10-21 DIAGNOSIS — O24112 Pre-existing diabetes mellitus, type 2, in pregnancy, second trimester: Secondary | ICD-10-CM

## 2021-10-21 DIAGNOSIS — Z3A23 23 weeks gestation of pregnancy: Secondary | ICD-10-CM

## 2021-10-21 DIAGNOSIS — E119 Type 2 diabetes mellitus without complications: Secondary | ICD-10-CM

## 2021-10-21 DIAGNOSIS — O24912 Unspecified diabetes mellitus in pregnancy, second trimester: Secondary | ICD-10-CM

## 2021-10-24 ENCOUNTER — Ambulatory Visit: Payer: No Typology Code available for payment source | Admitting: Dietician

## 2021-10-30 ENCOUNTER — Encounter: Payer: PRIVATE HEALTH INSURANCE | Attending: Endocrinology | Admitting: Dietician

## 2021-10-30 DIAGNOSIS — O24819 Other pre-existing diabetes mellitus in pregnancy, unspecified trimester: Secondary | ICD-10-CM | POA: Insufficient documentation

## 2021-11-16 NOTE — L&D Delivery Note (Signed)
Vaginal Delivery Note ? ?Patient pushed for one hour after she was noted to be C/C/+1. Guided pushing with maternal urge and regular contractions. ?At 11:04 a delivery of a viable and healthy  was delivered over right Mediolateral episiotomy via LOA presentation.  ?After head was delivered anterior shoulder and body easily delivered.  Baby laid on maternal abdomen, dried with bulb suction and tactile stimulation.  ?Baby noted to have a vigorous cry and moving all four extremities. Delayed cord clamping done and cord cut by father. Venous cord gases and cord blood obtained.  ?  ?Neonate handed to NICU team for evaluation and assessment due to maternal uncontrolled diabetes, and preterm status.  ? ?Placenta spontaneously delivered intact with trailing membranes,   ? ?Uterine atony was noted and IV TXA x one dose given, also methergine 0.2mg  IM x 1 dose administered.  IV pitocin was running and massage was performed. Hemostasis was finally achieved with final qBL of 864 ?Second degree episiotomy repaired in routine fashion with 2-0 vicryl.  ?Patient tolerated delivery well, there were no complications.  ? ? ?Delivery Details: ?Delivery Type: NSVD  ?Anesthesia Epidural  ?Episiotomy:  Right mediolateral  ?Lacerations:  None  ?Repair suture:  2-0 Vicryl  ?Blood loss (ml):  864  ? ?Birth information: ?Date of birth:  [birth year]  ?Time of birth:  11:04  ?Sex: Information for the patient's newborn:  Coumba, Kellison [[id number]]  ?female    ?Name:  "Ellard Artis"  ?APGAR APGAR (1 MIN): 8   ?APGAR (5 MINS): 9   ?APGAR (10 MINS):    ?Weight    ? ?Resuscitation:   ?Cord information:    Blood gases sent?   Y ?Complications:    PPH  ?Placenta: Delivered: Intact, spontaneously       appearance: Normal 3 vessels ? ? ?Disposition: ?Mom to postpartum.  Baby to Couplet care / Skin to Skin. ? ?Essie Hart MD ?01/18/2022, 12:26 PM ? ?   ? ?  ? ? ? ?

## 2021-11-20 ENCOUNTER — Ambulatory Visit (INDEPENDENT_AMBULATORY_CARE_PROVIDER_SITE_OTHER): Payer: Medicaid Other | Admitting: Endocrinology

## 2021-11-20 ENCOUNTER — Ambulatory Visit: Payer: PRIVATE HEALTH INSURANCE | Attending: Obstetrics and Gynecology

## 2021-11-20 ENCOUNTER — Other Ambulatory Visit: Payer: Self-pay

## 2021-11-20 ENCOUNTER — Ambulatory Visit: Payer: PRIVATE HEALTH INSURANCE | Admitting: *Deleted

## 2021-11-20 VITALS — BP 110/63 | HR 92

## 2021-11-20 VITALS — BP 110/52 | HR 111 | Ht 64.0 in | Wt 223.8 lb

## 2021-11-20 DIAGNOSIS — O24912 Unspecified diabetes mellitus in pregnancy, second trimester: Secondary | ICD-10-CM | POA: Insufficient documentation

## 2021-11-20 DIAGNOSIS — O24112 Pre-existing diabetes mellitus, type 2, in pregnancy, second trimester: Secondary | ICD-10-CM | POA: Diagnosis not present

## 2021-11-20 DIAGNOSIS — O24819 Other pre-existing diabetes mellitus in pregnancy, unspecified trimester: Secondary | ICD-10-CM

## 2021-11-20 DIAGNOSIS — Z3A27 27 weeks gestation of pregnancy: Secondary | ICD-10-CM

## 2021-11-20 DIAGNOSIS — O99212 Obesity complicating pregnancy, second trimester: Secondary | ICD-10-CM

## 2021-11-20 DIAGNOSIS — E119 Type 2 diabetes mellitus without complications: Secondary | ICD-10-CM | POA: Diagnosis not present

## 2021-11-20 DIAGNOSIS — O09292 Supervision of pregnancy with other poor reproductive or obstetric history, second trimester: Secondary | ICD-10-CM

## 2021-11-20 DIAGNOSIS — Z9114 Patient's other noncompliance with medication regimen: Secondary | ICD-10-CM | POA: Diagnosis not present

## 2021-11-20 DIAGNOSIS — E669 Obesity, unspecified: Secondary | ICD-10-CM

## 2021-11-20 MED ORDER — BASAGLAR KWIKPEN 100 UNIT/ML ~~LOC~~ SOPN: 16.0000 [IU] | PEN_INJECTOR | Freq: Every day | SUBCUTANEOUS | 3 refills | Status: DC

## 2021-11-20 NOTE — Patient Instructions (Addendum)
check your blood sugar 4 times a day: before the 3 meals, and at bedtime.  also check if you have symptoms of your blood sugar being too high or too low.  please keep a record of the readings and bring it to your next appointment here (or you can bring the meter itself).  You can write it on any piece of paper.  please call us sooner if your blood sugar goes below 70, or if you have a lot of readings over 200.   Please continue the same 2 insulins.   Please change the Basaglar to before supper, to help you remember it.   Please come back for a follow-up appointment in 1 month.

## 2021-11-20 NOTE — Progress Notes (Signed)
Subjective:    Patient ID: Jill Montoya, female    DOB: 1995/12/10, 26 y.o.   MRN: [id number]  HPI Pt returns for f/u of diabetes mellitus:  DM type: Insulin-requiring type 2 Dx'ed: 2015 Complications: none Therapy: insulin since 2019 pregnancy.   GDM: never DKA: never Severe hypoglycemia: never Pancreatitis: never Pancreatic imaging: never SDOH: ins declined continuous glucose monitor.   Other: she takes multiple daily injections; She did not tolerate Trulicity (burning at inject site).   Interval history: pt states she feels well in general.  Pt says she never misses the insulin.  She is now at 27 weeks of pregnancy. She seldom has hypoglycemia, and these episodes are mild.   she brings a record of her cbg's which I have reviewed today.  Cbg varies from 43-157.  It is still in general highest fasting, but there is little trend throughout the day.  She sometimes misses basaglar.   Past Medical History:  Diagnosis Date   Chlamydia    Diabetes mellitus without complication (HCC)    HSV (herpes simplex virus) infection     Past Surgical History:  Procedure Laterality Date   NO PAST SURGERIES      Social History   Socioeconomic History   Marital status: Married    Spouse name: Not on file   Number of children: Not on file   Years of education: Not on file   Highest education level: Not on file  Occupational History   Not on file  Tobacco Use   Smoking status: Never   Smokeless tobacco: Never  Vaping Use   Vaping Use: Never used  Substance and Sexual Activity   Alcohol use: No   Drug use: Never   Sexual activity: Yes    Birth control/protection: None  Other Topics Concern   Not on file  Social History Narrative   Not on file   Social Determinants of Health   Financial Resource Strain: Not on file  Food Insecurity: Not on file  Transportation Needs: Not on file  Physical Activity: Not on file  Stress: Not on file  Social Connections: Not on file  Intimate  Partner Violence: Not on file    Current Outpatient Medications on File Prior to Visit  Medication Sig Dispense Refill   ALBUTEROL IN Inhale into the lungs.     aspirin EC 81 MG tablet Take 81 mg by mouth daily. Swallow whole.     glucose blood (FREESTYLE LITE) test strip Use to check BS 4x a day 100 each 12   insulin lispro (HUMALOG KWIKPEN) 100 UNIT/ML KwikPen Inject 24 Units into the skin 3 (three) times daily with meals. 75 mL 3   Lancets (FREESTYLE) lancets Use as instructed 100 each 12   Prenatal Vit-Fe Fumarate-FA (PRENATAL MULTIVITAMIN) TABS tablet Take 1 tablet by mouth daily at 12 noon.     valACYclovir (VALTREX) 500 MG tablet Take 500 mg by mouth daily.     Continuous Blood Gluc Sensor (DEXCOM G6 SENSOR) MISC 1 Device by Does not apply route See admin instructions. change every 10 days 9 each 3   Continuous Blood Gluc Transmit (DEXCOM G6 TRANSMITTER) MISC Change every 3 mos 1 each 3   No current facility-administered medications on file prior to visit.      Family History  Problem Relation Age of Onset   Diabetes Mother    Diabetes Father    Aneurysm Paternal Grandmother     BP (!) 110/52    Pulse [name] Kitchen)  111    Ht 5\' 4"  (1.626 m)    Wt 223 lb 12.8 oz (101.5 kg)    LMP 05/10/2021    SpO2 97%    BMI 38.42 kg/m    Review of Systems Denies N/V    Objective:   Physical Exam VITAL SIGNS:  See vs page GENERAL: no distress.       Assessment & Plan:  Insulin-requiring type 2 DM, during pregnancy Noncompliance with insulin Hypoglycemia, due to insulin: this limits aggressiveness of glycemic control  Patient Instructions  check your blood sugar 4 times a day: before the 3 meals, and at bedtime.  also check if you have symptoms of your blood sugar being too high or too low.  please keep a record of the readings and bring it to your next appointment here (or you can bring the meter itself).  You can write it on any piece of paper.  please call 05/12/2021 sooner if your blood sugar  goes below 70, or if you have a lot of readings over 200.   Please continue the same 2 insulins.   Please change the Basaglar to before supper, to help you remember it.   Please come back for a follow-up appointment in 1 month.

## 2021-11-21 ENCOUNTER — Other Ambulatory Visit: Payer: Self-pay | Admitting: *Deleted

## 2021-11-21 DIAGNOSIS — O24112 Pre-existing diabetes mellitus, type 2, in pregnancy, second trimester: Secondary | ICD-10-CM

## 2021-12-01 ENCOUNTER — Other Ambulatory Visit (HOSPITAL_COMMUNITY): Payer: Self-pay

## 2021-12-19 ENCOUNTER — Ambulatory Visit: Payer: PRIVATE HEALTH INSURANCE | Attending: Obstetrics

## 2021-12-19 ENCOUNTER — Ambulatory Visit: Payer: PRIVATE HEALTH INSURANCE | Admitting: *Deleted

## 2021-12-19 ENCOUNTER — Other Ambulatory Visit: Payer: Self-pay

## 2021-12-19 VITALS — BP 123/63 | HR 101

## 2021-12-19 DIAGNOSIS — O24112 Pre-existing diabetes mellitus, type 2, in pregnancy, second trimester: Secondary | ICD-10-CM | POA: Diagnosis not present

## 2021-12-19 DIAGNOSIS — O24113 Pre-existing diabetes mellitus, type 2, in pregnancy, third trimester: Secondary | ICD-10-CM | POA: Insufficient documentation

## 2021-12-19 DIAGNOSIS — E119 Type 2 diabetes mellitus without complications: Secondary | ICD-10-CM

## 2021-12-19 DIAGNOSIS — Z3A31 31 weeks gestation of pregnancy: Secondary | ICD-10-CM

## 2021-12-22 ENCOUNTER — Other Ambulatory Visit: Payer: Self-pay | Admitting: *Deleted

## 2021-12-22 DIAGNOSIS — O24113 Pre-existing diabetes mellitus, type 2, in pregnancy, third trimester: Secondary | ICD-10-CM

## 2021-12-25 ENCOUNTER — Other Ambulatory Visit: Payer: Self-pay

## 2021-12-25 ENCOUNTER — Ambulatory Visit (INDEPENDENT_AMBULATORY_CARE_PROVIDER_SITE_OTHER): Payer: PRIVATE HEALTH INSURANCE | Admitting: Endocrinology

## 2021-12-25 VITALS — BP 130/80 | HR 118 | Ht 64.0 in | Wt 225.0 lb

## 2021-12-25 DIAGNOSIS — O24819 Other pre-existing diabetes mellitus in pregnancy, unspecified trimester: Secondary | ICD-10-CM | POA: Diagnosis not present

## 2021-12-25 LAB — POCT GLYCOSYLATED HEMOGLOBIN (HGB A1C): Hemoglobin A1C: 6.9 % — AB (ref 4.0–5.6)

## 2021-12-25 MED ORDER — INSULIN LISPRO (1 UNIT DIAL) 100 UNIT/ML (KWIKPEN): 26.0000 [IU] | PEN_INJECTOR | Freq: Three times a day (TID) | SUBCUTANEOUS | 3 refills | Status: DC

## 2021-12-25 MED ORDER — BASAGLAR KWIKPEN 100 UNIT/ML ~~LOC~~ SOPN: 20.0000 [IU] | PEN_INJECTOR | Freq: Every day | SUBCUTANEOUS | 3 refills | Status: DC

## 2021-12-25 NOTE — Progress Notes (Signed)
Subjective:    Patient ID: Jill Montoya, female    DOB: May 16, 1996, 26 y.o.   MRN: [id number]  HPI Pt returns for f/u of diabetes mellitus:  DM type: Insulin-requiring type 2 Dx'ed: 2015 Complications: none Therapy: insulin since 2019 pregnancy.   GDM: never DKA: never Severe hypoglycemia: never Pancreatitis: never Pancreatic imaging: never SDOH: ins declined continuous glucose monitor.   Other: she takes multiple daily injections; She did not tolerate Trulicity (burning at inject site).   Interval history: pt states she feels well in general.  She is now at 32 weeks of pregnancy. She seldom has hypoglycemia, and these episodes are mild.   she brings a record of her cbg's which I have reviewed today.  Cbg varies from 76-192.  there is little trend throughout the day.  Pt says she never misses the insulin.  Pt says plan is for induction at 37 weeks, due to macrosomia.   Past Medical History:  Diagnosis Date   Chlamydia    Diabetes mellitus without complication (HCC)    HSV (herpes simplex virus) infection     Past Surgical History:  Procedure Laterality Date   NO PAST SURGERIES      Social History   Socioeconomic History   Marital status: Married    Spouse name: Not on file   Number of children: Not on file   Years of education: Not on file   Highest education level: Not on file  Occupational History   Not on file  Tobacco Use   Smoking status: Never   Smokeless tobacco: Never  Vaping Use   Vaping Use: Never used  Substance and Sexual Activity   Alcohol use: No   Drug use: Never   Sexual activity: Yes    Birth control/protection: None  Other Topics Concern   Not on file  Social History Narrative   Not on file   Social Determinants of Health   Financial Resource Strain: Not on file  Food Insecurity: Not on file  Transportation Needs: Not on file  Physical Activity: Not on file  Stress: Not on file  Social Connections: Not on file  Intimate Partner  Violence: Not on file    Current Outpatient Medications on File Prior to Visit  Medication Sig Dispense Refill   ALBUTEROL IN Inhale into the lungs.     aspirin EC 81 MG tablet Take 81 mg by mouth daily. Swallow whole.     glucose blood (FREESTYLE LITE) test strip Use to check BS 4x a day 100 each 12   Lancets (FREESTYLE) lancets Use as instructed 100 each 12   Prenatal Vit-Fe Fumarate-FA (PRENATAL MULTIVITAMIN) TABS tablet Take 1 tablet by mouth daily at 12 noon.     valACYclovir (VALTREX) 500 MG tablet Take 500 mg by mouth daily.     Continuous Blood Gluc Sensor (DEXCOM G6 SENSOR) MISC 1 Device by Does not apply route See admin instructions. change every 10 days 9 each 3   Continuous Blood Gluc Transmit (DEXCOM G6 TRANSMITTER) MISC Change every 3 mos 1 each 3   No current facility-administered medications on file prior to visit.      Family History  Problem Relation Age of Onset   Diabetes Mother    Diabetes Father    Aneurysm Paternal Grandmother     BP 130/80    Pulse (!) 118    Ht 5\' 4"  (1.626 m)    Wt 225 lb (102.1 kg)    LMP 05/10/2021  SpO2 97%    BMI 38.62 kg/m    Review of Systems     Objective:   Physical Exam  Lab Results  Component Value Date   TSH 1.10 09/12/2021    Lab Results  Component Value Date   HGBA1C 6.9 (A) 12/25/2021      Assessment & Plan:  Insulin-requiring type 2 DM: she needs increased rx.  Patient Instructions  check your blood sugar 4 times a day: before the 3 meals, and at bedtime.  also check if you have symptoms of your blood sugar being too high or too low.  please keep a record of the readings and bring it to your next appointment here (or you can bring the meter itself).  You can write it on any piece of paper.  please call us sooner if your blood sugar goes below 70, or if you have a lot of readings over 200.   Please increase the 2 insulins to the numbers listed below.  Please come back for a follow-up appointment in 2 weeks.

## 2021-12-25 NOTE — Patient Instructions (Signed)
check your blood sugar 4 times a day: before the 3 meals, and at bedtime.  also check if you have symptoms of your blood sugar being too high or too low.  please keep a record of the readings and bring it to your next appointment here (or you can bring the meter itself).  You can write it on any piece of paper.  please call us sooner if your blood sugar goes below 70, or if you have a lot of readings over 200.   Please increase the 2 insulins to the numbers listed below.  Please come back for a follow-up appointment in 2 weeks.

## 2021-12-26 ENCOUNTER — Ambulatory Visit: Payer: PRIVATE HEALTH INSURANCE | Attending: Obstetrics

## 2021-12-26 ENCOUNTER — Ambulatory Visit: Payer: PRIVATE HEALTH INSURANCE | Admitting: *Deleted

## 2021-12-26 ENCOUNTER — Encounter: Payer: Self-pay | Admitting: *Deleted

## 2021-12-26 VITALS — BP 100/60 | HR 102

## 2021-12-26 DIAGNOSIS — Z3A32 32 weeks gestation of pregnancy: Secondary | ICD-10-CM

## 2021-12-26 DIAGNOSIS — O09293 Supervision of pregnancy with other poor reproductive or obstetric history, third trimester: Secondary | ICD-10-CM

## 2021-12-26 DIAGNOSIS — E119 Type 2 diabetes mellitus without complications: Secondary | ICD-10-CM

## 2021-12-26 DIAGNOSIS — O24113 Pre-existing diabetes mellitus, type 2, in pregnancy, third trimester: Secondary | ICD-10-CM

## 2021-12-26 DIAGNOSIS — O24112 Pre-existing diabetes mellitus, type 2, in pregnancy, second trimester: Secondary | ICD-10-CM | POA: Insufficient documentation

## 2021-12-26 NOTE — Procedures (Signed)
Jill Montoya Dec 06, 1995 [redacted]w[redacted]d  Fetus A Non-Stress Test Interpretation for 12/26/21  Indication: Diabetes  Fetal Heart Rate A Mode: External Baseline Rate (A): 150 bpm Variability: Moderate Accelerations: 15 x 15 Decelerations: None  Uterine Activity Mode: Toco Contraction Frequency (min): UI Resting Tone Palpated: Relaxed  Interpretation (Fetal Testing) Nonstress Test Interpretation: Reactive Overall Impression: Reassuring for gestational age Comments: tracing reviewed by Dr. Judeth Cornfield

## 2021-12-29 ENCOUNTER — Other Ambulatory Visit: Payer: Self-pay | Admitting: Obstetrics

## 2021-12-29 DIAGNOSIS — O24112 Pre-existing diabetes mellitus, type 2, in pregnancy, second trimester: Secondary | ICD-10-CM

## 2022-01-02 ENCOUNTER — Other Ambulatory Visit: Payer: Self-pay

## 2022-01-02 ENCOUNTER — Ambulatory Visit: Payer: PRIVATE HEALTH INSURANCE | Admitting: *Deleted

## 2022-01-02 ENCOUNTER — Ambulatory Visit: Payer: PRIVATE HEALTH INSURANCE | Attending: Maternal & Fetal Medicine

## 2022-01-02 ENCOUNTER — Other Ambulatory Visit: Payer: Self-pay | Admitting: Maternal & Fetal Medicine

## 2022-01-02 VITALS — BP 128/68 | HR 104

## 2022-01-02 DIAGNOSIS — O24113 Pre-existing diabetes mellitus, type 2, in pregnancy, third trimester: Secondary | ICD-10-CM

## 2022-01-02 DIAGNOSIS — E119 Type 2 diabetes mellitus without complications: Secondary | ICD-10-CM | POA: Diagnosis not present

## 2022-01-02 DIAGNOSIS — O09213 Supervision of pregnancy with history of pre-term labor, third trimester: Secondary | ICD-10-CM | POA: Diagnosis not present

## 2022-01-02 DIAGNOSIS — Z3A33 33 weeks gestation of pregnancy: Secondary | ICD-10-CM

## 2022-01-02 DIAGNOSIS — O24414 Gestational diabetes mellitus in pregnancy, insulin controlled: Secondary | ICD-10-CM | POA: Insufficient documentation

## 2022-01-02 NOTE — Procedures (Signed)
Jill Montoya 29-Jun-1996 [redacted]w[redacted]d  Fetus A Non-Stress Test Interpretation for 01/02/22  Indication: Diabetes-insulin  Fetal Heart Rate A Mode: External Baseline Rate (A): 145 bpm Variability: Moderate Accelerations: 15 x 15 Decelerations: None Multiple birth?: No  Uterine Activity Mode: Palpation, Toco Contraction Frequency (min): none Resting Tone Palpated: Relaxed  Interpretation (Fetal Testing) Nonstress Test Interpretation: Reactive Overall Impression: Reassuring for gestational age Comments: Dr. Judeth Cornfield reviewed tracing

## 2022-01-09 ENCOUNTER — Observation Stay (HOSPITAL_COMMUNITY)
Admission: AD | Admit: 2022-01-09 | Discharge: 2022-01-10 | Disposition: A | Discharge status: Home / Self Care | Payer: PRIVATE HEALTH INSURANCE | Attending: Obstetrics and Gynecology | Admitting: Obstetrics and Gynecology

## 2022-01-09 ENCOUNTER — Ambulatory Visit (HOSPITAL_BASED_OUTPATIENT_CLINIC_OR_DEPARTMENT_OTHER): Payer: PRIVATE HEALTH INSURANCE | Admitting: *Deleted

## 2022-01-09 ENCOUNTER — Other Ambulatory Visit: Payer: Self-pay

## 2022-01-09 ENCOUNTER — Ambulatory Visit: Payer: PRIVATE HEALTH INSURANCE | Attending: Maternal & Fetal Medicine | Admitting: Maternal & Fetal Medicine

## 2022-01-09 ENCOUNTER — Ambulatory Visit: Payer: PRIVATE HEALTH INSURANCE | Admitting: Endocrinology

## 2022-01-09 ENCOUNTER — Ambulatory Visit: Payer: PRIVATE HEALTH INSURANCE | Admitting: *Deleted

## 2022-01-09 ENCOUNTER — Ambulatory Visit: Payer: PRIVATE HEALTH INSURANCE | Attending: Maternal & Fetal Medicine

## 2022-01-09 VITALS — BP 119/74 | HR 102

## 2022-01-09 DIAGNOSIS — E119 Type 2 diabetes mellitus without complications: Secondary | ICD-10-CM

## 2022-01-09 DIAGNOSIS — O09892 Supervision of other high risk pregnancies, second trimester: Secondary | ICD-10-CM | POA: Insufficient documentation

## 2022-01-09 DIAGNOSIS — Z20822 Contact with and (suspected) exposure to covid-19: Secondary | ICD-10-CM | POA: Insufficient documentation

## 2022-01-09 DIAGNOSIS — O24113 Pre-existing diabetes mellitus, type 2, in pregnancy, third trimester: Secondary | ICD-10-CM | POA: Insufficient documentation

## 2022-01-09 DIAGNOSIS — Z3A34 34 weeks gestation of pregnancy: Secondary | ICD-10-CM | POA: Diagnosis not present

## 2022-01-09 DIAGNOSIS — O479 False labor, unspecified: Secondary | ICD-10-CM | POA: Diagnosis present

## 2022-01-09 DIAGNOSIS — O24913 Unspecified diabetes mellitus in pregnancy, third trimester: Secondary | ICD-10-CM

## 2022-01-09 DIAGNOSIS — O368390 Maternal care for abnormalities of the fetal heart rate or rhythm, unspecified trimester, not applicable or unspecified: Secondary | ICD-10-CM | POA: Diagnosis present

## 2022-01-09 DIAGNOSIS — O1203 Gestational edema, third trimester: Secondary | ICD-10-CM | POA: Insufficient documentation

## 2022-01-09 DIAGNOSIS — Z3A35 35 weeks gestation of pregnancy: Secondary | ICD-10-CM | POA: Diagnosis not present

## 2022-01-09 DIAGNOSIS — O99213 Obesity complicating pregnancy, third trimester: Secondary | ICD-10-CM | POA: Insufficient documentation

## 2022-01-09 DIAGNOSIS — O4703 False labor before 37 completed weeks of gestation, third trimester: Principal | ICD-10-CM | POA: Insufficient documentation

## 2022-01-09 DIAGNOSIS — Z794 Long term (current) use of insulin: Secondary | ICD-10-CM | POA: Diagnosis not present

## 2022-01-09 DIAGNOSIS — O09213 Supervision of pregnancy with history of pre-term labor, third trimester: Secondary | ICD-10-CM | POA: Diagnosis not present

## 2022-01-09 LAB — COMPREHENSIVE METABOLIC PANEL
ALT: 20 U/L (ref 0–44)
AST: 18 U/L (ref 15–41)
Albumin: 2.8 g/dL — ABNORMAL LOW (ref 3.5–5.0)
Alkaline Phosphatase: 55 U/L (ref 38–126)
Anion gap: 8 (ref 5–15)
BUN: 6 mg/dL (ref 6–20)
CO2: 21 mmol/L — ABNORMAL LOW (ref 22–32)
Calcium: 8.8 mg/dL — ABNORMAL LOW (ref 8.9–10.3)
Chloride: 105 mmol/L (ref 98–111)
Creatinine, Ser: 0.59 mg/dL (ref 0.44–1.00)
GFR, Estimated: >60 mL/min (ref >60)
Glucose, Bld: 81 mg/dL (ref 70–99)
Potassium: 3.9 mmol/L (ref 3.5–5.1)
Sodium: 134 mmol/L — ABNORMAL LOW (ref 135–145)
Total Bilirubin: 0.5 mg/dL (ref 0.3–1.2)
Total Protein: 5.9 g/dL — ABNORMAL LOW (ref 6.5–8.1)

## 2022-01-09 LAB — RESP PANEL BY RT-PCR (FLU A&B, COVID) ARPGX2
Influenza A by PCR: NEGATIVE
Influenza B by PCR: NEGATIVE
SARS Coronavirus 2 by RT PCR: NEGATIVE

## 2022-01-09 LAB — CBC
HCT: 34.4 % — ABNORMAL LOW (ref 36.0–46.0)
Hemoglobin: 11.5 g/dL — ABNORMAL LOW (ref 12.0–15.0)
MCH: 30.7 pg (ref 26.0–34.0)
MCHC: 33.4 g/dL (ref 30.0–36.0)
MCV: 92 fL (ref 80.0–100.0)
Platelets: 298 10*3/uL (ref 150–400)
RBC: 3.74 MIL/uL — ABNORMAL LOW (ref 3.87–5.11)
RDW: 13.1 % (ref 11.5–15.5)
WBC: 9.3 10*3/uL (ref 4.0–10.5)
nRBC: 0 % (ref 0.0–0.2)

## 2022-01-09 LAB — OB RESULTS CONSOLE GBS: GBS: NEGATIVE

## 2022-01-09 LAB — PROTEIN / CREATININE RATIO, URINE
Creatinine, Urine: 155.59 mg/dL
Protein Creatinine Ratio: 0.13 mg/mg{Cre} (ref 0.00–0.15)
Total Protein, Urine: 21 mg/dL

## 2022-01-09 LAB — GLUCOSE, CAPILLARY: Glucose-Capillary: 98 mg/dL (ref 70–99)

## 2022-01-09 LAB — LACTATE DEHYDROGENASE: LDH: 110 U/L (ref 98–192)

## 2022-01-09 MED ORDER — INSULIN ASPART 100 UNIT/ML IJ SOLN
26.0000 [IU] | Freq: Three times a day (TID) | INTRAMUSCULAR | Status: DC
  Administered 2022-01-09 – 2022-01-10 (×2): 26 [IU] via SUBCUTANEOUS

## 2022-01-09 MED ORDER — SODIUM CHLORIDE 0.9 % IV SOLN: 250.0000 mL | INTRAVENOUS | Status: DC | PRN

## 2022-01-09 MED ORDER — SODIUM CHLORIDE 0.9% FLUSH: 3.0000 mL | INTRAVENOUS | Status: DC | PRN

## 2022-01-09 MED ORDER — SODIUM CHLORIDE 0.9% FLUSH
3.0000 mL | Freq: Two times a day (BID) | INTRAVENOUS | Status: DC
  Administered 2022-01-09: 3 mL via INTRAVENOUS

## 2022-01-09 MED ORDER — CALCIUM CARBONATE ANTACID 500 MG PO CHEW: 2.0000 | CHEWABLE_TABLET | ORAL | Status: DC | PRN

## 2022-01-09 MED ORDER — ACETAMINOPHEN 325 MG PO TABS: 650.0000 mg | ORAL_TABLET | ORAL | Status: DC | PRN

## 2022-01-09 MED ORDER — PRENATAL MULTIVITAMIN CH
1.0000 | ORAL_TABLET | Freq: Every day | ORAL | Status: DC
  Administered 2022-01-10: 1 via ORAL
  Filled 2022-01-09: qty 1

## 2022-01-09 MED ORDER — INSULIN ASPART 100 UNIT/ML IJ SOLN
0.0000 [IU] | Freq: Three times a day (TID) | INTRAMUSCULAR | Status: DC
  Administered 2022-01-09: 2 [IU] via SUBCUTANEOUS

## 2022-01-09 MED ORDER — VALACYCLOVIR HCL 500 MG PO TABS
1000.0000 mg | ORAL_TABLET | Freq: Every day | ORAL | Status: DC
  Administered 2022-01-09 – 2022-01-10 (×2): 1000 mg via ORAL
  Filled 2022-01-09 (×2): qty 2

## 2022-01-09 MED ORDER — HYDROXYPROGESTERONE CAPROATE 250 MG/ML IM OIL
250.0000 mg | TOPICAL_OIL | Freq: Once | INTRAMUSCULAR | Status: AC
  Administered 2022-01-09: 250 mg via INTRAMUSCULAR
  Filled 2022-01-09: qty 1

## 2022-01-09 MED ORDER — INSULIN GLARGINE-YFGN 100 UNIT/ML ~~LOC~~ SOLN
20.0000 [IU] | Freq: Every day | SUBCUTANEOUS | Status: DC
  Administered 2022-01-09: 20 [IU] via SUBCUTANEOUS
  Filled 2022-01-09 (×2): qty 0.2

## 2022-01-09 MED ORDER — ZOLPIDEM TARTRATE 5 MG PO TABS: 5.0000 mg | ORAL_TABLET | Freq: Every evening | ORAL | Status: DC | PRN

## 2022-01-09 MED ORDER — LACTATED RINGERS IV SOLN: Freq: Once | INTRAVENOUS | Status: AC

## 2022-01-09 MED ORDER — DOCUSATE SODIUM 100 MG PO CAPS: 100.0000 mg | ORAL_CAPSULE | Freq: Every day | ORAL | Status: DC

## 2022-01-09 NOTE — Procedures (Signed)
Jill Montoya 01-31-1996 [redacted]w[redacted]d  Fetus A Non-Stress Test Interpretation for 01/09/22  Indication: Diabetes  Fetal Heart Rate A Mode: External Baseline Rate (A): 150 bpm Variability: Moderate Accelerations: 15 x 15 Decelerations: Variable Multiple birth?: No  Uterine Activity Mode: Palpation, Toco Contraction Frequency (min): 2-5 Contraction Duration (sec): 50-70 Contraction Quality: Mild Resting Tone Palpated: Relaxed Resting Time: Adequate  Interpretation (Fetal Testing) Nonstress Test Interpretation: Reactive Overall Impression: Non-reassuring Comments: Dr. Grace Bushy reviewed tracing. Patient sent to [facility] due to variable decelerations in correlation with contractions.

## 2022-01-09 NOTE — H&P (Signed)
HPI: 26 y.o. [id number] @ [redacted]w[redacted]d estimated gestational age (as dated by LMP c/w 6 week ultrasound) presents for inpatient observation for non-reassuring fetal heart tracing. Patient was seen at [facility] today for routine fetal testing. BPP was 10/10; however, after contractions she had variable to late decelerations and Dr. Grace Bushy recommended inpatient observation.   Patient reports not feeling any significant pain with her first delivery and is unsure what the difference between a Braxton-Hicks contraction and normal contraction feels like.   Leakage of fluid:  No Vaginal bleeding:  No Contractions:  Yes but very mild Fetal movement:  Yes  Prenatal care has been provided by Dr. Gerald Leitz [facility])  ROS:  Denies fevers, chills, chest pain, visual changes, SOB, RUQ/epigastric pain, N/V, dysuria, hematuria, or sudden onset/worsening bilateral LE or facial edema.  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)  Prenatal Transfer Tool  Maternal Diabetes: Yes:  Diabetes Type:  Pre-pregnancy Genetic Screening: Normal Maternal Ultrasounds/Referrals: Other: Fetal Ultrasounds or other Referrals:  Referred to Materal Fetal Medicine  Maternal Substance Abuse:  No Significant Maternal Medications:  Meds include: Other: Insulins as documented above Significant Maternal Lab Results: None   Prenatal Labs Blood type:  B Positive Antibody screen:  Negative CBC:  H/H 11.8/34.6 Rubella: Immune RPR:  Non-reactive Hep B:  Negative Hep C:  Negative HIV:  Negative GC/CT:  Negative Glucola:  N/A  Immunizations: Tdap: Given prenatally   OBHx:  OB History     Gravida  3   Para  1   Term      Preterm  1   AB  1   Living  1      SAB  1   IAB      Ectopic      Multiple      Live Births             PMHx:  See above Meds:  PNV,  Valtrex 1gram daily Allergy:   Allergies  Allergen Reactions   Nasonex [Mometasone Furoate]     "My nasal passages close up"   SurgHx: None SocHx:   Denies Tobacco, ETOH, illicit drugs  O: BP 128/74 (BP Location: Left Arm)    Pulse (!) 108    Temp 98.7 F (37.1 C) (Oral)    Resp 16    LMP 05/10/2021    SpO2 98%  Gen. AAOx3, NAD CV.  RRR  Resp. CTAB, no wheezes/rales/rhonchi Abd. Gravid, soft, non-tender throughout, no rebound/guarding Extr.  Trace bilateral LE edema, no calf tenderness bilaterally Neuro: 2+ bilateral brachioradialis DTRs SVE: 3-4/80/-3, sutures palpated  SSE: No vulvar/vaginal/cervical lesions.  No blood visualized in vaginal vault.  Last [place]:   ----------------------------------------------------------------------  OBSTETRICS REPORT                       (Signed Final 01/09/2022 04:28 pm) ---------------------------------------------------------------------- Patient Info  ID #:       [id number]                          D.O.B.:  [birth year] (25 yrs)  Name:       GEANNIE Gwynne                     Visit Date: 01/09/2022 01:18 pm ---------------------------------------------------------------------- Performed By  Attending:  Corenthian Booker      Ref. Address:     Bailey Mech                    MD                                                             [address]                                                             [address] [place], [place]  Performed By:     Germain Osgood            [facility]                                                             [place] [facility]                                                             [facility]  Referred By:      Christophe Louis  MD ---------------------------------------------------------------------- Orders  #  Description                           Code        Ordered By  1  [place] MFM FETAL BPP WO NON               [id number]    [place]  BOOKER ----------------------------------------------------------------------  #  Order #                     Accession #                Episode #  1  [id number]                   [id number]                 [id number] ---------------------------------------------------------------------- Indications  Pre-existing diabetes, Type 2, in pregnancy,   O24.113  third trimester  Poor obstetric history: Previous preterm       O09.219  delivery, antepartum (34weeks 4 days)  Obesity complicating pregnancy, third          O99.213  trimester  Low Risk NIPS  [redacted] weeks gestation of pregnancy                Z3A.34 ---------------------------------------------------------------------- Fetal Evaluation  Num Of Fetuses:         1  Fetal Heart Rate(bpm):  138  Cardiac Activity:       Observed  Presentation:           Cephalic  Placenta:               Posterior  P. Cord Insertion:      Previously Visualized  Amniotic Fluid  AFI FV:      Within normal limits  AFI Sum(cm)     %Tile       Largest Pocket(cm)  10.55           24          5.23  RUQ(cm)       RLQ(cm)       LUQ(cm)        LLQ(cm)  4.15          0             5.23           1.17 ---------------------------------------------------------------------- Biophysical Evaluation  Amniotic F.V:   Within normal limits       F. Tone:        Observed  F. Movement:    Observed                   N.S.T:          Reactive  F. Breathing:   Observed                   Score:          10/10 ---------------------------------------------------------------------- OB History  Gravidity:    3         Term:   0        Prem:   1        SAB:   1  TOP:          0       Ectopic:  0        Living:  1 ---------------------------------------------------------------------- Gestational Age  LMP:           34w 6d        Date:  05/10/21                 EDD:   02/14/22  Best:          34w 6d     Det. By:  LMP  (05/10/21)  EDD:   02/14/22 ---------------------------------------------------------------------- Anatomy  Ventricles:            Appears normal         Stomach:                Appears normal, left                                                                        sided  Heart:                 Pericardial            Kidneys:                Appear normal                         effusion  Diaphragm:             Appears normal         Bladder:                Appears normal ---------------------------------------------------------------------- Impression  Brief Consulation  Ms. Delangel is her for antenatal testing due to poorly controlled  T2DM. She is seen at the request of Drema Dallas, MD with  [facility].  She is overall doing well and contiues to work on her blood  sugar control.  Antenatal testing performed given maternal T2DM  The biophysical profile was 10/10 with good fetal movement  and amniotic fluid volume.  There is a small pericardial effusion. When measured in  diastole it measures <2 mm.  Ms. March Rummage reports that her fasting and dinner 2hrpp is still  mildly elevated.  Secondly, while her testing was reactive due to moderate  variability and 15 x15 accelerations, Ms. March Rummage was  contracting that resulted in responsive variable to late  deceleration. Given that the tracing was only 20 minutes we  sent Ms. Waller for prolonged monitoring and evaluation.  If her contraction continue to result in decelerations, I  recommend delivery without betmethasone administration as  this group was excluded from late preterm BMZ  administration.  I spent 30 minutes with > 50% in face to face consultation  and care coordination.  I discussed this plan with Dr. Delora Fuel  as well.  Vikki Ports, MD ---------------------------------------------------------------------- Recommendations  Continue weekly testing  Planned delivery at 37 weeks. ----------------------------------------------------------------------               Sander Nephew, MD Electronically Signed Final Report   01/09/2022 04:28 pm  FHT: 145 baseline, moderate variability, 15x15 accels,  two spontaneous decelerations Toco: irregular  Labs: see orders  A/P:  26 y.o. [id number] @ [redacted]w[redacted]d who presents for inpatient observation for non-reassuring fetal heart tracing.  Non-reassuring fetal heart tracing - Admit to [facility] - Admit labs  - CEFM/Toco - FWB:  Reassuring overall currently, two spontaneous decelerations which quickly resolved - Diet:  Modified carb diet - IVF:  LR 1000cc bolus and then SLIV - VTE Prophylaxis:  SCDs - GBS Status:  Unknown, collected and pending - Presentation:  Cephalic on [place] and by sutures - Plan per MFM (Dr. Gertie Exon): Prolonged monitoring and evaluation with CEFM. If  contractions continue to result in decelerations, recommend delivery without betamethasone administration as this group was excluded from the late preterm BMZ administration  Pre-gestational Type II DM - Medications: Basaglar 20u qHS (inpatient equivalent Semglee ordered) and Humalog 26u TID with meals (inpatient equivalent Novolog 26u TID ordered) - Endocrinologist: Dr. Renato Shin [facility]) - CBG fasting and 2h postprandial - Last fetal growth on 12/19/21: EFW 2464g (5lbs7oz, 99%), normal amniotic fluid, posterior placenta  History of PPROM/PTD at 34 weeks - Medication: Makena q Friday (ordered)  HSV-1 affecting genitalia - Medication: Valtrex 1 gram daily (ordered) - No lesions on speculum exam  Disposition: If reassuring fetal monitoring overnight into tomorrow, likely D/C home with continued outpatient follow-up.  Drema Dallas, DO [phone]  (office)

## 2022-01-09 NOTE — Progress Notes (Signed)
Brief Consulation  [name] is her for antenatal testing due to poorly controlled T2DM. She is seen at the request of Steva Ready, MD with [facility].  She is overall doing well and contiues to work on her blood sugar control.  Antenatal testing performed given maternal T2DM The biophysical profile was 10/10 with good fetal movement and amniotic fluid volume.  There is a small pericardial effusion. When measured in diastole it measures <2 mm.   [name] reports that her fasting and dinner 2hrpp is still mildly elevated.   Secondly, while her testing was reactive due to moderate variability and 15 x15 accelerations, [name] was contracting that resulted in responsive variable to late deceleration. Given that the tracing was only 20 minutes we sent [name] for prolonged monitoring and evaluation.   If her contraction continue to result in decelerations, I recommend delivery without betmethasone administration as this group was excluded from late preterm BMZ administration.  I spent 30 minutes with > 50% in face to face consultation and care coordination.  I discussed this plan with Dr. Connye Burkitt as well.  Novella Olive, MD

## 2022-01-10 DIAGNOSIS — O4703 False labor before 37 completed weeks of gestation, third trimester: Secondary | ICD-10-CM | POA: Diagnosis not present

## 2022-01-10 DIAGNOSIS — O479 False labor, unspecified: Secondary | ICD-10-CM | POA: Diagnosis present

## 2022-01-10 LAB — TYPE AND SCREEN
ABO/RH(D): B POS
Antibody Screen: NEGATIVE

## 2022-01-10 LAB — RPR: RPR Ser Ql: NONREACTIVE

## 2022-01-10 LAB — GLUCOSE, CAPILLARY: Glucose-Capillary: 100 mg/dL — ABNORMAL HIGH (ref 70–99)

## 2022-01-10 NOTE — Discharge Summary (Signed)
[facility] Discharge Summary     Patient Name: Jill Montoya DOB: 1996/10/08 MRN: [id number]  Date of admission: 01/09/2022 Type of discharge: 01/10/2022    Admitting diagnosis: Non-reassuring fetal heart rate or rhythm affecting management of mother [id number] of which was not witnessed during her stay here, and no cervical change.   Intrauterine pregnancy: [redacted]w[redacted]d     Secondary diagnosis:  Active Problems:   False labor   Braxton Hick's contraction                             History of Present Illness: Jill Montoya is a 26 y.o. female, [phone], who presents at [redacted]w[redacted]d weeks gestation. The patient has been followed at  [place] with DR Landry Mellow.   Her pregnancy has been complicated by:  Patient Active Problem List   Diagnosis Date Noted   False labor 01/10/2022   Braxton Hick's contraction 01/10/2022   Hypotension 09/12/2021   Acute gastroenteritis 03/10/2020   Insulin-requiring or dependent type II diabetes mellitus ([place]) 03/10/2020   Preterm premature rupture of membranes 07/04/2018   GBS (group b Streptococcus) UTI complicating pregnancy, third trimester 07/01/2018   Cutaneous candidiasis 04/10/2018   Diabetes mellitus during pregnancy, antepartum 04/07/2018     Active Ambulatory Problems    Diagnosis Date Noted   Diabetes mellitus during pregnancy, antepartum 04/07/2018   Cutaneous candidiasis 04/10/2018   GBS (group b Streptococcus) UTI complicating pregnancy, third trimester 07/01/2018   Preterm premature rupture of membranes 07/04/2018   Acute gastroenteritis 03/10/2020   Insulin-requiring or dependent type II diabetes mellitus ([place]) 03/10/2020   Hypotension 09/12/2021   Resolved Ambulatory Problems    Diagnosis Date Noted   No Resolved Ambulatory Problems   Past Medical History:  Diagnosis Date   Chlamydia    Diabetes mellitus without complication ([place])    HSV (herpes simplex virus) infection      Hospital course:    HPI 01/09/2022: 26 y.o.  G3P0111 @ [redacted]w[redacted]d estimated gestational age (as dated by LMP c/w 6 week ultrasound) presents for inpatient observation for non-reassuring fetal heart tracing. Patient was seen at [facility] today for routine fetal testing. BPP was 10/10; however, after contractions she had variable to late decelerations and Dr. Gertie Exon recommended inpatient observation.    Patient reports not feeling any significant pain with her first delivery and is unsure what the difference between a Braxton-Hicks contraction and normal contraction feels like.    Leakage of fluid:  No Vaginal bleeding:  No Contractions:  Yes but very mild Fetal movement:  Yes   Prenatal care has been provided by Dr. Christophe Louis [facility])   ROS:  Denies fevers, chills, chest pain, visual changes, SOB, RUQ/epigastric pain, N/V, dysuria, hematuria, or sudden onset/worsening bilateral LE or facial edema.   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 Consulted with DR Alesia Richards and DR Delora Fuel at 1700 on 2/24, plan if strip remains reassuring and no cervical change noted pt may be discharged to home with close follow with DR Landry Mellow, pt verbalized close follow  up appointments already made, NST reactive, BPP on 2/24 10/10, next growth with MFM scheduled for this Friday. Pt will continue mekina on fridays. Pt placed on strict cxt , bleeding, precaution and bed rest for home.   Physical exam  Vitals:   01/09/22 1802 01/09/22 2027 01/10/22 0307 01/10/22 0805  BP:  113/65 110/61 117/68  Pulse:  (!) 108 97 99  Resp:  18 16 18   Temp:  97.9 F  (36.6 C) 97.7 F (36.5 C) 98.2 F (36.8 C)  TempSrc:  Oral Oral Oral  SpO2:  98% 97% 98%  Weight: 101.6 kg     Height: 5\' 4"  (1.626 m)      Gen. AAOx3, NAD CV.  RRR  Resp. CTAB, no wheezes/rales/rhonchi Abd. Gravid, soft, non-tender throughout, no rebound/guarding Extr.  Trace bilateral LE edema, no calf tenderness bilaterally Neuro: 2+ bilateral brachioradialis DTRs SVE: Remained her enitre stay 3.5/60/-2, sutures palpated        SSE: No vulvar/vaginal/cervical lesions.  No blood visualized in vaginal vault.  Labs: Lab Results  Component Value Date   WBC 9.3 01/09/2022   HGB 11.5 (L) 01/09/2022   HCT 34.4 (L) 01/09/2022   MCV 92.0 01/09/2022   PLT 298 01/09/2022   CMP Latest Ref Rng & Units 01/09/2022  Glucose 70 - 99 mg/dL 81  BUN 6 - 20 mg/dL 6  Creatinine 0.44 - 1.00 mg/dL 0.59  Sodium 135 - 145 mmol/L 134(L)  Potassium 3.5 - 5.1 mmol/L 3.9  Chloride 98 - 111 mmol/L 105  CO2 22 - 32 mmol/L 21(L)  Calcium 8.9 - 10.3 mg/dL 8.8(L)  Total Protein 6.5 - 8.1 g/dL 5.9(L)  Total Bilirubin 0.3 - 1.2 mg/dL 0.5  Alkaline Phos 38 - 126 U/L 55  AST 15 - 41 U/L 18  ALT 0 - 44 U/L 20    Date of discharge: 01/10/2022 Discharge Diagnoses: False labor-undelivered Discharge instruction: per After Visit Summary, bleeding precautions, labor precaution and fetal kick counts.   After visit meds:   Activity:           pelvic rest Advance as tolerated. Pelvic rest for 6 weeks.  Diet:                 Low carb diet for DM Medications: PNV and continue DM insulin.  Condition:  Pt discharge to home stable and condition  F/I DM medication regimen already in place, plan for IOL at 37-38 weeks.   Meds: Allergies as of 01/10/2022       Reactions   Nasonex [mometasone Furoate]    "My nasal passages close up"        Medication List     TAKE these medications    ALBUTEROL IN Inhale into the lungs.   aspirin EC 81 MG tablet Take 81 mg by mouth daily. Swallow whole.   Basaglar  KwikPen 100 UNIT/ML Inject 20 Units into the skin daily.   Dexcom G6 Sensor Misc 1 Device by Does not apply route See admin instructions. change every 10 days   Dexcom G6 Transmitter Misc Change every 3 mos   freestyle lancets Use as instructed   FREESTYLE LITE test strip Generic drug: glucose blood Use to check BS 4x a day   insulin lispro 100 UNIT/ML KwikPen Commonly known as: HumaLOG KwikPen Inject 26 Units into the skin 3 (three) times daily with meals.   prenatal multivitamin Tabs tablet Take 1 tablet by mouth daily at 12 noon.   valACYclovir 500 MG  tablet Commonly known as: VALTREX Take 500 mg by mouth daily.        Discharge Follow Up:   Follow-up Information     [facility], [place] Follow up.   Specialty: Obstetrics and Gynecology Why: Continue to go to your appoointments already scheduled. Next growth with MFM friday. Contact information: [address] [place] [address] [phone]                  [facility], NP-C, CNM 01/10/2022, 9:05 AM  Noralyn Pick, [place]

## 2022-01-11 LAB — CULTURE, BETA STREP (GROUP B ONLY)

## 2022-01-12 ENCOUNTER — Other Ambulatory Visit: Payer: Self-pay | Admitting: Maternal & Fetal Medicine

## 2022-01-12 DIAGNOSIS — O24113 Pre-existing diabetes mellitus, type 2, in pregnancy, third trimester: Secondary | ICD-10-CM

## 2022-01-12 LAB — GC/CHLAMYDIA PROBE AMP (~~LOC~~) NOT AT ARMC
Chlamydia: NEGATIVE
Comment: NEGATIVE
Comment: NORMAL
Neisseria Gonorrhea: NEGATIVE

## 2022-01-13 ENCOUNTER — Inpatient Hospital Stay (HOSPITAL_COMMUNITY)
Admission: AD | Admit: 2022-01-13 | Discharge: 2022-01-14 | Disposition: A | Discharge status: Home / Self Care | Payer: PRIVATE HEALTH INSURANCE | Attending: Obstetrics and Gynecology | Admitting: Obstetrics and Gynecology

## 2022-01-13 ENCOUNTER — Encounter (HOSPITAL_COMMUNITY): Payer: Self-pay | Admitting: Obstetrics and Gynecology

## 2022-01-13 ENCOUNTER — Other Ambulatory Visit: Payer: Self-pay

## 2022-01-13 DIAGNOSIS — Z3689 Encounter for other specified antenatal screening: Secondary | ICD-10-CM

## 2022-01-13 DIAGNOSIS — Z7982 Long term (current) use of aspirin: Secondary | ICD-10-CM | POA: Insufficient documentation

## 2022-01-13 DIAGNOSIS — O24113 Pre-existing diabetes mellitus, type 2, in pregnancy, third trimester: Secondary | ICD-10-CM | POA: Insufficient documentation

## 2022-01-13 DIAGNOSIS — Z3A35 35 weeks gestation of pregnancy: Secondary | ICD-10-CM | POA: Diagnosis not present

## 2022-01-13 DIAGNOSIS — Z794 Long term (current) use of insulin: Secondary | ICD-10-CM | POA: Insufficient documentation

## 2022-01-13 DIAGNOSIS — O479 False labor, unspecified: Secondary | ICD-10-CM | POA: Diagnosis not present

## 2022-01-13 DIAGNOSIS — E119 Type 2 diabetes mellitus without complications: Secondary | ICD-10-CM

## 2022-01-13 LAB — URINALYSIS, ROUTINE W REFLEX MICROSCOPIC
Bacteria, UA: NONE SEEN
Bilirubin Urine: NEGATIVE
Glucose, UA: >=500 mg/dL — AB
Hgb urine dipstick: NEGATIVE
Ketones, ur: 20 mg/dL — AB
Leukocytes,Ua: NEGATIVE
Nitrite: NEGATIVE
Protein, ur: NEGATIVE mg/dL
Specific Gravity, Urine: 1.035 — ABNORMAL HIGH (ref 1.005–1.030)
pH: 6 (ref 5.0–8.0)

## 2022-01-13 LAB — CBC
HCT: 33.5 % — ABNORMAL LOW (ref 36.0–46.0)
Hemoglobin: 11.1 g/dL — ABNORMAL LOW (ref 12.0–15.0)
MCH: 30.6 pg (ref 26.0–34.0)
MCHC: 33.1 g/dL (ref 30.0–36.0)
MCV: 92.3 fL (ref 80.0–100.0)
Platelets: 292 10*3/uL (ref 150–400)
RBC: 3.63 MIL/uL — ABNORMAL LOW (ref 3.87–5.11)
RDW: 12.9 % (ref 11.5–15.5)
WBC: 8.9 10*3/uL (ref 4.0–10.5)
nRBC: 0 % (ref 0.0–0.2)

## 2022-01-13 LAB — COMPREHENSIVE METABOLIC PANEL
ALT: 19 U/L (ref 0–44)
AST: 18 U/L (ref 15–41)
Albumin: 2.9 g/dL — ABNORMAL LOW (ref 3.5–5.0)
Alkaline Phosphatase: 60 U/L (ref 38–126)
Anion gap: 9 (ref 5–15)
BUN: 8 mg/dL (ref 6–20)
CO2: 20 mmol/L — ABNORMAL LOW (ref 22–32)
Calcium: 8.8 mg/dL — ABNORMAL LOW (ref 8.9–10.3)
Chloride: 105 mmol/L (ref 98–111)
Creatinine, Ser: 0.74 mg/dL (ref 0.44–1.00)
GFR, Estimated: >60 mL/min (ref >60)
Glucose, Bld: 271 mg/dL — ABNORMAL HIGH (ref 70–99)
Potassium: 3.6 mmol/L (ref 3.5–5.1)
Sodium: 134 mmol/L — ABNORMAL LOW (ref 135–145)
Total Bilirubin: 0.4 mg/dL (ref 0.3–1.2)
Total Protein: 6.2 g/dL — ABNORMAL LOW (ref 6.5–8.1)

## 2022-01-13 LAB — GLUCOSE, CAPILLARY
Glucose-Capillary: 237 mg/dL — ABNORMAL HIGH (ref 70–99)
Glucose-Capillary: 252 mg/dL — ABNORMAL HIGH (ref 70–99)

## 2022-01-13 MED ORDER — INSULIN GLARGINE-YFGN 100 UNIT/ML ~~LOC~~ SOLN
20.0000 [IU] | Freq: Once | SUBCUTANEOUS | Status: AC
  Administered 2022-01-13: 20 [IU] via SUBCUTANEOUS
  Filled 2022-01-13: qty 0.2

## 2022-01-13 MED ORDER — LACTATED RINGERS IV BOLUS
1000.0000 mL | Freq: Once | INTRAVENOUS | Status: AC
  Administered 2022-01-13: 1000 mL via INTRAVENOUS

## 2022-01-13 NOTE — MAU Provider Note (Signed)
History     CSN: [id number]  Arrival date and time: 01/13/22 2130   Event Date/Time   First Provider Initiated Contact with Patient 01/13/22 2157      Chief Complaint  Patient presents with   Abdominal Pain   HPI Jill Montoya is a 26 y.o. [id number] at 102w3dwho presents to MAU with chief complaint of preterm contractions and concern for active labor. Patient endorses new onset contractions this morning around 0900. They were 4 minutes apart for over one hour so she got into the shower in an attempt to relax. Her contractions resolved but restarted this evening and are accompanied by rectal pressure. She denies vaginal bleeding, leaking of fluid, decreased fetal movement, fever, falls, or recent illness.   Patient's pregnancy is c/b T2DM on Basaglar 20 u qhs and Humalog 26 units TID as well as preterm delivery at 326w5dn 2019. On arrival to MAU patient endorsed taking all daily insulin except her qhs Basaglar, which she typically takes at 2200 hours. After initial assessment, patient shared that dinner time is when she and her partner were discussing coming to MAU and she likely missed her dinner Humalog.  Patient receives care with [facility].  OB History     Gravida  3   Para  1   Term      Preterm  1   AB  1   Living  1      SAB  1   IAB      Ectopic      Multiple      Live Births              Past Medical History:  Diagnosis Date   Chlamydia    Diabetes mellitus without complication (HCC)    HSV (herpes simplex virus) infection     Past Surgical History:  Procedure Laterality Date   NO PAST SURGERIES      Family History  Problem Relation Age of Onset   Diabetes Mother    Diabetes Father    Aneurysm Paternal Grandmother     Social History   Tobacco Use   Smoking status: Never   Smokeless tobacco: Never  Vaping Use   Vaping Use: Never used  Substance Use Topics   Alcohol use: No   Drug use: Never    Allergies:  Allergies  Allergen  Reactions   Nasonex [Mometasone Furoate]     "My nasal passages close up"    Medications Prior to Admission  Medication Sig Dispense Refill Last Dose   aspirin EC 81 MG tablet Take 81 mg by mouth daily. Swallow whole.   01/13/2022   Insulin Glargine (BASAGLAR KWIKPEN) 100 UNIT/ML Inject 20 Units into the skin daily. 30 mL 3 01/12/2022   insulin lispro (HUMALOG KWIKPEN) 100 UNIT/ML KwikPen Inject 26 Units into the skin 3 (three) times daily with meals. 75 mL 3 01/13/2022 at 2000   Prenatal Vit-Fe Fumarate-FA (PRENATAL MULTIVITAMIN) TABS tablet Take 1 tablet by mouth daily at 12 noon.   01/13/2022   valACYclovir (VALTREX) 500 MG tablet Take 500 mg by mouth daily.   01/13/2022   ALBUTEROL IN Inhale into the lungs.   More than a month   Continuous Blood Gluc Sensor (DEXCOM G6 SENSOR) MISC 1 Device by Does not apply route See admin instructions. change every 10 days 9 each 3    Continuous Blood Gluc Transmit (DEXCOM G6 TRANSMITTER) MISC Change every 3 mos 1 each 3    glucose  blood (FREESTYLE LITE) test strip Use to check BS 4x a day 100 each 12    Lancets (FREESTYLE) lancets Use as instructed 100 each 12     Review of Systems  Musculoskeletal:        Pressure and heaviness in her pelvis which is how her contractions typically manifest.  All other systems reviewed and are negative. Physical Exam   Blood pressure 104/64, pulse (!) 111, temperature 98.2 F (36.8 C), temperature source Oral, resp. rate 20, height 5' 4"  (1.626 m), weight 104.4 kg, last menstrual period 05/10/2021, unknown if currently breastfeeding.  Physical Exam Vitals and nursing note reviewed. Exam conducted with a chaperone present.  Constitutional:      Appearance: She is well-developed. She is not ill-appearing.  Cardiovascular:     Rate and Rhythm: Normal rate and regular rhythm.     Heart sounds: Normal heart sounds.  Pulmonary:     Effort: Pulmonary effort is normal.     Breath sounds: Normal breath sounds.   Abdominal:     Comments: Gravid  Neurological:     Mental Status: She is alert.    MAU Course  Procedures  MDM --2200: met by CNM on arrival due to report of rectal pressure, inpatient notes from 02/25 indicating patient was 3-4cm, and history of preterm birth. Cervix 3.5/thick/posterior, unchanged from 02/25.   --Given GA of 62w4dwill not tocolize but will offer IV fluid bolus for improved comfort.   --Reactive tracing: baseline 130, mod var, + 15 x 15 accels, no decels  --Toco: initially q 2-4 min, improving s/p fluid bolus, now predominantly uterine irritability  --2300: CNM returned to bedside to discuss CBG of 252. Patient states she did not take her dinner dose of insulin. Diet recall for dinner includes hot wings and Dr. PMalachi Bonds Will collect labs, discuss with Dr. PIlda Basset --Sliding scale insulin given per discussion with Dr.Pickens  --0120: CBG improving but remains 227 one hour s/p sliding scale insulin. CNM at bedside to recheck cervix 3.5 hours s/p initial assessment. Cervix remains 3.5  --0130: Called Dr. DDelora Fuel patient course reviewed including most recent CBG of 227. Discussed that patient's abdominal pain has caused her to miss dosing s/p high sugar dinner. She has previously been well controlled when inpatient (02/24-02/25) on same regimen. Low suspicion for acute event requiring additional management. Dr. DDelora Fuelin agreement and approves plan to discharge home. Patient to keep appointment in office 01/16/2022.  Orders Placed This Encounter  Procedures   Glucose, capillary   Urinalysis, Routine w reflex microscopic Urine, Clean Catch   CBC   Comprehensive metabolic panel   Glucose, capillary   Glucose, capillary   Notify physician (specify)   Insert peripheral IV   Discharge patient   Results for orders placed or performed during the hospital encounter of 01/13/22 (from the past 24 hour(s))  Glucose, capillary     Status: Abnormal   Collection Time:  01/13/22 10:44 PM  Result Value Ref Range   Glucose-Capillary 252 (H) 70 - 99 mg/dL  Urinalysis, Routine w reflex microscopic Urine, Clean Catch     Status: Abnormal   Collection Time: 01/13/22 10:50 PM  Result Value Ref Range   Color, Urine YELLOW YELLOW   APPearance CLEAR CLEAR   Specific Gravity, Urine 1.035 (H) 1.005 - 1.030   pH 6.0 5.0 - 8.0   Glucose, UA >=500 (A) NEGATIVE mg/dL   Hgb urine dipstick NEGATIVE NEGATIVE   Bilirubin Urine NEGATIVE NEGATIVE   Ketones,  ur 20 (A) NEGATIVE mg/dL   Protein, ur NEGATIVE NEGATIVE mg/dL   Nitrite NEGATIVE NEGATIVE   Leukocytes,Ua NEGATIVE NEGATIVE   RBC / HPF 0-5 0 - 5 RBC/hpf   WBC, UA 0-5 0 - 5 WBC/hpf   Bacteria, UA NONE SEEN NONE SEEN   Squamous Epithelial / LPF 0-5 0 - 5  CBC     Status: Abnormal   Collection Time: 01/13/22 10:55 PM  Result Value Ref Range   WBC 8.9 4.0 - 10.5 K/uL   RBC 3.63 (L) 3.87 - 5.11 MIL/uL   Hemoglobin 11.1 (L) 12.0 - 15.0 g/dL   HCT 33.5 (L) 36.0 - 46.0 %   MCV 92.3 80.0 - 100.0 fL   MCH 30.6 26.0 - 34.0 pg   MCHC 33.1 30.0 - 36.0 g/dL   RDW 12.9 11.5 - 15.5 %   Platelets 292 150 - 400 K/uL   nRBC 0.0 0.0 - 0.2 %  Comprehensive metabolic panel     Status: Abnormal   Collection Time: 01/13/22 10:55 PM  Result Value Ref Range   Sodium 134 (L) 135 - 145 mmol/L   Potassium 3.6 3.5 - 5.1 mmol/L   Chloride 105 98 - 111 mmol/L   CO2 20 (L) 22 - 32 mmol/L   Glucose, Bld 271 (H) 70 - 99 mg/dL   BUN 8 6 - 20 mg/dL   Creatinine, Ser 0.74 0.44 - 1.00 mg/dL   Calcium 8.8 (L) 8.9 - 10.3 mg/dL   Total Protein 6.2 (L) 6.5 - 8.1 g/dL   Albumin 2.9 (L) 3.5 - 5.0 g/dL   AST 18 15 - 41 U/L   ALT 19 0 - 44 U/L   Alkaline Phosphatase 60 38 - 126 U/L   Total Bilirubin 0.4 0.3 - 1.2 mg/dL   GFR, Estimated >60 >60 mL/min   Anion gap 9 5 - 15  Glucose, capillary     Status: Abnormal   Collection Time: 01/13/22 11:54 PM  Result Value Ref Range   Glucose-Capillary 237 (H) 70 - 99 mg/dL  Glucose, capillary      Status: Abnormal   Collection Time: 01/14/22  1:22 AM  Result Value Ref Range   Glucose-Capillary 227 (H) 70 - 99 mg/dL   Meds ordered this encounter  Medications   insulin glargine-yfgn (SEMGLEE) injection 20 Units    Takes Basaglar 20 units qhs   lactated ringers bolus 1,000 mL   insulin aspart (novoLOG) injection 3 Units    Missed Humalog dinner dose, given qHG Lantus dose in MAU, CBG remains 237.   Assessment and Plan  --26 y.o. [id number] at [redacted]w[redacted]d --Reactive tracing --Cervix remains 3.5cm, unchanged from inpatient last week --T2DM, elevated CBGs in MAU 2/2 missed outpatient doses due to pain, arrival in MAU --S/p discussion with Dr. PIlda Bassetand Dr. DDelora Fuel--Discharge home in stable condition  F/U: Patient's next appointment with [facility] is Friday 01/16/2022  SDarlina Rumpf CNM 01/13/2022, 2:17 AM

## 2022-01-13 NOTE — MAU Note (Signed)
PT SAYS SHE WAS HAVING UC'S REG TOOK SHOWER - UC STOPPED BUT NOW SHE FEELS CONSTANT TIGHTENING FEELS ANAL  PRESSURE - STARTED  AT 9 PNC WITH [facility]  LAST SEX- SAT HAS HX - HSV- LAST OUTBREAK  01-2021

## 2022-01-14 DIAGNOSIS — O479 False labor, unspecified: Secondary | ICD-10-CM

## 2022-01-14 DIAGNOSIS — Z3689 Encounter for other specified antenatal screening: Secondary | ICD-10-CM | POA: Diagnosis not present

## 2022-01-14 DIAGNOSIS — O24113 Pre-existing diabetes mellitus, type 2, in pregnancy, third trimester: Secondary | ICD-10-CM | POA: Diagnosis not present

## 2022-01-14 LAB — GLUCOSE, CAPILLARY: Glucose-Capillary: 227 mg/dL — ABNORMAL HIGH (ref 70–99)

## 2022-01-14 MED ORDER — INSULIN ASPART 100 UNIT/ML IJ SOLN
3.0000 [IU] | Freq: Once | INTRAMUSCULAR | Status: AC
  Administered 2022-01-14: 3 [IU] via SUBCUTANEOUS

## 2022-01-16 ENCOUNTER — Ambulatory Visit: Payer: Medicaid Other | Admitting: *Deleted

## 2022-01-16 ENCOUNTER — Other Ambulatory Visit: Payer: Self-pay | Admitting: Obstetrics and Gynecology

## 2022-01-16 ENCOUNTER — Ambulatory Visit: Payer: Medicaid Other | Attending: Maternal & Fetal Medicine | Admitting: Maternal & Fetal Medicine

## 2022-01-16 ENCOUNTER — Ambulatory Visit (HOSPITAL_BASED_OUTPATIENT_CLINIC_OR_DEPARTMENT_OTHER): Payer: Medicaid Other | Admitting: *Deleted

## 2022-01-16 ENCOUNTER — Other Ambulatory Visit: Payer: Self-pay

## 2022-01-16 ENCOUNTER — Ambulatory Visit (HOSPITAL_BASED_OUTPATIENT_CLINIC_OR_DEPARTMENT_OTHER): Payer: Medicaid Other

## 2022-01-16 VITALS — BP 120/69 | HR 108

## 2022-01-16 DIAGNOSIS — O99213 Obesity complicating pregnancy, third trimester: Secondary | ICD-10-CM

## 2022-01-16 DIAGNOSIS — Z3A35 35 weeks gestation of pregnancy: Secondary | ICD-10-CM

## 2022-01-16 DIAGNOSIS — O24113 Pre-existing diabetes mellitus, type 2, in pregnancy, third trimester: Secondary | ICD-10-CM

## 2022-01-16 DIAGNOSIS — Z362 Encounter for other antenatal screening follow-up: Secondary | ICD-10-CM | POA: Diagnosis not present

## 2022-01-16 DIAGNOSIS — Z794 Long term (current) use of insulin: Secondary | ICD-10-CM

## 2022-01-16 DIAGNOSIS — O479 False labor, unspecified: Secondary | ICD-10-CM

## 2022-01-16 DIAGNOSIS — E669 Obesity, unspecified: Secondary | ICD-10-CM

## 2022-01-16 DIAGNOSIS — O09213 Supervision of pregnancy with history of pre-term labor, third trimester: Secondary | ICD-10-CM | POA: Diagnosis not present

## 2022-01-16 DIAGNOSIS — E119 Type 2 diabetes mellitus without complications: Secondary | ICD-10-CM | POA: Diagnosis not present

## 2022-01-16 NOTE — H&P (Signed)
Jill Montoya is a 26 y.o. female [phone] at 36 weeks and 0 days  presenting for induction of labor due to poorly controlled Type 2 DM. Pt has been followed by MFM. She saw Dr. Denyse Amass booker on 01/16/2022 reporting continued poorly controlled type 2 DM. Recent admission for fetal heart rate decelerations that resolved during observation. Dr. Grace Bushy recommends delivery at [redacted] wks EGA. 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 Prenatal care provided by Dr. Gerald Montoya with [facility].. ? ?U/S 03/03/ 2023.  EFW 8 lbs 5 oz >99%ile .. placenta is posterior BPP10/10 ? ?OB History   ? ? Gravida  ?3  ? Para  ?1  ? Term  ?   ? Preterm  ?1  ? AB  ?1  ? Living  ?1  ?  ? ? SAB  ?1  ? IAB  ?   ? Ectopic  ?   ? Multiple  ?   ? Live Births  ?   ?   ?  ?  ? ?Past Medical History:  ?Diagnosis Date  ? Chlamydia   ? Diabetes mellitus without complication (HCC)   ? HSV (herpes simplex virus) infection   ? ?Past Surgical History:  ?Procedure Laterality Date  ? NO PAST SURGERIES    ? ?Family History: family history includes Aneurysm in her paternal grandmother; Diabetes in her father and mother. ?Social History:  reports that she has never smoked. She has never used smokeless tobacco. She reports that she does not drink alcohol and does not use drugs. ? ?Medications: 26 units of humalog with meals / Basaglar 20 units qhs ?Vatlrex  ?PNV  ?Aspirin 81 mg  ? ?Allergies: Nasonex  ? ?  ?Maternal Diabetes: Yes:  Diabetes Type:  Pre-pregnancy ?Genetic Screening: Normal ?Maternal Ultrasounds/Referrals: Normal ?Fetal Ultrasounds or other Referrals:  Fetal echo ?Maternal Substance Abuse:  No ?Significant Maternal Medications:  Meds include: Other:  ?Significant Maternal Lab Results:  Group B Strep negative ?Other Comments:  None ? ?Review of Systems  ?Constitutional: Negative.   ?HENT: Negative.    ?Eyes: Negative.   ?Respiratory: Negative.    ?Cardiovascular: Negative.   ?Gastrointestinal: Negative.    ?Endocrine: Negative.   ?Genitourinary: Negative.   ?Musculoskeletal: Negative.   ?Skin: Negative.   ?Allergic/Immunologic: Negative.   ?Neurological: Negative.   ?Hematological: Negative.   ?Psychiatric/Behavioral: Negative.    ?History ?  ?Last menstrual period 05/10/2021, unknown if currently breastfeeding. ?Maternal Exam:  ?Introitus: Normal vulva.  ?Physical Exam ?Vitals reviewed.  ?Constitutional:   ?   Appearance: Normal appearance.  ?HENT:  ?   Head: Normocephalic and atraumatic.  ?   Nose: Nose normal.  ?   Mouth/Throat:  ?   Mouth: Mucous membranes are moist.  ?Eyes:  ?   Pupils: Pupils are equal, round, and reactive to light.  ?Cardiovascular:  ?   Rate and Rhythm: Normal rate and regular rhythm.  ?   Pulses: Normal pulses.  ?   Heart sounds: Normal heart sounds.  ?Pulmonary:  ?   Effort: Pulmonary effort is normal.  ?   Breath sounds: Normal breath sounds.  ?Abdominal:  ?   Tenderness: There is no abdominal tenderness.  ?Genitourinary: ?   General: Normal vulva.  ?   Comments: Cervix 3.5 cm/ 75/-2 cephalic  ?Musculoskeletal:     ?   General: Swelling present. Normal range of motion.  ?   Cervical back: Normal range of motion and neck  supple.  ?Skin: ?   General: Skin is warm and dry.  ?Neurological:  ?   General: No focal deficit present.  ?   Mental Status: She is alert and oriented to person, place, and time.  ?Psychiatric:     ?   Mood and Affect: Mood normal.     ?   Behavior: Behavior normal.  ?  ?Prenatal labs: ?ABO, Rh: --/--/B POS (02/24 1558) ?Antibody: NEG (02/24 1558) ?Rubella:   ?RPR: NON REACTIVE (02/24 1558)  ?HBsAg:   ?HIV:   ?GBS:   Negative 01/09/2022 ? ?Assessment/Plan: ?36 weeks and 0 days with poorly controlled Type 2 diabetes.  ?Admitted for induction per recommendation of Dr. Daisy Floro with mFM ?- Pitocin IV  ?- Endotool as needed  ?HSV infection - no active lesions  ?Anticipate SVD  ?[facility]OB covering 01/17/2022   ? ?Jill Montoya ?01/16/2022, 9:59 PM ? ? ? ? ?

## 2022-01-16 NOTE — Progress Notes (Signed)
MFM Brief Note ? ?Jill Montoya is a 26 yo G3P1 who  is here for follow up growth given poortly controlled T2DM. She is seen at the request of Dr. Gerald Leitz. ? ?Follow up growth due to poorly controlled T2DM. ?Normal interval growth with measurements consistent with large for gestational age. ?Good fetal movement and amniotic fluid volume  ?BPP 10/10- with elevated resting fetal heart rate of 160 bpm ? ?Ms. Hansen reports that her blood sugars are persistently poorly controlled. ? ?She was sent to L&D last week for recurrent decelerations. ? ?Given her blood sugars and prior poor fetal testing. I recommend delivery at [redacted] weeks gestation. ? ?I discussed this plan of care with Dr. Richardson Dopp who is in agreement with this plan. ? ?I spent 20 min with > 50% in face to face consultation. ? ?Novella Olive, MD ?

## 2022-01-16 NOTE — Procedures (Signed)
Jill Montoya ?1995-12-31 ?[redacted]w[redacted]d ? ?Fetus A Non-Stress Test Interpretation for 01/16/22 ? ?Indication: Diabetes ? ?Fetal Heart Rate A ?Mode: External ?Baseline Rate (A): 160 bpm ?Variability: Moderate, Marked ?Accelerations: 15 x 15 ?Decelerations: None ?Multiple birth?: No ? ?Uterine Activity ?Mode: Palpation, Toco ?Contraction Frequency (min): none ?Resting Tone Palpated: Relaxed ? ?Interpretation (Fetal Testing) ?Nonstress Test Interpretation: Reactive ?Overall Impression: Reassuring for gestational age ?Comments: Dr. Gertie Exon reviewed tracing. ? ? ?

## 2022-01-17 ENCOUNTER — Encounter (HOSPITAL_COMMUNITY): Payer: Self-pay | Admitting: Obstetrics and Gynecology

## 2022-01-17 ENCOUNTER — Inpatient Hospital Stay (HOSPITAL_COMMUNITY): Payer: Medicaid Other | Attending: Obstetrics and Gynecology

## 2022-01-17 ENCOUNTER — Other Ambulatory Visit: Payer: Self-pay

## 2022-01-17 ENCOUNTER — Inpatient Hospital Stay (HOSPITAL_COMMUNITY)
Admission: AD | Admit: 2022-01-17 | Discharge: 2022-01-20 | DRG: 806 | Disposition: A | Discharge status: Home / Self Care | Payer: Medicaid Other | Attending: Obstetrics and Gynecology | Admitting: Obstetrics and Gynecology

## 2022-01-17 DIAGNOSIS — Z3A36 36 weeks gestation of pregnancy: Secondary | ICD-10-CM

## 2022-01-17 DIAGNOSIS — O2412 Pre-existing diabetes mellitus, type 2, in childbirth: Secondary | ICD-10-CM | POA: Diagnosis present

## 2022-01-17 DIAGNOSIS — Z794 Long term (current) use of insulin: Secondary | ICD-10-CM | POA: Diagnosis not present

## 2022-01-17 DIAGNOSIS — O24113 Pre-existing diabetes mellitus, type 2, in pregnancy, third trimester: Principal | ICD-10-CM | POA: Diagnosis present

## 2022-01-17 DIAGNOSIS — Z23 Encounter for immunization: Secondary | ICD-10-CM | POA: Diagnosis not present

## 2022-01-17 DIAGNOSIS — E1165 Type 2 diabetes mellitus with hyperglycemia: Secondary | ICD-10-CM | POA: Diagnosis present

## 2022-01-17 LAB — CBC
HCT: 34.2 % — ABNORMAL LOW (ref 36.0–46.0)
Hemoglobin: 11.4 g/dL — ABNORMAL LOW (ref 12.0–15.0)
MCH: 31 pg (ref 26.0–34.0)
MCHC: 33.3 g/dL (ref 30.0–36.0)
MCV: 92.9 fL (ref 80.0–100.0)
Platelets: 268 10*3/uL (ref 150–400)
RBC: 3.68 MIL/uL — ABNORMAL LOW (ref 3.87–5.11)
RDW: 12.9 % (ref 11.5–15.5)
WBC: 8.3 10*3/uL (ref 4.0–10.5)
nRBC: 0 % (ref 0.0–0.2)

## 2022-01-17 LAB — GLUCOSE, CAPILLARY
Glucose-Capillary: 236 mg/dL — ABNORMAL HIGH (ref 70–99)
Glucose-Capillary: 287 mg/dL — ABNORMAL HIGH (ref 70–99)
Glucose-Capillary: 171 mg/dL — ABNORMAL HIGH (ref 70–99)
Glucose-Capillary: 166 mg/dL — ABNORMAL HIGH (ref 70–99)
Glucose-Capillary: 141 mg/dL — ABNORMAL HIGH (ref 70–99)
Glucose-Capillary: 109 mg/dL — ABNORMAL HIGH (ref 70–99)

## 2022-01-17 LAB — TYPE AND SCREEN
ABO/RH(D): B POS
Antibody Screen: NEGATIVE

## 2022-01-17 LAB — HEPATITIS B SURFACE ANTIGEN: Hepatitis B Surface Ag: NONREACTIVE

## 2022-01-17 LAB — HIV ANTIBODY (ROUTINE TESTING W REFLEX): HIV Screen 4th Generation wRfx: NONREACTIVE

## 2022-01-17 MED ORDER — DEXTROSE IN LACTATED RINGERS 5 % IV SOLN: INTRAVENOUS | Status: DC

## 2022-01-17 MED ORDER — LACTATED RINGERS IV SOLN: 500.0000 mL | INTRAVENOUS | Status: DC | PRN

## 2022-01-17 MED ORDER — SOD CITRATE-CITRIC ACID 500-334 MG/5ML PO SOLN: 30.0000 mL | ORAL | Status: DC | PRN

## 2022-01-17 MED ORDER — OXYTOCIN-SODIUM CHLORIDE 30-0.9 UT/500ML-% IV SOLN
1.0000 m[IU]/min | INTRAVENOUS | Status: DC
  Administered 2022-01-17: 2 m[IU]/min via INTRAVENOUS
  Filled 2022-01-17: qty 500

## 2022-01-17 MED ORDER — LACTATED RINGERS IV SOLN: INTRAVENOUS | Status: DC

## 2022-01-17 MED ORDER — DEXTROSE 50 % IV SOLN: 0.0000 mL | INTRAVENOUS | Status: DC | PRN

## 2022-01-17 MED ORDER — ONDANSETRON HCL 4 MG/2ML IJ SOLN: 4.0000 mg | Freq: Four times a day (QID) | INTRAMUSCULAR | Status: DC | PRN

## 2022-01-17 MED ORDER — TERBUTALINE SULFATE 1 MG/ML IJ SOLN: 0.2500 mg | Freq: Once | INTRAMUSCULAR | Status: DC | PRN

## 2022-01-17 MED ORDER — LIDOCAINE HCL (PF) 1 % IJ SOLN
30.0000 mL | INTRAMUSCULAR | Status: AC | PRN
  Administered 2022-01-18: 30 mL via SUBCUTANEOUS
  Filled 2022-01-17: qty 30

## 2022-01-17 MED ORDER — OXYTOCIN-SODIUM CHLORIDE 30-0.9 UT/500ML-% IV SOLN: 2.5000 [IU]/h | INTRAVENOUS | Status: DC

## 2022-01-17 MED ORDER — ACETAMINOPHEN 325 MG PO TABS: 650.0000 mg | ORAL_TABLET | ORAL | Status: DC | PRN

## 2022-01-17 MED ORDER — OXYCODONE-ACETAMINOPHEN 5-325 MG PO TABS: 2.0000 | ORAL_TABLET | ORAL | Status: DC | PRN

## 2022-01-17 MED ORDER — INSULIN REGULAR(HUMAN) IN NACL 100-0.9 UT/100ML-% IV SOLN
INTRAVENOUS | Status: DC
  Administered 2022-01-17: 12 [IU]/h via INTRAVENOUS
  Filled 2022-01-17: qty 100

## 2022-01-17 MED ORDER — OXYTOCIN BOLUS FROM INFUSION: 333.0000 mL | Freq: Once | INTRAVENOUS | Status: DC

## 2022-01-17 MED ORDER — OXYCODONE-ACETAMINOPHEN 5-325 MG PO TABS: 1.0000 | ORAL_TABLET | ORAL | Status: DC | PRN

## 2022-01-17 MED ORDER — FENTANYL CITRATE (PF) 100 MCG/2ML IJ SOLN
50.0000 ug | INTRAMUSCULAR | Status: DC | PRN
  Filled 2022-01-17: qty 2

## 2022-01-17 NOTE — Progress Notes (Signed)
Subjective:   ? ?Discussed plan of care for induction of labor, pain management, and use of glucose stabilizer. Questions answered. Pt supported by spouse and mother.  ? ?Objective:   ? ?VS: BP 125/85   Pulse (!) 111   Temp 98.3 ?F (36.8 ?C) (Oral)   Resp 18   Ht 5\' 4"  (1.626 m)   Wt 104.9 kg   LMP 05/10/2021   BMI 39.69 kg/m?  ?FHR : baseline 145 / variability moderate / accelerations present / absent decelerations ?Toco: contractions every irregular minutes  ?Membranes: intact ? Dilation: 4 ?Effacement (%): 60 ?Station: -2 ?Presentation: Vertex ?Exam by:: [email or web address], CNM ? ? ? ?Assessment/Plan:  ? ?26 y.o. 22 [redacted]w[redacted]d ?IOL for poorly controlled T2DM ? ?Labor:  will start Pitocin ?T2DM:  Insulin infusion w/ rate managed by Endo Tool ?Fetal Wellbeing:  Category I ?Pain Control:   plans IV sedation and epidural ?I/D:   GBS neg ?Anticipated MOD:  NSVD ? ?[redacted]w[redacted]d MSN, CNM ?01/17/2022 8:00 PM ? ?

## 2022-01-18 ENCOUNTER — Encounter (HOSPITAL_COMMUNITY): Payer: Self-pay | Admitting: Obstetrics & Gynecology

## 2022-01-18 ENCOUNTER — Inpatient Hospital Stay (HOSPITAL_COMMUNITY): Payer: Medicaid Other | Admitting: Anesthesiology

## 2022-01-18 LAB — BASIC METABOLIC PANEL
Anion gap: 8 (ref 5–15)
BUN: <5 mg/dL — ABNORMAL LOW (ref 6–20)
CO2: 19 mmol/L — ABNORMAL LOW (ref 22–32)
Calcium: 8.1 mg/dL — ABNORMAL LOW (ref 8.9–10.3)
Chloride: 108 mmol/L (ref 98–111)
Creatinine, Ser: 0.55 mg/dL (ref 0.44–1.00)
GFR, Estimated: >60 mL/min (ref >60)
Glucose, Bld: 108 mg/dL — ABNORMAL HIGH (ref 70–99)
Potassium: 3.4 mmol/L — ABNORMAL LOW (ref 3.5–5.1)
Sodium: 135 mmol/L (ref 135–145)

## 2022-01-18 LAB — GLUCOSE, CAPILLARY
Glucose-Capillary: 102 mg/dL — ABNORMAL HIGH (ref 70–99)
Glucose-Capillary: 106 mg/dL — ABNORMAL HIGH (ref 70–99)
Glucose-Capillary: 111 mg/dL — ABNORMAL HIGH (ref 70–99)
Glucose-Capillary: 114 mg/dL — ABNORMAL HIGH (ref 70–99)
Glucose-Capillary: 155 mg/dL — ABNORMAL HIGH (ref 70–99)
Glucose-Capillary: 206 mg/dL — ABNORMAL HIGH (ref 70–99)
Glucose-Capillary: 207 mg/dL — ABNORMAL HIGH (ref 70–99)
Glucose-Capillary: 93 mg/dL (ref 70–99)

## 2022-01-18 LAB — RPR: RPR Ser Ql: NONREACTIVE

## 2022-01-18 MED ORDER — ONDANSETRON HCL 4 MG/2ML IJ SOLN: 4.0000 mg | INTRAMUSCULAR | Status: DC | PRN

## 2022-01-18 MED ORDER — OXYCODONE HCL 5 MG PO TABS: 5.0000 mg | ORAL_TABLET | ORAL | Status: DC | PRN

## 2022-01-18 MED ORDER — INSULIN ASPART 100 UNIT/ML IJ SOLN
0.0000 [IU] | Freq: Three times a day (TID) | INTRAMUSCULAR | Status: DC
  Administered 2022-01-18: 7 [IU] via SUBCUTANEOUS
  Administered 2022-01-19: 3 [IU] via SUBCUTANEOUS
  Administered 2022-01-19: 4 [IU] via SUBCUTANEOUS

## 2022-01-18 MED ORDER — LACTATED RINGERS IV SOLN: 500.0000 mL | Freq: Once | INTRAVENOUS | Status: DC

## 2022-01-18 MED ORDER — INSULIN DETEMIR 100 UNIT/ML ~~LOC~~ SOLN
14.0000 [IU] | SUBCUTANEOUS | Status: DC
  Administered 2022-01-18: 14 [IU] via SUBCUTANEOUS
  Filled 2022-01-18 (×2): qty 0.14

## 2022-01-18 MED ORDER — BENZOCAINE-MENTHOL 20-0.5 % EX AERO
1.0000 "application " | INHALATION_SPRAY | CUTANEOUS | Status: DC | PRN
  Administered 2022-01-18: 1 via TOPICAL
  Filled 2022-01-18: qty 56

## 2022-01-18 MED ORDER — OXYCODONE HCL 5 MG PO TABS: 10.0000 mg | ORAL_TABLET | ORAL | Status: DC | PRN

## 2022-01-18 MED ORDER — LIDOCAINE HCL (PF) 1 % IJ SOLN
INTRAMUSCULAR | Status: DC | PRN
Start: 2022-01-18 — End: 2022-01-18
  Administered 2022-01-18: 8 mL via EPIDURAL

## 2022-01-18 MED ORDER — INFLUENZA VAC SPLIT QUAD 0.5 ML IM SUSY
0.5000 mL | PREFILLED_SYRINGE | INTRAMUSCULAR | Status: AC
  Administered 2022-01-19: 0.5 mL via INTRAMUSCULAR
  Filled 2022-01-18: qty 0.5

## 2022-01-18 MED ORDER — DIBUCAINE (PERIANAL) 1 % EX OINT: 1.0000 "application " | TOPICAL_OINTMENT | CUTANEOUS | Status: DC | PRN

## 2022-01-18 MED ORDER — SENNOSIDES-DOCUSATE SODIUM 8.6-50 MG PO TABS
2.0000 | ORAL_TABLET | Freq: Every day | ORAL | Status: DC
  Administered 2022-01-19: 2 via ORAL
  Filled 2022-01-18 (×2): qty 2

## 2022-01-18 MED ORDER — ACETAMINOPHEN 325 MG PO TABS: 650.0000 mg | ORAL_TABLET | ORAL | Status: DC | PRN

## 2022-01-18 MED ORDER — DIPHENHYDRAMINE HCL 50 MG/ML IJ SOLN: 12.5000 mg | INTRAMUSCULAR | Status: DC | PRN

## 2022-01-18 MED ORDER — TETANUS-DIPHTH-ACELL PERTUSSIS 5-2.5-18.5 LF-MCG/0.5 IM SUSY
0.5000 mL | PREFILLED_SYRINGE | Freq: Once | INTRAMUSCULAR | Status: DC
Start: 2022-01-19 — End: 2022-01-20

## 2022-01-18 MED ORDER — METHYLERGONOVINE MALEATE 0.2 MG/ML IJ SOLN
INTRAMUSCULAR | Status: AC
  Filled 2022-01-18: qty 1

## 2022-01-18 MED ORDER — SIMETHICONE 80 MG PO CHEW: 80.0000 mg | CHEWABLE_TABLET | ORAL | Status: DC | PRN

## 2022-01-18 MED ORDER — PHENYLEPHRINE 40 MCG/ML (10ML) SYRINGE FOR IV PUSH (FOR BLOOD PRESSURE SUPPORT): 80.0000 ug | PREFILLED_SYRINGE | INTRAVENOUS | Status: DC | PRN

## 2022-01-18 MED ORDER — LACTATED RINGERS IV SOLN: INTRAVENOUS | Status: DC

## 2022-01-18 MED ORDER — TRANEXAMIC ACID-NACL 1000-0.7 MG/100ML-% IV SOLN
1000.0000 mg | INTRAVENOUS | Status: AC
  Administered 2022-01-18: 1000 mg via INTRAVENOUS

## 2022-01-18 MED ORDER — IBUPROFEN 600 MG PO TABS
600.0000 mg | ORAL_TABLET | Freq: Four times a day (QID) | ORAL | Status: DC
  Administered 2022-01-18 – 2022-01-20 (×8): 600 mg via ORAL
  Filled 2022-01-18 (×8): qty 1

## 2022-01-18 MED ORDER — TRANEXAMIC ACID-NACL 1000-0.7 MG/100ML-% IV SOLN
INTRAVENOUS | Status: AC
  Filled 2022-01-18: qty 100

## 2022-01-18 MED ORDER — INSULIN ASPART 100 UNIT/ML IJ SOLN
0.0000 [IU] | Freq: Every day | INTRAMUSCULAR | Status: DC
  Administered 2022-01-18 – 2022-01-19 (×2): 2 [IU] via SUBCUTANEOUS

## 2022-01-18 MED ORDER — FENTANYL-BUPIVACAINE-NACL 0.5-0.125-0.9 MG/250ML-% EP SOLN
12.0000 mL/h | EPIDURAL | Status: DC | PRN
  Administered 2022-01-18: 12 mL/h via EPIDURAL
  Filled 2022-01-18: qty 250

## 2022-01-18 MED ORDER — PRENATAL MULTIVITAMIN CH
1.0000 | ORAL_TABLET | Freq: Every day | ORAL | Status: DC
  Administered 2022-01-19 – 2022-01-20 (×2): 1 via ORAL
  Filled 2022-01-18 (×2): qty 1

## 2022-01-18 MED ORDER — FENTANYL CITRATE (PF) 100 MCG/2ML IJ SOLN
100.0000 ug | Freq: Once | INTRAMUSCULAR | Status: AC
  Administered 2022-01-18: 100 ug via INTRAVENOUS

## 2022-01-18 MED ORDER — DIPHENHYDRAMINE HCL 25 MG PO CAPS: 25.0000 mg | ORAL_CAPSULE | Freq: Four times a day (QID) | ORAL | Status: DC | PRN

## 2022-01-18 MED ORDER — EPHEDRINE 5 MG/ML INJ: 10.0000 mg | INTRAVENOUS | Status: DC | PRN

## 2022-01-18 MED ORDER — COCONUT OIL OIL
1.0000 "application " | TOPICAL_OIL | Status: DC | PRN
  Administered 2022-01-20: 1 via TOPICAL

## 2022-01-18 MED ORDER — WITCH HAZEL-GLYCERIN EX PADS: 1.0000 "application " | MEDICATED_PAD | CUTANEOUS | Status: DC | PRN

## 2022-01-18 MED ORDER — OXYTOCIN-SODIUM CHLORIDE 30-0.9 UT/500ML-% IV SOLN: 2.5000 [IU]/h | INTRAVENOUS | Status: DC | PRN

## 2022-01-18 MED ORDER — ONDANSETRON HCL 4 MG PO TABS: 4.0000 mg | ORAL_TABLET | ORAL | Status: DC | PRN

## 2022-01-18 MED ORDER — ZOLPIDEM TARTRATE 5 MG PO TABS: 5.0000 mg | ORAL_TABLET | Freq: Every evening | ORAL | Status: DC | PRN

## 2022-01-18 MED ORDER — METHYLERGONOVINE MALEATE 0.2 MG/ML IJ SOLN
0.2000 mg | Freq: Once | INTRAMUSCULAR | Status: AC
  Administered 2022-01-18: 0.2 mg via INTRAMUSCULAR

## 2022-01-18 NOTE — Anesthesia Preprocedure Evaluation (Signed)
Anesthesia Evaluation  ?Patient identified by MRN, date of birth, ID band ?Patient awake ? ? ? ?Reviewed: ?Allergy & Precautions, H&P , Patient's Chart, lab work & pertinent test results ? ?Airway ?Mallampati: III ? ?TM Distance: >3 FB ?Neck ROM: full ? ? ? Dental ?no notable dental hx. ?(+) Teeth Intact ?  ?Pulmonary ?neg pulmonary ROS,  ?  ?Pulmonary exam normal ?breath sounds clear to auscultation ? ? ? ? ? ? Cardiovascular ?hypertension, Pt. on medications ?Normal cardiovascular exam ?Rhythm:regular Rate:Normal ? ? ?  ?Neuro/Psych ?negative neurological ROS ? negative psych ROS  ? GI/Hepatic ?negative GI ROS, Neg liver ROS,   ?Endo/Other  ?negative endocrine ROSdiabetes, Type 2, Oral Hypoglycemic Agents, Insulin Dependent ? Renal/GU ?negative Renal ROS  ?negative genitourinary ?  ?Musculoskeletal ?negative musculoskeletal ROS ?(+)  ? Abdominal ?(+) + obese,   ?Peds ? Hematology ? ?(+) Blood dyscrasia, anemia ,   ?Anesthesia Other Findings ? ? Reproductive/Obstetrics ?(+) Pregnancy ? ?  ? ? ? ? ? ? ? ? ? ? ? ? ? ?  ?  ? ? ? ? ? ? ? ? ?Anesthesia Physical ?Anesthesia Plan ? ?ASA: 3 ? ?Anesthesia Plan: Epidural  ? ?Post-op Pain Management:   ? ?Induction:  ? ?PONV Risk Score and Plan: Treatment may vary due to age or medical condition ? ?Airway Management Planned: Natural Airway ? ?Additional Equipment: None ? ?Intra-op Plan:  ? ?Post-operative Plan:  ? ?Informed Consent: I have reviewed the patients History and Physical, chart, labs and discussed the procedure including the risks, benefits and alternatives for the proposed anesthesia with the patient or authorized representative who has indicated his/her understanding and acceptance.  ? ? ? ? ? ?Plan Discussed with: Anesthesiologist ? ?Anesthesia Plan Comments:   ? ? ? ? ? ? ?Anesthesia Quick Evaluation ? ?

## 2022-01-18 NOTE — Lactation Note (Signed)
This note was copied from a baby's chart. ?Lactation Consultation Note ? ?Patient Name: Jill Montoya ?Today's Date: 01/18/2022 ?Reason for consult: Initial assessment;Late-preterm 34-36.6wks;Breastfeeding assistance;Maternal endocrine disorder;Other (Comment) (Hypoglycemia) ?Age:26 hours ?Infant had a low glucose x1. LC assisted giving 2 ml of colostrum on a spoon followed by latch for 8 min. RN, Irma Newness giving gel.  ? ?LPTI guidelines reviewed to reduce calorie loss including keeping total feeding under 30 min.  ? ?Mom set up on DEBP q 3hrs for 15 min.  ? ?Plan 1. To feed based on cues 8-12x 24hr period. Mom to offer breasts and look for signs of milk transfer.  ?2. Mom to supplement with EBM with pace bottle feeding and slow flow nipple 5-10 ml per feeding.  ?3. DEBP as stated above.  ?4. I and O sheet reviewed.  ?All questions answered at the end of the visit.  ?Maternal Data ?Has patient been taught Hand Expression?: Yes ?Does the patient have breastfeeding experience prior to this delivery?: Yes ?How long did the patient breastfeed?: 2 weeks pumped and bottle fed infant in NICU, then able to infant to latch at breast until 39 months of age ? ?Feeding ?Mother's Current Feeding Choice: Breast Milk ? ?LATCH Score ?Latch: Repeated attempts needed to sustain latch, nipple held in mouth throughout feeding, stimulation needed to elicit sucking reflex. ? ?Audible Swallowing: Spontaneous and intermittent ? ?Type of Nipple: Flat (but will evert with stimulation) ? ?Comfort (Breast/Nipple): Soft / non-tender ? ?Hold (Positioning): Assistance needed to correctly position infant at breast and maintain latch. ? ?LATCH Score: 7 ? ? ?Lactation Tools Discussed/Used ?Tools: Pump;Flanges ?Flange Size: 21 ?Breast pump type: Double-Electric Breast Pump ?Pump Education: Setup, frequency, and cleaning;Milk Storage ?Reason for Pumping: increase stimulation ?Pumping frequency: every 3 hrs for 15 min ? ?Interventions ?Interventions:  Breast feeding basics reviewed;Assisted with latch;Skin to skin;Breast massage;Hand express;Breast compression;Adjust position;Support pillows;Position options;Expressed milk;Shells;DEBP;Education;Pace feeding;LC Psychologist, educational;Infant Driven Feeding Algorithm education ? ?Discharge ?Pump: Personal;DEBP ?WIC Program: Yes ? ?Consult Status ?Consult Status: Follow-up ?Date: 01/19/22 ?Follow-up type: In-patient ? ? ? ?Corin Formisano  Nicholson-Springer ?01/18/2022, 2:41 PM ? ? ? ?

## 2022-01-18 NOTE — Progress Notes (Signed)
Subjective:   ? ?Coping well w/ ctx. Reports increase in discomfort, but denies need for intervention. Family remains supportive at bedside.  ? ?Objective:   ? ?VS: BP 107/63   Pulse (!) 106   Temp 97.9 ?F (36.6 ?C) (Oral)   Resp 18   Ht 5\' 4"  (1.626 m)   Wt 104.9 kg   LMP 05/10/2021   BMI 39.69 kg/m?  ?FHR : baseline 135 / variability moderate / accelerations present / absent decelerations ?Toco: contractions every 2-4 minutes  ?Membranes: intact ?Dilation: 5 ?Effacement (%): 70 ?Station: -2 ?Presentation: Vertex ?Exam by:: [email or web address], CNM ?Pitocin 10 mU/min ? ?CBG (last 3)  ?Recent Labs  ?  01/18/22 ?0023 01/18/22 ?0123 01/18/22 ?03/20/22  ?GLUCAP 111* 102* 106*  ? ? ? ?Assessment/Plan:  ? ?26 y.o. 22 [redacted]w[redacted]d ?IOL for poorly managed T2DM ? ? ?Labor: Progressing on Pitocin, will continue to increase then AROM ?T2DM:   managed with Endo Tool ?Fetal Wellbeing:  Category I ?Pain Control:   plans IV sedation and epidural ?I/D:   GBS neg ?Anticipated MOD:  NSVD ? ?[redacted]w[redacted]d MSN, CNM ?01/18/2022 4:11 AM ? ?

## 2022-01-18 NOTE — H&P (Signed)
HISTORY & PHYSICAL - AS WRITTEN BY DR. Richardson Dopp 01/16/22 - NO UPDATES ? ?Jill Montoya is a 26 y.o. female [phone] at 36 weeks and 0 days  presenting for induction of labor due to poorly controlled Type 2 DM. Pt has been followed by MFM. She saw Dr. Denyse Amass booker on 01/16/2022 reporting continued poorly controlled type 2 DM. Recent admission for fetal heart rate decelerations that resolved during observation. Dr. Grace Bushy recommends delivery at [redacted] wks EGA. 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 Prenatal care provided by Dr. Gerald Leitz with [facility] ?  ?U/S 03/03/ 2023.  EFW 8 lbs 5 oz >99%ile .. placenta is posterior BPP10/10 ?  ?OB History   ?  ?  Gravida  ?3  ? Para  ?1  ? Term  ?   ? Preterm  ?1  ? AB  ?1  ? Living  ?1  ?  ?  ?  SAB  ?1  ? IAB  ?   ? Ectopic  ?   ? Multiple  ?   ? Live Births  ?   ?    ?  ?   ?  ?    ?Past Medical History:  ?Diagnosis Date  ? Chlamydia    ? Diabetes mellitus without complication (HCC)    ? HSV (herpes simplex virus) infection    ?  ?     ?Past Surgical History:  ?Procedure Laterality Date  ? NO PAST SURGERIES      ?  ?Family History: family history includes Aneurysm in her paternal grandmother; Diabetes in her father and mother. ?Social History:  reports that she has never smoked. She has never used smokeless tobacco. She reports that she does not drink alcohol and does not use drugs. ?  ?Medications: 26 units of humalog with meals / Basaglar 20 units qhs ?Vatlrex  ?PNV  ?Aspirin 81 mg  ?  ?Allergies: Nasonex  ?  ?   ?Maternal Diabetes: Yes:  Diabetes Type:  Pre-pregnancy ?Genetic Screening: Normal ?Maternal Ultrasounds/Referrals: Normal ?Fetal Ultrasounds or other Referrals:  Fetal echo ?Maternal Substance Abuse:  No ?Significant Maternal Medications:  Meds include: Other:  ?Significant Maternal Lab Results:  Group B Strep negative ?Other Comments:  None ?  ?Review of Systems  ?Constitutional: Negative.   ?HENT: Negative.    ?Eyes:  Negative.   ?Respiratory: Negative.    ?Cardiovascular: Negative.   ?Gastrointestinal: Negative.   ?Endocrine: Negative.   ?Genitourinary: Negative.   ?Musculoskeletal: Negative.   ?Skin: Negative.   ?Allergic/Immunologic: Negative.   ?Neurological: Negative.   ?Hematological: Negative.   ?Psychiatric/Behavioral: Negative.    ?History ?Last menstrual period 05/10/2021, unknown if currently breastfeeding. ?Maternal Exam:  ?Introitus: Normal vulva.  ?Physical Exam ?Vitals reviewed.  ?Constitutional:   ?   Appearance: Normal appearance.  ?HENT:  ?   Head: Normocephalic and atraumatic.  ?   Nose: Nose normal.  ?   Mouth/Throat:  ?   Mouth: Mucous membranes are moist.  ?Eyes:  ?   Pupils: Pupils are equal, round, and reactive to light.  ?Cardiovascular:  ?   Rate and Rhythm: Normal rate and regular rhythm.  ?   Pulses: Normal pulses.  ?   Heart sounds: Normal heart sounds.  ?Pulmonary:  ?   Effort: Pulmonary effort is normal.  ?   Breath sounds: Normal breath sounds.  ?Abdominal:  ?   Tenderness: There is no abdominal tenderness.  ?Genitourinary: ?  General: Normal vulva.  ?   Comments: Cervix 3.5 cm/ 75/-2 cephalic  ?Musculoskeletal:     ?   General: Swelling present. Normal range of motion.  ?   Cervical back: Normal range of motion and neck supple.  ?Skin: ?   General: Skin is warm and dry.  ?Neurological:  ?   General: No focal deficit present.  ?   Mental Status: She is alert and oriented to person, place, and time.  ?Psychiatric:     ?   Mood and Affect: Mood normal.     ?   Behavior: Behavior normal.  ?  ?Prenatal labs: ?ABO, Rh: --/--/B POS (02/24 1558) ?Antibody: NEG (02/24 1558) ?Rubella:   ?RPR: NON REACTIVE (02/24 1558)  ?HBsAg:   ?HIV:   ?GBS:   Negative 01/09/2022 ?  ?Assessment/Plan: ?36 weeks and 0 days with poorly controlled Type 2 diabetes.  ?Admitted for induction per recommendation of Dr. Daisy Floro with mFM ?- Pitocin IV  ?- Endotool as needed  ?HSV infection - no active lesions  ?Anticipate SVD   ?CCOB covering 01/17/2022     ?  ?Gerald Leitz ?01/16/2022, 9:59 PM ? ?As above.  ? ?Essie Hart ? ?

## 2022-01-18 NOTE — Anesthesia Procedure Notes (Signed)
Epidural ?Patient location during procedure: OB ?Start time: 01/18/2022 8:54 AM ?End time: 01/18/2022 9:04 AM ? ?Staffing ?Anesthesiologist: Merlinda Frederick, MD ?Performed: anesthesiologist  ? ?Preanesthetic Checklist ?Completed: patient identified, IV checked, site marked, risks and benefits discussed, monitors and equipment checked, pre-op evaluation and timeout performed ? ?Epidural ?Patient position: sitting ?Prep: DuraPrep ?Patient monitoring: heart rate, cardiac monitor, continuous pulse ox and blood pressure ?Approach: midline ?Location: L2-L3 ?Injection technique: LOR saline ? ?Needle:  ?Needle type: Tuohy  ?Needle gauge: 17 G ?Needle length: 9 cm ?Needle insertion depth: 7 cm ?Catheter type: closed end flexible ?Catheter size: 20 Guage ?Catheter at skin depth: 12 cm ?Test dose: negative and Other ? ?Assessment ?Events: blood not aspirated, injection not painful, no injection resistance and negative IV test ? ?Additional Notes ?Informed consent obtained prior to proceeding including risk of failure, 1% risk of PDPH, risk of minor discomfort and bruising.  Discussed rare but serious complications including epidural abscess, permanent nerve injury, epidural hematoma.  Discussed alternatives to epidural analgesia and patient desires to proceed.  Timeout performed pre-procedure verifying patient name, procedure, and platelet count.  Patient tolerated procedure well. ? ? ? ? ?

## 2022-01-18 NOTE — Progress Notes (Signed)
Subjective:   ? ?Continues to cope with contractions. Denies need for pain medication, but is aware of all options. Discussed amniotomy and pt agrees.  ? ?Objective:   ? ?VS: BP (!) 114/55   Pulse 96   Temp 97.9 ?F (36.6 ?C) (Oral)   Resp 18   Ht 5\' 4"  (1.626 m)   Wt 104.9 kg   LMP 05/10/2021   BMI 39.69 kg/m?  ?FHR : baseline 125 / variability moderate / accelerations present / absent decelerations ?Toco: contractions every 2-4 minutes  ?Membranes: AROM, clear ?Dilation: 6 ?Effacement (%): 70 ?Station: -2 ?Presentation: Vertex ?Exam by:: [email or web address], CNM ?Pitocin 14 mU/min ? ?CBG (last 3)  ?Recent Labs  ?  01/18/22 ?0123 01/18/22 ?0324 01/18/22 ?03/20/22  ?GLUCAP 102* 106* 114*  ? ? ? ?Assessment/Plan:  ? ?26 y.o. 22 [redacted]w[redacted]d ? ?IOL for poorly managed T2DM ?   ?Labor: Progressing on Pitocin, will continue to increase then AROM ?T2DM:   managed with Endo Tool ?Fetal Wellbeing:  Category I ?Pain Control:   plans IV sedation and epidural ?I/D:   GBS neg ?Anticipated MOD:  NSVD ? ?[redacted]w[redacted]d MSN, CNM ?01/18/2022 6:39 AM ? ?

## 2022-01-19 LAB — CBC
HCT: 32 % — ABNORMAL LOW (ref 36.0–46.0)
Hemoglobin: 10.9 g/dL — ABNORMAL LOW (ref 12.0–15.0)
MCH: 31.3 pg (ref 26.0–34.0)
MCHC: 34.1 g/dL (ref 30.0–36.0)
MCV: 92 fL (ref 80.0–100.0)
Platelets: 312 10*3/uL (ref 150–400)
RBC: 3.48 MIL/uL — ABNORMAL LOW (ref 3.87–5.11)
RDW: 12.9 % (ref 11.5–15.5)
WBC: 13.6 10*3/uL — ABNORMAL HIGH (ref 4.0–10.5)
nRBC: 0 % (ref 0.0–0.2)

## 2022-01-19 LAB — GLUCOSE, CAPILLARY
Glucose-Capillary: 123 mg/dL — ABNORMAL HIGH (ref 70–99)
Glucose-Capillary: 131 mg/dL — ABNORMAL HIGH (ref 70–99)
Glucose-Capillary: 113 mg/dL — ABNORMAL HIGH (ref 70–99)
Glucose-Capillary: 165 mg/dL — ABNORMAL HIGH (ref 70–99)
Glucose-Capillary: 207 mg/dL — ABNORMAL HIGH (ref 70–99)

## 2022-01-19 LAB — RUBELLA SCREEN: Rubella: 2.35 index (ref >0.99)

## 2022-01-19 MED ORDER — INSULIN DETEMIR 100 UNIT/ML ~~LOC~~ SOLN
18.0000 [IU] | SUBCUTANEOUS | Status: DC
  Administered 2022-01-19: 18 [IU] via SUBCUTANEOUS
  Filled 2022-01-19 (×2): qty 0.18

## 2022-01-19 NOTE — Anesthesia Postprocedure Evaluation (Signed)
Anesthesia Post Note ? ?Patient: Jill Montoya ? ?Procedure(s) Performed: AN AD HOC LABOR EPIDURAL ? ?  ? ?Patient location during evaluation: Mother Baby ?Anesthesia Type: Epidural ?Level of consciousness: awake, oriented and awake and alert ?Pain management: pain level controlled ?Vital Signs Assessment: post-procedure vital signs reviewed and stable ?Respiratory status: spontaneous breathing, respiratory function stable and nonlabored ventilation ?Cardiovascular status: stable ?Postop Assessment: no headache, adequate PO intake, patient able to bend at knees, able to ambulate, no apparent nausea or vomiting and no backache ?Anesthetic complications: no ? ? ?No notable events documented. ? ?Last Vitals:  ?Vitals:  ? 01/19/22 0038 01/19/22 0539  ?BP: 107/61 107/71  ?Pulse: 95   ?Resp: 18 18  ?Temp: 36.8 ?C 36.8 ?C  ?SpO2: 99% 99%  ?  ?Last Pain:  ?Vitals:  ? 01/19/22 0745  ?TempSrc:   ?PainSc: 0-No pain  ? ?Pain Goal:   ? ?  ?  ?  ?  ?  ?  ?  ? ?Atiba Kimberlin ? ? ? ? ?

## 2022-01-19 NOTE — Progress Notes (Signed)
Post Partum Day 1 ?Subjective: ?no complaints, up ad lib, voiding, tolerating PO, and + flatus ? ?Objective: ?Blood pressure 107/71, pulse 95, temperature 98.2 ?F (36.8 ?C), temperature source Oral, resp. rate 18, height 5\' 4"  (1.626 m), weight 104.9 kg, last menstrual period 05/10/2021, SpO2 99 %, unknown if currently breastfeeding. ? ?Physical Exam:  ?General: alert, cooperative, and no distress ?Lochia: appropriate ?Uterine Fundus: firm ?Incision: NA ?DVT Evaluation: No evidence of DVT seen on physical exam. ? ?Recent Labs  ?  01/17/22 ?1852 01/19/22 ?0419  ?HGB 11.4* 10.9*  ?HCT 34.2* 32.0*  ? ?Results for orders placed or performed during the hospital encounter of 01/17/22 (from the past 24 hour(s))  ?Glucose, capillary     Status: Abnormal  ? Collection Time: 01/18/22  5:01 PM  ?Result Value Ref Range  ? Glucose-Capillary 207 (H) 70 - 99 mg/dL  ? Comment 1 Notify RN   ? Comment 2 Document in Chart   ?Glucose, capillary     Status: Abnormal  ? Collection Time: 01/18/22  9:13 PM  ?Result Value Ref Range  ? Glucose-Capillary 206 (H) 70 - 99 mg/dL  ?CBC     Status: Abnormal  ? Collection Time: 01/19/22  4:19 AM  ?Result Value Ref Range  ? WBC 13.6 (H) 4.0 - 10.5 K/uL  ? RBC 3.48 (L) 3.87 - 5.11 MIL/uL  ? Hemoglobin 10.9 (L) 12.0 - 15.0 g/dL  ? HCT 32.0 (L) 36.0 - 46.0 %  ? MCV 92.0 80.0 - 100.0 fL  ? MCH 31.3 26.0 - 34.0 pg  ? MCHC 34.1 30.0 - 36.0 g/dL  ? RDW 12.9 11.5 - 15.5 %  ? Platelets 312 150 - 400 K/uL  ? nRBC 0.0 0.0 - 0.2 %  ?Glucose, capillary     Status: Abnormal  ? Collection Time: 01/19/22  6:56 AM  ?Result Value Ref Range  ? Glucose-Capillary 123 (H) 70 - 99 mg/dL  ?Glucose, capillary     Status: Abnormal  ? Collection Time: 01/19/22  8:29 AM  ?Result Value Ref Range  ? Glucose-Capillary 131 (H) 70 - 99 mg/dL  ?Glucose, capillary     Status: Abnormal  ? Collection Time: 01/19/22 12:40 PM  ?Result Value Ref Range  ? Glucose-Capillary 113 (H) 70 - 99 mg/dL  ?  ?Assessment/Plan: ?Plan for discharge  tomorrow, Breastfeeding, and Circumcision prior to discharge ?Pt desires circumcision of newborn prior to discharge. R/B/A of circumcision discussed with patient  including but not limited to infection bleeding, poor cosmesis with the need for futher surgery. she voiced understanding and desires to proceed .  ? ?Type 2 DM .--improved since delivery ...  diabetic educator consult today.  ? ? LOS: 2 days  ? ?03/21/22 ?01/19/2022, 1:30 PM  ? ? ?

## 2022-01-19 NOTE — Progress Notes (Signed)
Inpatient Diabetes Program Recommendations ? ?AACE/ADA: New Consensus Statement on Inpatient Glycemic Control (2015) ? ?Target Ranges:  Prepandial:   less than 140 mg/dL ?     Peak postprandial:   less than 180 mg/dL (1-2 hours) ?     Critically ill patients:  140 - 180 mg/dL  ? ?Lab Results  ?Component Value Date  ? GLUCAP 131 (H) 01/19/2022  ? HGBA1C 6.9 (A) 12/25/2021  ? ? ?Review of Glycemic Control ? Latest Reference Range & Units 01/18/22 21:13 01/19/22 06:56 01/19/22 08:29  ?Glucose-Capillary 70 - 99 mg/dL 478 (H) 295 (H) 621 (H)  ?(H): Data is abnormally high ?Diabetes history: Type 2 DM ?Outpatient Diabetes medications: Prior to Preg: [place]ison: Humalog 12 units TID ?In preg: Basaglar 20 units QHS, Humalog 26 units TID ?Current orders for Inpatient glycemic control: Novolog 0-20 units TID & HS, Levemir 14 units QD ? ?Inpatient Diabetes Program Recommendations:   ? ?Consider increasing Levemir to 18 units QD.  ? ?Thanks, ?Lujean Rave, MSN, RNC-OB ?Diabetes Coordinator ?[phone] (8a-5p) ? ? ? ?

## 2022-01-19 NOTE — Lactation Note (Signed)
This note was copied from a baby's chart. ?Lactation Consultation Note ? ?Patient Name: Boy Arrilla Armato ?Today's Date: 01/19/2022 ?  ?Age:26 hours ? ?LC went in to see how breastfeeding and pumping coming along. Mom in the shower. Dad present and will have Mom call for [facility] services with next latch.  ? ? ?Maternal Data ?  ? ?Feeding ?  ? ?LATCH Score ?Latch: Grasps breast easily, tongue down, lips flanged, rhythmical sucking. ? ?Audible Swallowing: A few with stimulation ? ?Type of Nipple: Everted at rest and after stimulation ? ?Comfort (Breast/Nipple): Soft / non-tender ? ?Hold (Positioning): No assistance needed to correctly position infant at breast. ? ?LATCH Score: 9 ? ? ?Lactation Tools Discussed/Used ?  ? ?Interventions ?  ? ?Discharge ?  ? ?Consult Status ?  ? ? ? ?Eulon Allnutt  Nicholson-Springer ?01/19/2022, 8:18 PM ? ? ? ?

## 2022-01-20 ENCOUNTER — Other Ambulatory Visit (HOSPITAL_COMMUNITY): Payer: Self-pay

## 2022-01-20 LAB — GLUCOSE, CAPILLARY
Glucose-Capillary: 108 mg/dL — ABNORMAL HIGH (ref 70–99)
Glucose-Capillary: 86 mg/dL (ref 70–99)

## 2022-01-20 LAB — SURGICAL PATHOLOGY

## 2022-01-20 MED ORDER — IBUPROFEN 600 MG PO TABS
600.0000 mg | ORAL_TABLET | Freq: Four times a day (QID) | ORAL | 0 refills | Status: DC | PRN
  Filled 2022-01-20: qty 30, 8d supply, fill #0

## 2022-01-20 MED ORDER — INSULIN LISPRO (1 UNIT DIAL) 100 UNIT/ML (KWIKPEN)
10.0000 [IU] | PEN_INJECTOR | Freq: Three times a day (TID) | SUBCUTANEOUS | 11 refills | Status: AC
  Filled 2022-01-20: qty 9, 30d supply, fill #0

## 2022-01-20 MED ORDER — INSULIN GLARGINE 100 UNIT/ML SOLOSTAR PEN
18.0000 [IU] | PEN_INJECTOR | Freq: Every day | SUBCUTANEOUS | 11 refills | Status: AC
  Filled 2022-01-20: qty 9, 50d supply, fill #0

## 2022-01-20 MED ORDER — ACETAMINOPHEN 325 MG PO TABS
650.0000 mg | ORAL_TABLET | ORAL | Status: DC | PRN
Start: 2022-01-20 — End: 2022-04-24

## 2022-01-20 MED ORDER — INSULIN PEN NEEDLE 32G X 4 MM MISC
11 refills | Status: AC
  Filled 2022-01-20: qty 100, 25d supply, fill #0

## 2022-01-20 NOTE — Discharge Summary (Signed)
? ?  Postpartum Discharge Summary ? ?Date of Service updated 01/20/2022 ? ?   ?Patient Name: Jill Montoya ?DOB: Sep 25, 1996 ?MRN: [id number] ? ?Date of admission: 01/17/2022 ?Delivery date:01/18/2022  ?Delivering provider: Sanjuana Kava  ?Date of discharge: 01/20/2022 ? ?Admitting diagnosis: Type 2 diabetes mellitus affecting pregnancy in third trimester, antepartum [O24.113] ?Intrauterine pregnancy: [redacted]w[redacted]d    ?Secondary diagnosis:  Principal Problem: ?  Type 2 diabetes mellitus affecting pregnancy in third trimester, antepartum ? ?Additional problems: None    ?Discharge diagnosis: Preterm Pregnancy Delivered and Type 2 DM                                              ?Post partum procedures: None ?Augmentation: AROM and Pitocin ?Complications: None ? ?Hospital course: Induction of Labor With Vaginal Delivery   ?26y.o. yo [id number] admitted to the hospital 01/17/2022 for induction of labor.  Indication for induction:  severely uncontrolled type 2 diabetes  .  Patient had an uncomplicated labor course as follows: ?Membrane Rupture Time/Date: 6:16 AM ,01/18/2022   ?Delivery Method:Vaginal, Spontaneous  ?Episiotomy: Left Mediolateral  ?Lacerations:  2nd degree  ?Details of delivery can be found in separate delivery note.  Patient had a routine postpartum course. Patient is discharged home 01/20/22. ? ?Newborn Data: ?Birth date:01/18/2022  ?Birth time:11:04 AM  ?Gender:Female  ?Living status:Living  ?Apgars:8 ,9  ?WeEYCXKG:[id number]  ? ?Magnesium Sulfate received: No ?BMZ received: No ?Rhophylac:N/A ?MMR:N/A ?T-DaP:Given prenatally ?Flu: N/A ?Transfusion:No ? ?Physical exam  ?Vitals:  ? 01/19/22 0539 01/19/22 1620 01/19/22 2037 01/20/22 0531  ?BP: 107/71 102/66 112/67 109/65  ?Pulse:  96 98 81  ?Resp: 18 20 18 18   ?Temp: 98.2 ?F (36.8 ?C) 98.5 ?F (36.9 ?C) 98.8 ?F (37.1 ?C) 98.4 ?F (36.9 ?C)  ?TempSrc: Oral Oral Oral Oral  ?SpO2: 99%  99% 99%  ?Weight:      ?Height:      ? ?General: alert, cooperative, and no distress ?Lochia:  appropriate ?Uterine Fundus: firm ?Incision: N/A ?DVT Evaluation: No evidence of DVT seen on physical exam. ?Labs: ?Lab Results  ?Component Value Date  ? WBC 13.6 (H) 01/19/2022  ? HGB 10.9 (L) 01/19/2022  ? HCT 32.0 (L) 01/19/2022  ? MCV 92.0 01/19/2022  ? PLT 312 01/19/2022  ? ?CMP Latest Ref Rng & Units 01/18/2022  ?Glucose 70 - 99 mg/dL 108(H)  ?BUN 6 - 20 mg/dL <5(L)  ?Creatinine 0.44 - 1.00 mg/dL 0.55  ?Sodium 135 - 145 mmol/L 135  ?Potassium 3.5 - 5.1 mmol/L 3.4(L)  ?Chloride 98 - 111 mmol/L 108  ?CO2 22 - 32 mmol/L 19(L)  ?Calcium 8.9 - 10.3 mg/dL 8.1(L)  ?Total Protein 6.5 - 8.1 g/dL -  ?Total Bilirubin 0.3 - 1.2 mg/dL -  ?Alkaline Phos 38 - 126 U/L -  ?AST 15 - 41 U/L -  ?ALT 0 - 44 U/L -  ? ?Edinburgh Score: ?Edinburgh Postnatal Depression Scale Screening Tool 01/19/2022  ?I have been able to laugh and see the funny side of things. 0  ?I have looked forward with enjoyment to things. 0  ?I have blamed myself unnecessarily when things went wrong. 0  ?I have been anxious or worried for no good reason. 0  ?I have felt scared or panicky for no good reason. 0  ?Things have been getting on top of me. 1  ?I have  been so unhappy that I have had difficulty sleeping. 0  ?I have felt sad or miserable. 0  ?I have been so unhappy that I have been crying. 0  ?The thought of harming myself has occurred to me. 0  ?Edinburgh Postnatal Depression Scale Total 1  ? ? ? ? ?After visit meds:  ?Allergies as of 01/20/2022   ? ?   Reactions  ? Nasonex [mometasone Furoate] Other (See Comments)  ? "My nasal passages close up"  ? ?  ? ?  ?Medication List  ?  ? ?STOP taking these medications   ? ?aspirin EC 81 MG tablet ?  ? ?  ? ?TAKE these medications   ? ?acetaminophen 325 MG tablet ?Commonly known as: Tylenol ?Take 2 tablets (650 mg total) by mouth every 4 (four) hours as needed (for pain scale < 4). ?  ?albuterol 108 (90 Base) MCG/ACT inhaler ?Commonly known as: VENTOLIN HFA ?Inhale 2 puffs into the lungs every 6 (six) hours as  needed. ?  ?freestyle lancets ?Use as instructed ?  ?FREESTYLE LITE test strip ?Generic drug: glucose blood ?Use to check BS 4x a day ?  ?ibuprofen 600 MG tablet ?Commonly known as: ADVIL ?Take 1 tablet (600 mg total) by mouth every 6 (six) hours as needed. ?  ?insulin aspart 100 UNIT/ML injection ?Commonly known as: novoLOG ?Inject 10 Units into the skin 3 (three) times daily before meals. ?  ?insulin detemir 100 UNIT/ML injection ?Commonly known as: LEVEMIR ?Inject 0.18 mLs (18 Units total) into the skin at bedtime. ?  ?prenatal multivitamin Tabs tablet ?Take 1 tablet by mouth daily at 12 noon. ?  ?valACYclovir 500 MG tablet ?Commonly known as: VALTREX ?Take 500 mg by mouth daily. ?  ? ?  ? ? ? ?Discharge home in stable condition ?Infant Feeding: Bottle and Breast ?Infant Disposition:rooming in ?Discharge instruction: per After Visit Summary and Postpartum booklet. ?Activity: Advance as tolerated. Pelvic rest for 6 weeks.  ?Diet: carb modified diet ?Anticipated Birth Control: Unsure ?Postpartum Appointment:6 weeks ?Additional Postpartum F/U:  NA ?Future Appointments:No future appointments. ?Follow up Visit: ? Follow-up Information   ? ? Christophe Louis, MD. Go in 6 week(s).   ?Specialty: Obstetrics and Gynecology ?Contact information: ?[address] ?[address] ?[place] [address] ?[phone] ? ? ?  ?  ? ?  ?  ? ?  ? ? ? ?  ? ?01/20/2022 ?Christophe Louis, MD ? ? ?

## 2022-01-20 NOTE — Lactation Note (Signed)
This note was copied from a baby's chart. ?Lactation Consultation Note ?LC was checking on mom to see how BF was going. ?Mom stated fine. Mom has been pumping and BF also giving DBM as supplement. ?Mom was upset because she didn't have any more DBM for the baby. ?LC went and got a bottle. Spoke w/RN and RN took bottle to mom. ?Mom upset. Will consult later. ? ?Patient Name: Jill Montoya ?Today's Date: 01/20/2022 ?  ?Age:26 hours ? ?Maternal Data ?  ? ?Feeding ?Nipple Type: Slow - flow ? ?LATCH Score ?  ? ?  ? ?  ? ?  ? ?  ? ?  ? ? ?Lactation Tools Discussed/Used ?  ? ?Interventions ?  ? ?Discharge ?  ? ?Consult Status ?  ? ? ? ?Charyl Dancer ?01/20/2022, 3:41 AM ? ? ? ?

## 2022-01-23 ENCOUNTER — Ambulatory Visit: Payer: Medicaid Other

## 2022-01-23 ENCOUNTER — Other Ambulatory Visit: Payer: PRIVATE HEALTH INSURANCE

## 2022-01-28 ENCOUNTER — Telehealth (HOSPITAL_COMMUNITY): Payer: Self-pay | Admitting: *Deleted

## 2022-01-28 NOTE — Telephone Encounter (Signed)
Mom reports feeling good. No concerns about herself at this time. EPDS=1 [facility] score=1) ?Mom reports baby is doing well. Feeding, peeing, and pooping without difficulty. Safe sleep reviewed. Mom reports no concerns about baby at present. ? ?Duffy Rhody, RN 01-28-2022 at 1:27pm ?

## 2022-02-10 ENCOUNTER — Other Ambulatory Visit (HOSPITAL_COMMUNITY): Payer: Self-pay

## 2022-03-10 ENCOUNTER — Other Ambulatory Visit: Payer: Self-pay

## 2022-03-10 DIAGNOSIS — O24819 Other pre-existing diabetes mellitus in pregnancy, unspecified trimester: Secondary | ICD-10-CM

## 2022-03-10 MED ORDER — DEXCOM G6 TRANSMITTER MISC: 3 refills | Status: DC

## 2022-03-10 MED ORDER — DEXCOM G6 RECEIVER DEVI: 0 refills | Status: DC

## 2022-03-10 MED ORDER — DEXCOM G6 SENSOR MISC: 3 refills | Status: DC

## 2022-04-24 ENCOUNTER — Ambulatory Visit (INDEPENDENT_AMBULATORY_CARE_PROVIDER_SITE_OTHER): Payer: PRIVATE HEALTH INSURANCE | Admitting: Internal Medicine

## 2022-04-24 ENCOUNTER — Encounter: Payer: Self-pay | Admitting: Internal Medicine

## 2022-04-24 VITALS — BP 128/86 | HR 105 | Ht 64.0 in | Wt 196.8 lb

## 2022-04-24 DIAGNOSIS — Z794 Long term (current) use of insulin: Secondary | ICD-10-CM

## 2022-04-24 DIAGNOSIS — E119 Type 2 diabetes mellitus without complications: Secondary | ICD-10-CM | POA: Diagnosis not present

## 2022-04-24 DIAGNOSIS — E1165 Type 2 diabetes mellitus with hyperglycemia: Secondary | ICD-10-CM | POA: Insufficient documentation

## 2022-04-24 LAB — POCT GLYCOSYLATED HEMOGLOBIN (HGB A1C): Hemoglobin A1C: 6.8 % — AB (ref 4.0–5.6)

## 2022-04-24 MED ORDER — DEXCOM G7 SENSOR MISC
1.0000 | 3 refills | Status: AC
Start: 2022-04-24 — End: ?

## 2022-04-24 NOTE — Patient Instructions (Signed)
Continue Lantus 18 units daily  Decrease Humalog 8 units with each meal  Humalog correctional insulin: ADD extra units on insulin to your meal-time Humalog  dose if your blood sugars are higher than 170. Use the scale below to help guide you:   Blood sugar before meal Number of units to inject  Less than 170 0 unit  171 -  210 1 units  211 -  250 2 units  251 -  290 3 units  291 -  330 4 units  331 -  370 5 units  371 -  410 6 units     HOW TO TREAT LOW BLOOD SUGARS (Blood sugar LESS THAN 70 MG/DL) Please follow the RULE OF 15 for the treatment of hypoglycemia treatment (when your (blood sugars are less than 70 mg/dL)   STEP 1: Take 15 grams of carbohydrates when your blood sugar is low, which includes:  3-4 GLUCOSE TABS  OR 3-4 OZ OF JUICE OR REGULAR SODA OR ONE TUBE OF GLUCOSE GEL    STEP 2: RECHECK blood sugar in 15 MINUTES STEP 3: If your blood sugar is still low at the 15 minute recheck --> then, go back to STEP 1 and treat AGAIN with another 15 grams of carbohydrates.

## 2022-04-24 NOTE — Progress Notes (Signed)
Name: Marthella Osorno  Age/ Sex: 26 y.o., female   MRN/ DOB: [id number], 1996-01-15     PCP: Renford Dills, MD   Reason for Endocrinology Evaluation: Type 2 Diabetes Mellitus  Initial Endocrine Consultative Visit: 04/14/2018    PATIENT IDENTIFIER: Ms. Dennette Faulconer is a 26 y.o. female with a past medical history of T2DM. The patient has followed with Endocrinology clinic since 04/14/2018 for consultative assistance with management of her diabetes.  DIABETIC HISTORY:  [name] was diagnosed with DM in 2015. She has been on Metformin and Glyburide in the past, started insulin therapy in 2019 following her pregnancy. Trulicity - did not work well. Jardiance - was not responding well. Her hemoglobin A1c has ranged from 5.9% in 2022, peaking at 11.4% in 2022.  Transitioned from Dr. Everardo All 04/2022 SUBJECTIVE:   During the last visit (12/25/2021): saw Dr. Everardo All   Today (04/24/2022): [name] is here for a follow up on diabetes management.  She checks her blood sugars 4 times daily. The patient has  had hypoglycemic episodes since the last clinic visit, which typically occur 1 x / month- most often occuring during the day . The patient is  symptomatic with these episodes   She is  S/P NVD 01/2022 she had hypoglycemia postnatal which ended up in stopping her insulin   She is nursing at this time    Denies N/V and diarrhea   HOME DIABETES REGIMEN:  Lantus 18 units daily  Humalog 10 units TIDQAC    Statin: no ACE-I/ARB: no    METER DOWNLOAD SUMMARY: forgot meter    DIABETIC COMPLICATIONS: Microvascular complications:   Denies: CKD, retinopathy, neuropathy  Last Eye Exam: Completed 02/2022  Macrovascular complications:   Denies: CAD, CVA, PVD   HISTORY:  Past Medical History:  Past Medical History:  Diagnosis Date   Chlamydia    Diabetes mellitus without complication (HCC)    HSV (herpes simplex virus) infection    Past Surgical History:  Past Surgical History:  Procedure  Laterality Date   NO PAST SURGERIES     Social History:  reports that she has never smoked. She has never used smokeless tobacco. She reports that she does not drink alcohol and does not use drugs. Family History:  Family History  Problem Relation Age of Onset   Diabetes Mother    Diabetes Father    Aneurysm Paternal Grandmother      HOME MEDICATIONS: Allergies as of 04/24/2022       Reactions   Nasonex [mometasone Furoate] Other (See Comments)   "My nasal passages close up"        Medication List        Accurate as of April 24, 2022  8:01 AM. If you have any questions, ask your nurse or doctor.          STOP taking these medications    acetaminophen 325 MG tablet Commonly known as: Tylenol Stopped by: Scarlette Shorts, MD   Dexcom G6 Receiver Devi Stopped by: Scarlette Shorts, MD   Dexcom G6 Transmitter Misc Stopped by: Scarlette Shorts, MD   freestyle lancets Stopped by: Scarlette Shorts, MD   FREESTYLE LITE test strip Generic drug: glucose blood Stopped by: Scarlette Shorts, MD   ibuprofen 600 MG tablet Commonly known as: ADVIL Stopped by: Scarlette Shorts, MD   prenatal multivitamin Tabs tablet Stopped by: Scarlette Shorts, MD       TAKE these medications    albuterol 108 (  90 Base) MCG/ACT inhaler Commonly known as: VENTOLIN HFA Inhale 2 puffs into the lungs every 6 (six) hours as needed.   Dexcom G7 Sensor Misc 1 Device by Does not apply route as directed. What changed:  how much to take how to take this when to take this additional instructions Changed by: Scarlette Shorts, MD   insulin glargine 100 UNIT/ML Solostar Pen Commonly known as: LANTUS Inject 18 Units into the skin at bedtime.   insulin lispro 100 UNIT/ML KwikPen Commonly known as: HUMALOG Inject 10 Units into the skin 3 (three) times daily before meals.   Insulin Pen Needle 32G X 4 MM Misc Use to inject inuslin up to 4 times daily.    valACYclovir 500 MG tablet Commonly known as: VALTREX Take 500 mg by mouth daily.         OBJECTIVE:   Vital Signs: BP 128/86 (BP Location: Left Arm, Patient Position: Sitting, Cuff Size: Normal)   Pulse (!) 105   Ht 5\' 4"  (1.626 m)   Wt 196 lb 12.8 oz (89.3 kg)   SpO2 96%   BMI 33.78 kg/m   Wt Readings from Last 3 Encounters:  04/24/22 196 lb 12.8 oz (89.3 kg)  01/17/22 231 lb 3.2 oz (104.9 kg)  01/13/22 230 lb 3.2 oz (104.4 kg)     Exam: General: Pt appears well and is in NAD  Neck: General: Supple without adenopathy. Thyroid: Thyroid size normal.  No goiter or nodules appreciated. No thyroid bruit.  Lungs: Clear with good BS bilat with no rales, rhonchi, or wheezes  Heart: RRR with normal S1 and S2 and no gallops; no murmurs; no rub  Abdomen: Normoactive bowel sounds, soft, nontender, without masses or organomegaly palpable  Extremities: No pretibial edema.   Neuro: MS is good with appropriate affect, pt is alert and Ox3    DM foot exam: 04/24/2022  The skin of the feet is intact without sores or ulcerations. The pedal pulses are 2+ on right and 2+ on left. The sensation is intact to a screening 5.07, 10 gram monofilament bilaterally  DATA REVIEWED:  Lab Results  Component Value Date   HGBA1C 6.8 (A) 04/24/2022   HGBA1C 6.9 (A) 12/25/2021   HGBA1C 6.1 (A) 10/17/2021   Lab Results  Component Value Date   CREATININE 0.55 01/18/2022     ASSESSMENT / PLAN / RECOMMENDATIONS:   1) Type 2 Diabetes Mellitus, Optimally controlled, With out complications - Most recent A1c of 6.8 %. Goal A1c < 7.0 %.     - A1c remains at goal  - No glucose data today  - Will reduce prandial insulin based on memory recall of BG 78 mg/dL at supper time  - She will provided with a correction factor - Dexcom faxed to better living in 03/20/2022  - We discussed benefits of GLP-1A and SGLT-2 I with the pt pt, will consider after cessation of breast feeding    MEDICATIONS: Continue  Lantus 18 units daily  Decrease Humalog 8 units TIDQAC CF: Humalog (BG-130/40)   EDUCATION / INSTRUCTIONS: BG monitoring instructions: Patient is instructed to check her blood sugars 3 times a day, before meals . Call [facility] if: BG persistently < 70  I reviewed the Rule of 15 for the treatment of hypoglycemia in detail with the patient. Literature supplied.    2) Diabetic complications:  Eye: Does not have known diabetic retinopathy.  Neuro/ Feet: Does not have known diabetic peripheral neuropathy .  Renal: Patient  does not have known baseline CKD. She   is not on an ACEI/ARB at present   F/U in 4 months     Signed electronically by: Lyndle HerrlichAbby Jaralla Ticia Virgo, MD  [facility]  [facility] [address] [place], [place] [address] Phone: [phone] FAX: [phone]   CC: Renford DillsPolite, Ronald, MD [address] [place] [address] Phone: [phone]  Fax: [phone]  Return to Endocrinology clinic as below: No future appointments.

## 2022-08-27 ENCOUNTER — Ambulatory Visit: Payer: PRIVATE HEALTH INSURANCE | Admitting: Internal Medicine

## 2022-08-27 NOTE — Progress Notes (Deleted)
Name: Jill Montoya  Age/ Sex: 27 y.o., female   MRN/ DOB: [id number], [birth year]     PCP: Renford Dills, MD   Reason for Endocrinology Evaluation: Type 2 Diabetes Mellitus  Initial Endocrine Consultative Visit: 04/14/2018    PATIENT IDENTIFIER: [name] is a 26 y.o. female with a past medical history of T2DM. The patient has followed with Endocrinology clinic since 04/14/2018 for consultative assistance with management of her diabetes.  DIABETIC HISTORY:  [name] was diagnosed with DM in 2015. She has been on Metformin and Glyburide in the past, started insulin therapy in 2019 following her pregnancy. Trulicity - did not work well. Jardiance - was not responding well. Her hemoglobin A1c has ranged from 5.9% in 2022, peaking at 11.4% in 2022.  Transitioned from Dr. Everardo All 04/2022  She is  S/P NVD 01/2022   SUBJECTIVE:   During the last visit (04/24/2022): A1c 6.8%   Today (08/27/2022): [name] is here for a follow up on diabetes management.  She checks her blood sugars 4 times daily. The patient has  had hypoglycemic episodes since the last clinic visit, which typically occur 1 x / month- most often occuring during the day . The patient is  symptomatic with these episodes   She is  S/P NVD 01/2022 she had hypoglycemia postnatal which ended up in stopping her insulin   She is nursing at this time    Denies N/V and diarrhea   HOME DIABETES REGIMEN:  Lantus 18 units daily  Humalog 8 units TIDQAC CF: Humalog (BG-130/40)    Statin: no ACE-I/ARB: no    METER DOWNLOAD SUMMARY: forgot meter    DIABETIC COMPLICATIONS: Microvascular complications:   Denies: CKD, retinopathy, neuropathy  Last Eye Exam: Completed 02/2022  Macrovascular complications:   Denies: CAD, CVA, PVD   HISTORY:  Past Medical History:  Past Medical History:  Diagnosis Date   Chlamydia    Diabetes mellitus without complication (HCC)    HSV (herpes simplex virus) infection    Past  Surgical History:  Past Surgical History:  Procedure Laterality Date   NO PAST SURGERIES     Social History:  reports that she has never smoked. She has never used smokeless tobacco. She reports that she does not drink alcohol and does not use drugs. Family History:  Family History  Problem Relation Age of Onset   Diabetes Mother    Diabetes Father    Aneurysm Paternal Grandmother      HOME MEDICATIONS: Allergies as of 08/27/2022       Reactions   Nasonex [mometasone Furoate] Other (See Comments)   "My nasal passages close up"        Medication List        Accurate as of August 27, 2022  7:10 AM. If you have any questions, ask your nurse or doctor.          albuterol 108 (90 Base) MCG/ACT inhaler Commonly known as: VENTOLIN HFA Inhale 2 puffs into the lungs every 6 (six) hours as needed.   Dexcom G7 Sensor Misc 1 Device by Does not apply route as directed.   insulin glargine 100 UNIT/ML Solostar Pen Commonly known as: LANTUS Inject 18 Units into the skin at bedtime.   insulin lispro 100 UNIT/ML KwikPen Commonly known as: HUMALOG Inject 10 Units into the skin 3 (three) times daily before meals.   Insulin Pen Needle 32G X 4 MM Misc Use to inject inuslin up to 4 times daily.  valACYclovir 500 MG tablet Commonly known as: VALTREX Take 500 mg by mouth daily.         OBJECTIVE:   Vital Signs: There were no vitals taken for this visit.  Wt Readings from Last 3 Encounters:  04/24/22 196 lb 12.8 oz (89.3 kg)  01/17/22 231 lb 3.2 oz (104.9 kg)  01/13/22 230 lb 3.2 oz (104.4 kg)     Exam: General: Pt appears well and is in NAD  Neck: General: Supple without adenopathy. Thyroid: Thyroid size normal.  No goiter or nodules appreciated. No thyroid bruit.  Lungs: Clear with good BS bilat with no rales, rhonchi, or wheezes  Heart: RRR with normal S1 and S2 and no gallops; no murmurs; no rub  Abdomen: Normoactive bowel sounds, soft, nontender, without  masses or organomegaly palpable  Extremities: No pretibial edema.   Neuro: MS is good with appropriate affect, pt is alert and Ox3    DM foot exam: 04/24/2022  The skin of the feet is intact without sores or ulcerations. The pedal pulses are 2+ on right and 2+ on left. The sensation is intact to a screening 5.07, 10 gram monofilament bilaterally  DATA REVIEWED:  Lab Results  Component Value Date   HGBA1C 6.8 (A) 04/24/2022   HGBA1C 6.9 (A) 12/25/2021   HGBA1C 6.1 (A) 10/17/2021   Lab Results  Component Value Date   CREATININE 0.55 01/18/2022     ASSESSMENT / PLAN / RECOMMENDATIONS:   1) Type 2 Diabetes Mellitus, Optimally controlled, With out complications - Most recent A1c of 6.8 %. Goal A1c < 7.0 %.     - A1c remains at goal  - No glucose data today  - Will reduce prandial insulin based on memory recall of BG 78 mg/dL at supper time  - She will provided with a correction factor - Dexcom faxed to better living in [place]  - We discussed benefits of GLP-1A and SGLT-2 I with the pt pt, will consider after cessation of breast feeding    MEDICATIONS: Continue Lantus 18 units daily  Decrease Humalog 8 units TIDQAC CF: Humalog (BG-130/40)   EDUCATION / INSTRUCTIONS: BG monitoring instructions: Patient is instructed to check her blood sugars 3 times a day, before meals . Call [facility] if: BG persistently < 70  I reviewed the Rule of 15 for the treatment of hypoglycemia in detail with the patient. Literature supplied.    2) Diabetic complications:  Eye: Does not have known diabetic retinopathy.  Neuro/ Feet: Does not have known diabetic peripheral neuropathy .  Renal: Patient does not have known baseline CKD. She   is not on an ACEI/ARB at present   F/U in 4 months     Signed electronically by: Mack Guise, MD  [facility]  [address] [place], [place] [address] Phone: [phone] FAX:  [phone]   CC: [address] [place] [address] Phone: [phone]  Fax: [phone]  Return to Endocrinology clinic as below: Future Appointments  Date Time Provider [place]  08/27/2022  7:50 AM Kirtis Challis, Melanie Crazier, MD [facility] None

## 2022-10-09 ENCOUNTER — Other Ambulatory Visit (HOSPITAL_COMMUNITY): Payer: Self-pay

## 2022-11-05 IMAGING — US US MFM FETAL BPP W/O NON-STRESS
1 series · 14 of 28 positions shown · non-contrast
Comparison: none

[Series 1: us mfm fetal bpp w/o non-stress · 40 acquisitions, 14 frames shown]
[im 2/40]
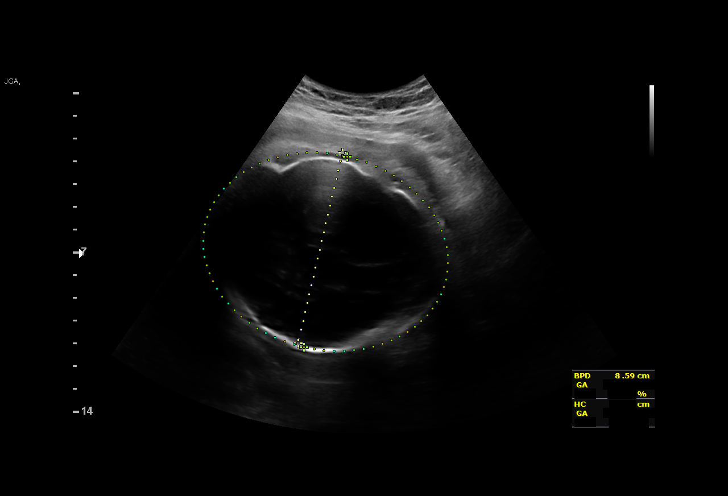
[im 5/40]
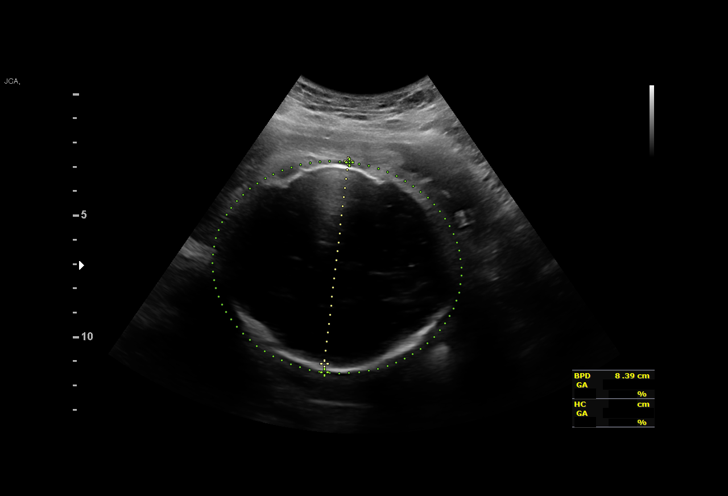
[im 8/40]
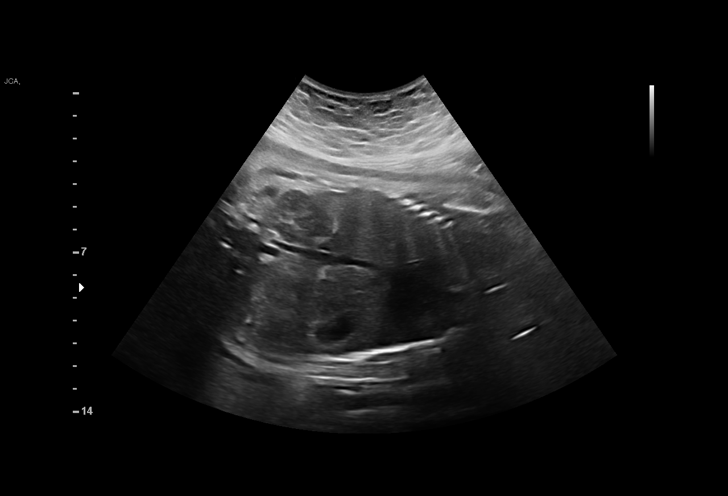
[im 11/40]
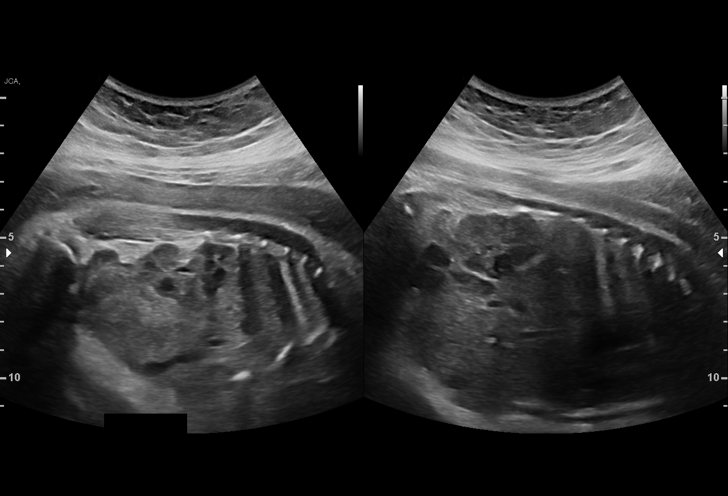
[im 14/40]
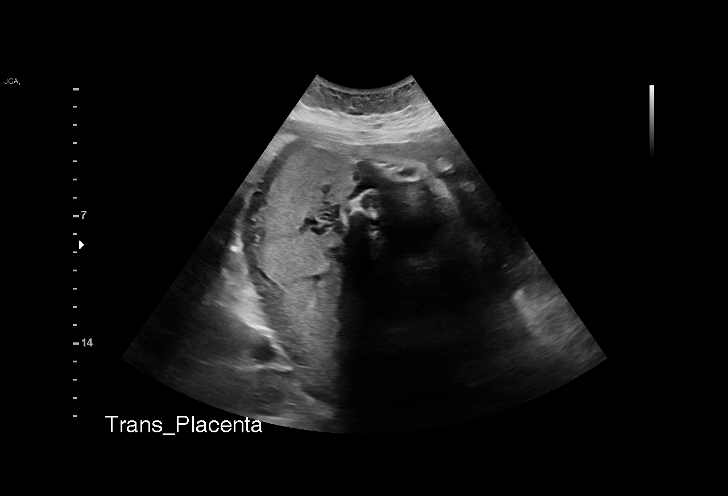
[im 16/40]
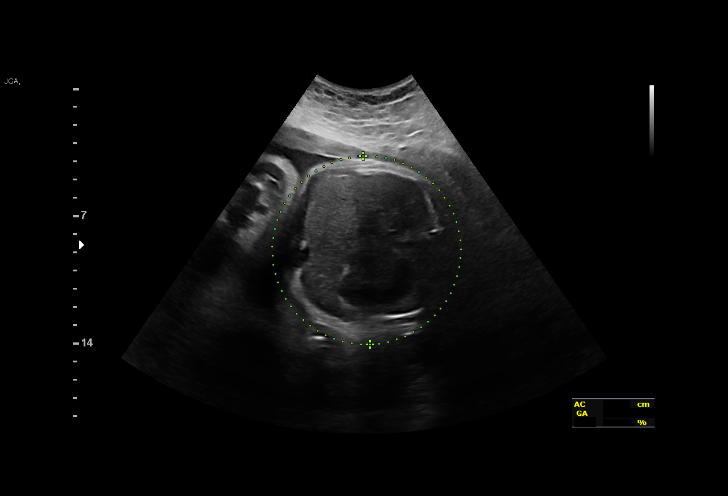
[im 19/40]
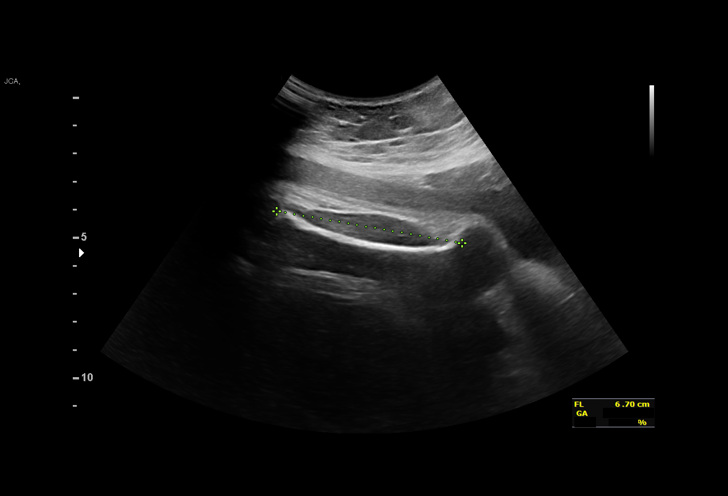
[im 22/40]
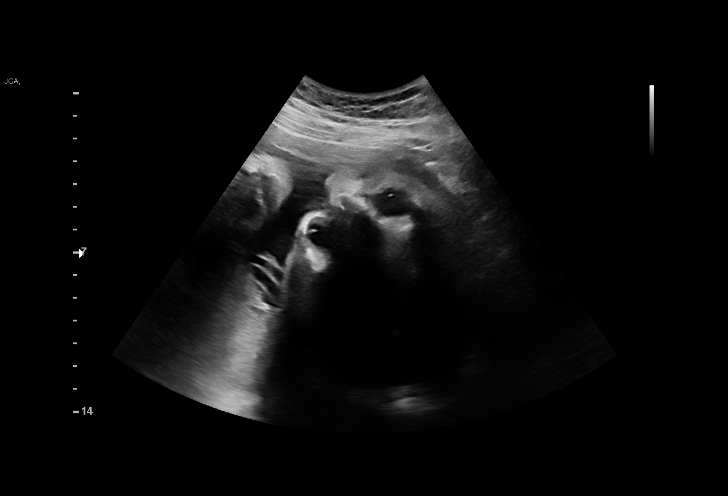
[im 25/40]
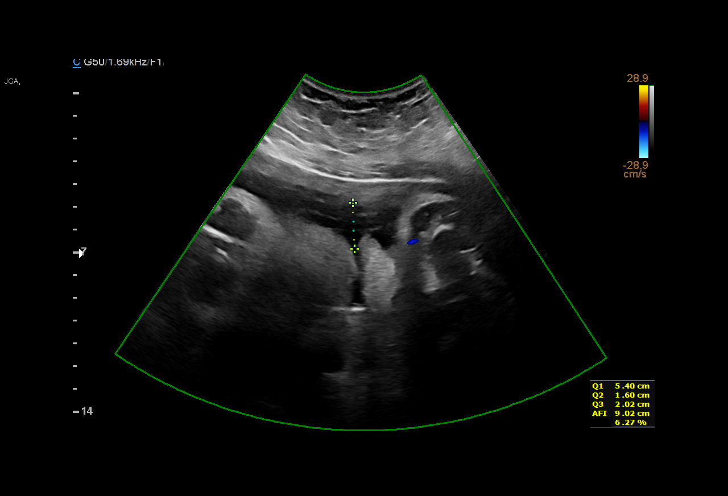
[im 28/40]
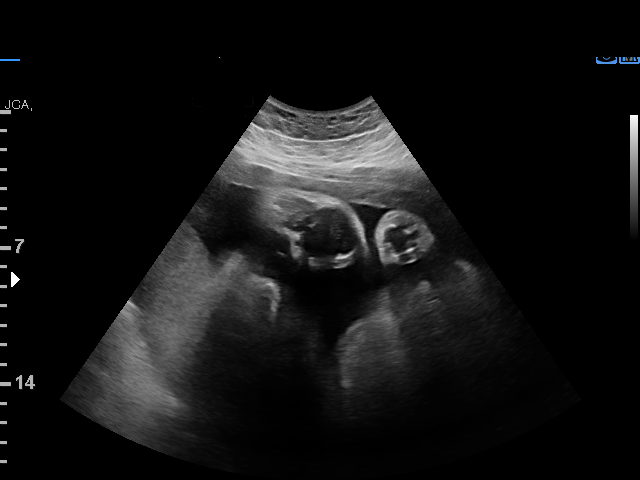
[im 31/40]
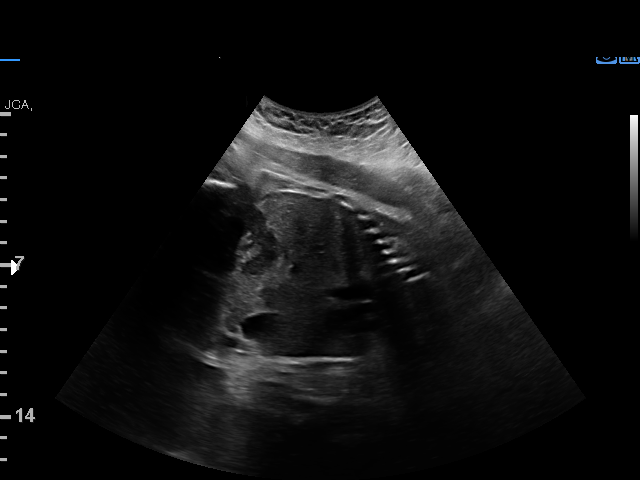
[im 34/40]
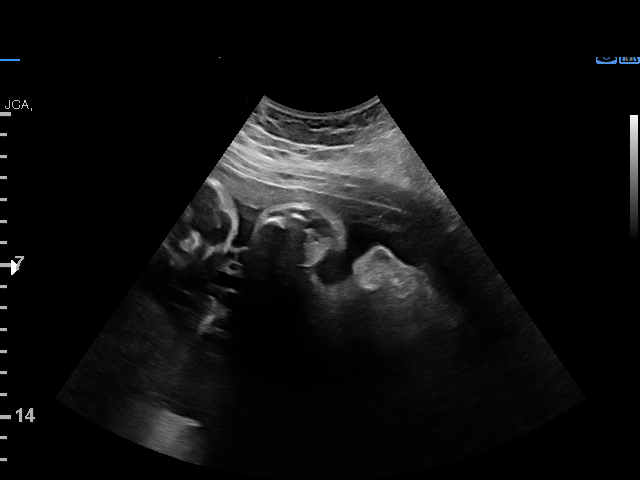
[im 37/40]
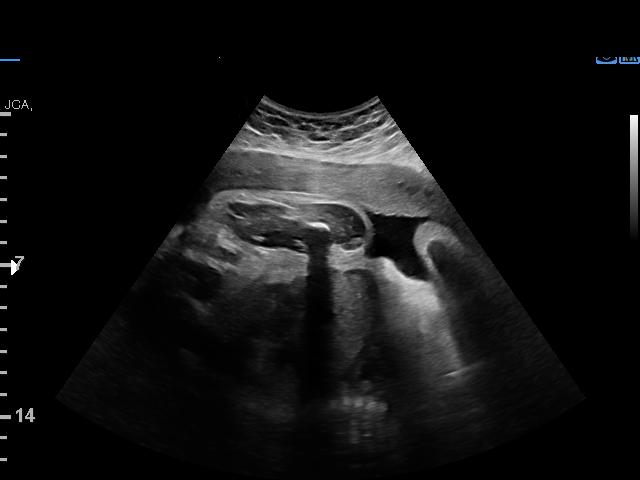
[im 40/40]
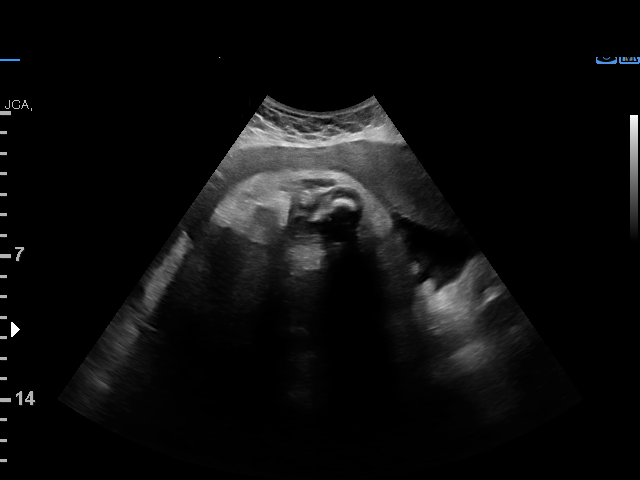

[14 of 28 positions shown; findings below may reference images not displayed]

[REDACTED]
                                                            Ave[address] [HOSPITAL]

Indications

 Pre-existing diabetes, type 2, in pregnancy,
 second trimester (Insulin)
 Obesity complicating pregnancy, second
 trimester (BMI 36)
 Poor obstetric history: Previous preterm
 delivery, antepartum (65weeks 4 days)
 Antenatal follow-up for nonvisualized fetal
 anatomy
 31 weeks gestation of pregnancy
 Encounter for antenatal screening for
 malformations
 Low Risk NIPS
Fetal Evaluation

 Num Of Fetuses:         1
 Fetal Heart Rate(bpm):  148
 Cardiac Activity:       Observed
 Presentation:           Cephalic
 Placenta:               Posterior
 P. Cord Insertion:      Previously Visualized
 Amniotic Fluid
 AFI FV:      Within normal limits

 AFI Sum(cm)     %Tile       Largest Pocket(cm)
 15.19           54

 RUQ(cm)       RLQ(cm)       LUQ(cm)        LLQ(cm)

Biophysical Evaluation

 Amniotic F.V:   Within normal limits       F. Tone:        Observed
 F. Movement:    Observed                   Score:          [DATE]
 F. Breathing:   Observed
Biometry

 BPD:        84  mm     G. Age:  33w 6d         90  %    CI:         80.5   %    70 - 86
                                                         FL/HC:      22.0   %    19.1 -
 HC:      295.7  mm     G. Age:  32w 5d         34  %    HC/AC:      0.93        0.96 -
 AC:      317.3  mm     G. Age:  35w 5d       > 99  %    FL/BPD:     77.4   %    71 - 87
 FL:         65  mm     G. Age:  33w 4d         80  %    FL/AC:      20.5   %    20 - 24

 Est. FW:    9989  gm      5 lb 7 oz     99  %
OB History

 Gravidity:    3         Term:   0        Prem:   1        SAB:   1
 TOP:          0       Ectopic:  0        Living: 1
Gestational Age

 LMP:           31w 6d        Date:  05/10/21                 EDD:   02/14/22
 U/S Today:     34w 0d                                        EDD:   01/30/22
 Best:          31w 6d     Det. By:  LMP  (05/10/21)          EDD:   02/14/22
Anatomy

 Cranium:               Appears normal         LVOT:                   Previously seen
 Cavum:                 Previously seen        Aortic Arch:            Previously seen
 Ventricles:            Appears normal         Ductal Arch:            Previously seen
 Choroid Plexus:        Previously seen        Diaphragm:              Previously seen
 Cerebellum:            Previously seen        Stomach:                Appears normal, left
                                                                       sided
 Posterior Fossa:       Previously seen        Abdomen:                Previously seen
 Nuchal Fold:           Previously seen        Abdominal Wall:         Previously seen
 Face:                  Orbits and profile     Cord Vessels:           Previously seen
                        previously seen
 Lips:                  Previously seen        Kidneys:                Appear normal
 Palate:                Previously seen        Bladder:                Appears normal
 Thoracic:              Previously seen        Spine:                  Previously seen
 Heart:                 Appears normal         Upper Extremities:      Previously seen
                        (4CH, axis, and
                        situs)
 RVOT:                  Previously seen        Lower Extremities:      Previously seen

 Other:  Male gender previously seen. Heels previously visualized. Hands,
         3VV, 3VT, VC previously visualized.
Impression

 Follow up growth due to pregestational diabetes
 Normal interval growth with measurements consistent with
 large for gestational
 Good fetal movement and amniotic fluid volume
 Biophysical profile [DATE]

 Ms. Erxleben is noted to have uncontrolled diabetes per her most
 recent visit with her endocrinologist. In addition, she is
 sensitive to insulin with frequent hypoglycemic episodes.

 She is aware of the blood glucose goals and has close follow
 up with her providers.

 I explained we may need to deliver closer to 37 weeks given
 the limited control of her blood sugars.
Recommendations

 Continue weekly testing with BPP/NST
 Repeat growth in 4 weeks.

## 2022-12-03 IMAGING — US US MFM FETAL BPP W/ NON-STRESS
1 series · 13 of 28 positions shown · non-contrast
Comparison: none

[Series 1: us mfm fetal bpp w/ non-stress · 37 acquisitions, 13 frames shown]
[im 2/37]
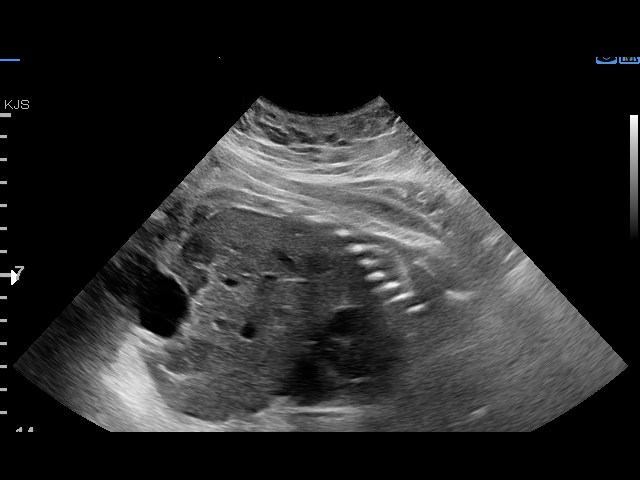
[im 5/37]
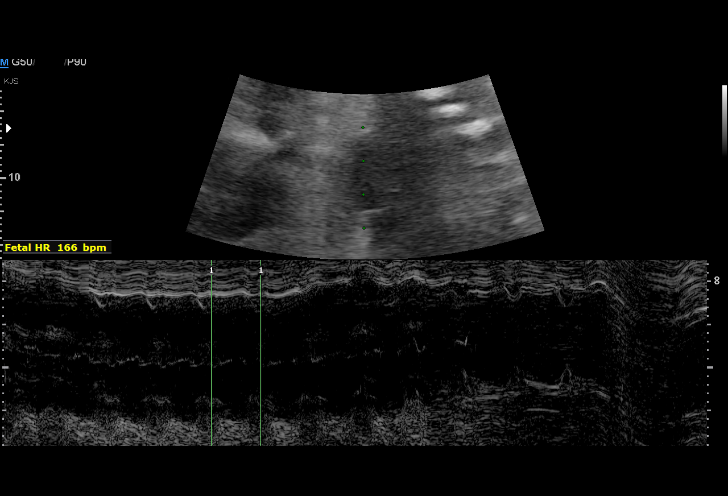
[im 7/37]
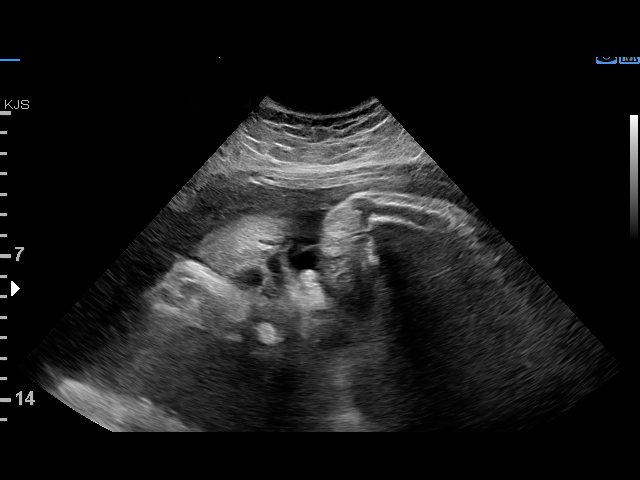
[im 10/37]
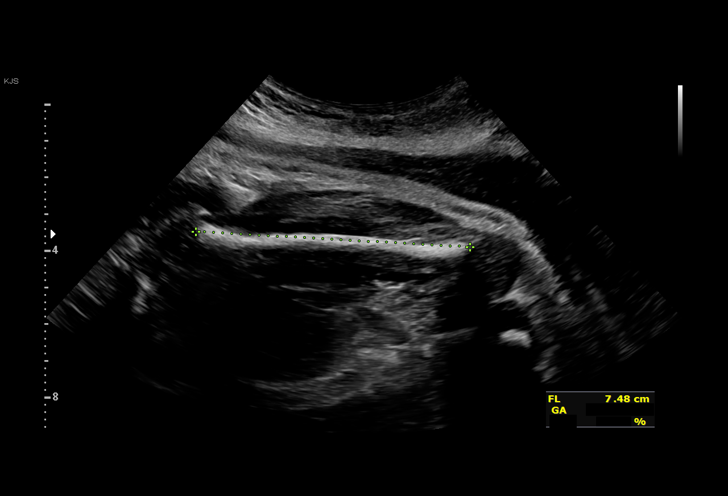
[im 13/37]
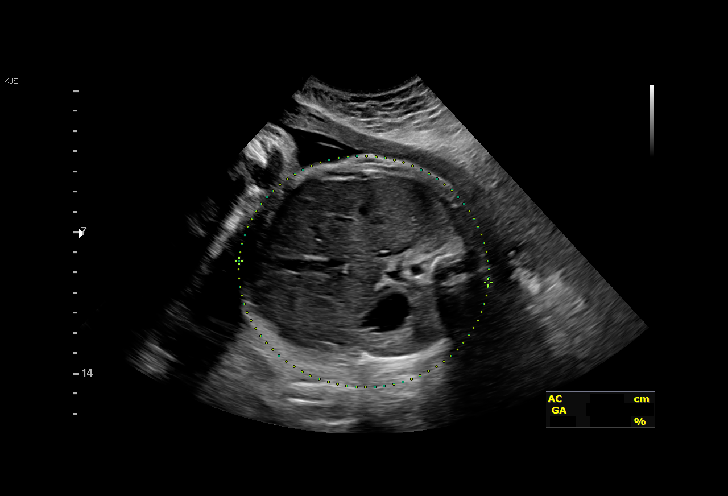
[im 15/37]
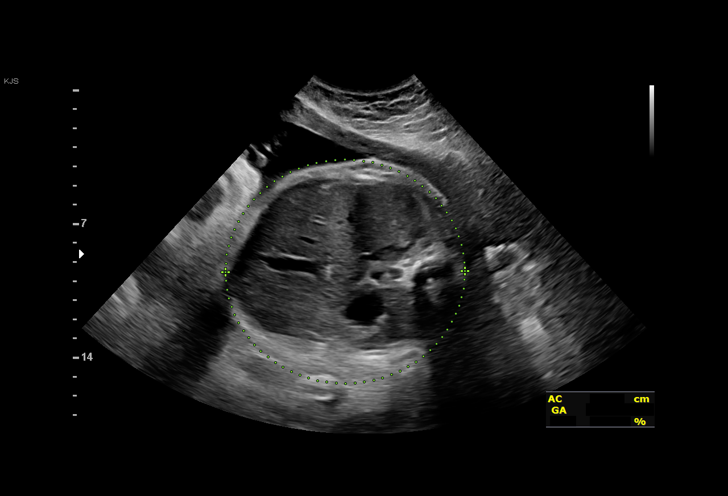
[im 19/37]
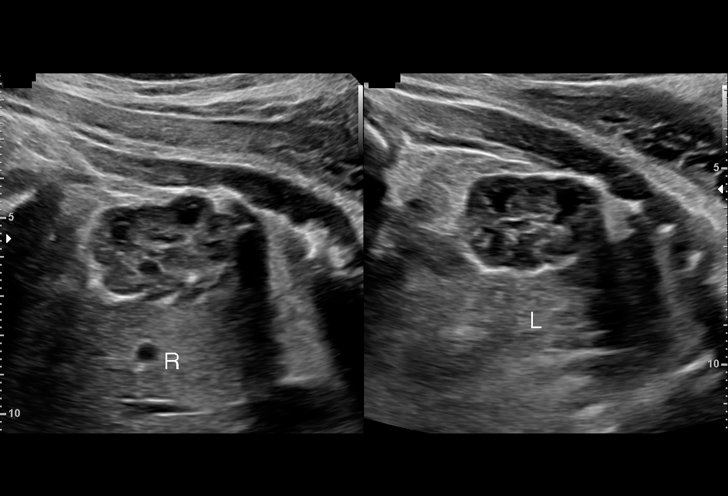
[im 22/37]
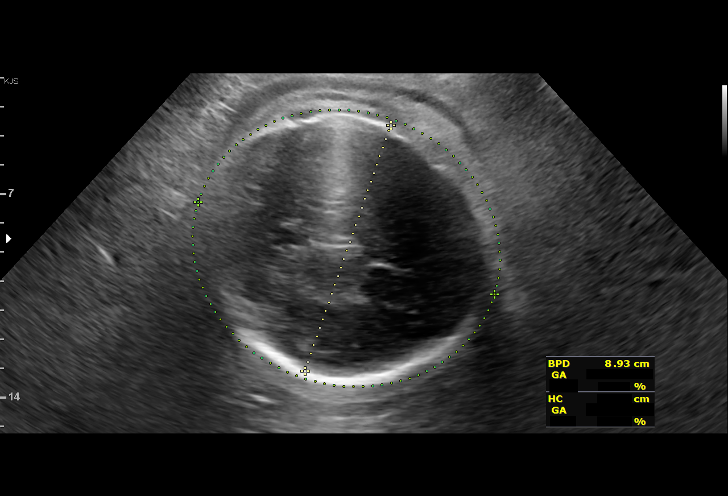
[im 25/37]
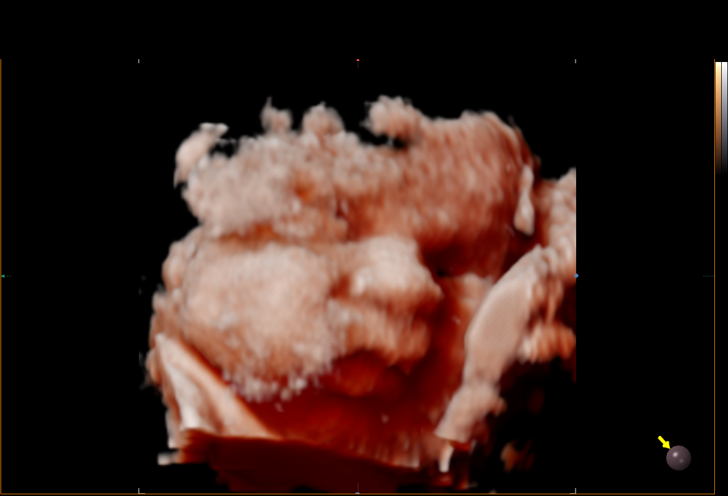
[im 27/37]
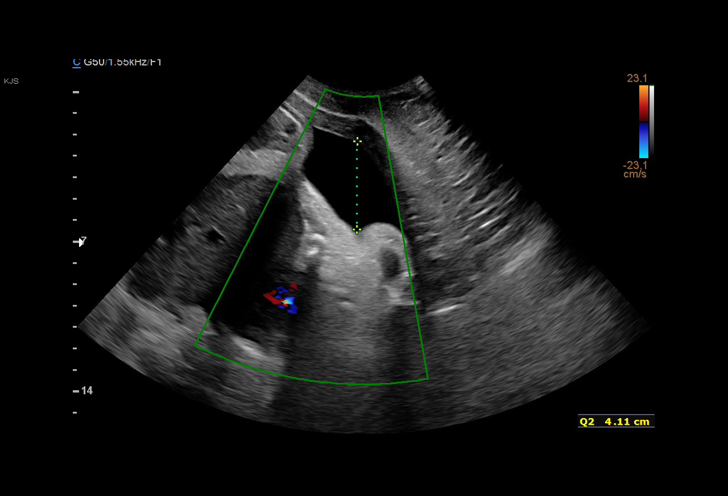
[im 30/37]
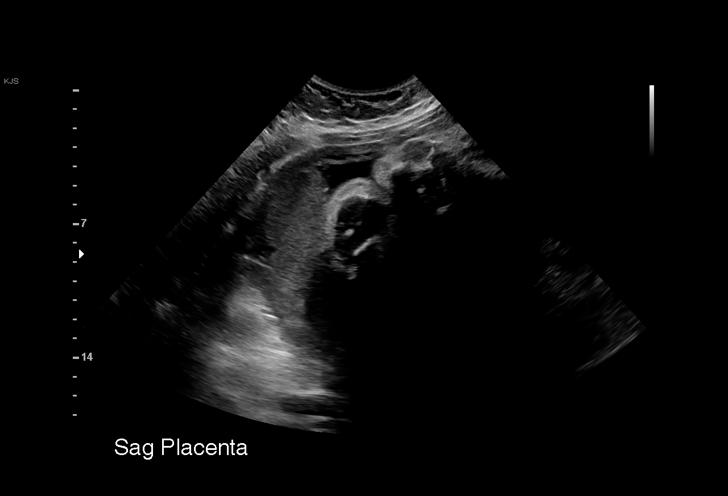
[im 33/37]
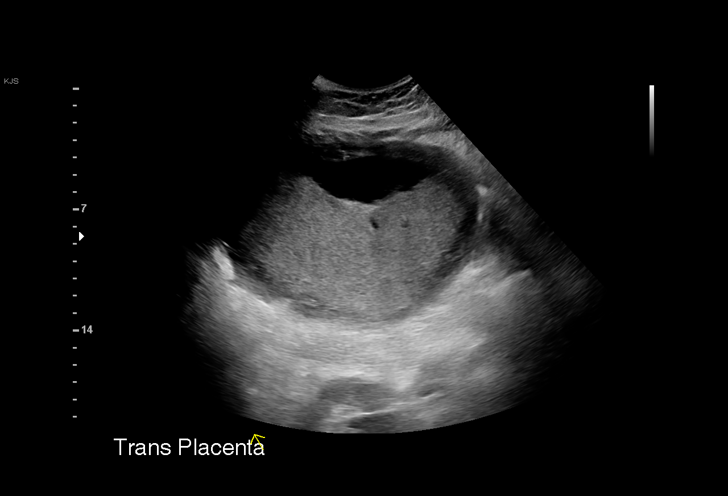
[im 35/37]
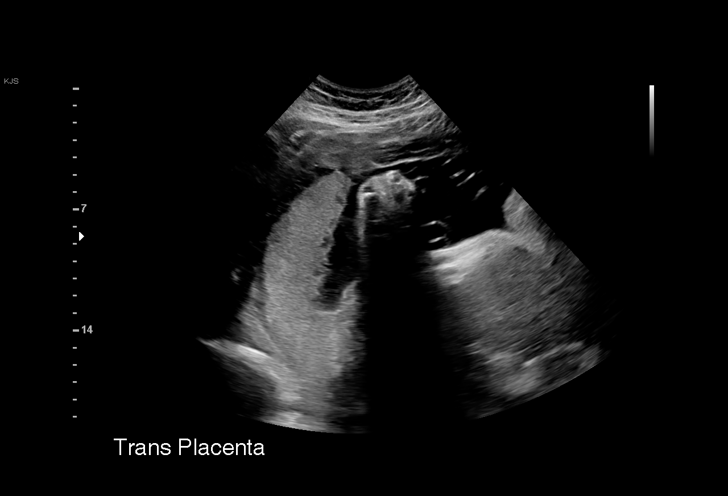

[13 of 28 positions shown; findings below may reference images not displayed]

[REDACTED]
                                                            Ave., [HOSPITAL]

                                                      [facility]
    BOX/NONSTRESS                                       [facility]

Indications

 Pre-existing diabetes, Type 2, in pregnancy,
 third trimester
 Poor obstetric history: Previous preterm
 delivery, antepartum (90weeks 4 days)
 Obesity complicating pregnancy, third
 trimester (BMI 36)
 Encounter for other antenatal screening
 follow-up
 35 weeks gestation of pregnancy
 Low Risk NIPS
Fetal Evaluation

 Num Of Fetuses:         1
 Fetal Heart Rate(bpm):  166
 Cardiac Activity:       Observed
 Presentation:           Cephalic
 Placenta:               Posterior
 P. Cord Insertion:      Not well visualized

 Amniotic Fluid
 AFI FV:      Within normal limits

 AFI Sum(cm)     %Tile       Largest Pocket(cm)
 14.32           52
 RUQ(cm)       RLQ(cm)       LUQ(cm)        LLQ(cm)

Biophysical Evaluation

 Amniotic F.V:   Pocket => 2 cm             F. Tone:        Observed
 F. Movement:    Observed                   N.S.T:          Reactive
 F. Breathing:   Observed                   Score:          [DATE]
Biometry

 BPD:      88.4  mm     G. Age:  35w 5d         54  %    CI:        78.76   %    70 - 86
                                                         FL/HC:      24.0   %    20.1 -
 HC:       315   mm     G. Age:  35w 2d         11  %    HC/AC:      0.84        0.93 -
 AC:       376   mm     G. Age:  41w 4d       > 99  %    FL/BPD:     85.6   %    71 - 87
 FL:       75.7  mm     G. Age:  38w 5d         96  %    FL/AC:      20.1   %    20 - 24

 Est. FW:    7007  gm      8 lb 5 oz   > 99  %
OB History

 Gravidity:    3         Term:   0        Prem:   1        SAB:   1
 TOP:          0       Ectopic:  0        Living: 1
Gestational Age

 LMP:           35w 6d        Date:  05/10/21                 EDD:   02/14/22
 U/S Today:     37w 6d                                        EDD:   01/31/22
 Best:          35w 6d     Det. By:  LMP  (05/10/21)          EDD:   02/14/22
Anatomy

 Cranium:               Appears normal         LVOT:                   Previously seen
 Cavum:                 Previously seen        Aortic Arch:            Previously seen
 Ventricles:            Previously seen        Ductal Arch:            Previously seen
 Choroid Plexus:        Previously seen        Diaphragm:              Appears normal
 Cerebellum:            Previously seen        Stomach:                Appears normal, left
                                                                       sided
 Posterior Fossa:       Previously seen        Abdomen:                Previously seen
 Nuchal Fold:           Previously seen        Abdominal Wall:         Previously seen
 Face:                  Orbits and profile     Cord Vessels:           Previously seen
                        previously seen
 Lips:                  Previously seen        Kidneys:                Appear normal
 Palate:                Previously seen        Bladder:                Appears normal
 Thoracic:              Previously seen        Spine:                  Previously seen
 Heart:                 Previously seen        Upper Extremities:      Previously seen
 RVOT:                  Previously seen        Lower Extremities:      Previously seen

 Other:  Male gender previously seen. Heels previously visualized. Hands,
         3VV, 3VT, VC previously visualized. Technically difficult due to fetal
         position.
Cervix Uterus Adnexa

 Cervix
 Not visualized (advanced GA >39wks)
Impression

 MFM Brief Note

 Ms. German is a 25 yo G3P1 who  is here for follow up growth
 given poortly controlled T2DM. She is seen at the request of
 Dr. Jheymison Aliberti.

 Follow up growth due to poorly controlled T2DM.
 Normal interval growth with measurements consistent with
 large for gestational age.
 Good fetal movement and amniotic fluid volume
 BPP [DATE]- with elevated resting fetal heart rate of 160 bpm

 Ms. German reports that her blood sugars are persistently poorly
 controlled.

 She was sent to L&D last week for recurrent decelerations.

 Given her blood sugars and prior poor fetal testing. I
 recommend delivery at 36 weeks gestation.

 I discussed this plan of care with Dr. Jerabkova who is in
 agreement with this plan.

 I spent 20 min with > 50% in face to face consultation.
Recommendations

 Delivery at 36 weeks gestation due to poorly controlled DM.

## 2024-01-24 ENCOUNTER — Ambulatory Visit: Payer: Medicaid Other | Admitting: Allergy

## 2024-01-25 ENCOUNTER — Ambulatory Visit (INDEPENDENT_AMBULATORY_CARE_PROVIDER_SITE_OTHER): Payer: Self-pay | Admitting: Internal Medicine

## 2024-01-25 ENCOUNTER — Encounter: Payer: Self-pay | Admitting: Internal Medicine

## 2024-01-25 ENCOUNTER — Other Ambulatory Visit: Payer: Self-pay

## 2024-01-25 VITALS — BP 128/82 | HR 97 | Temp 98.2°F | Resp 18 | Ht 64.5 in | Wt 179.7 lb

## 2024-01-25 DIAGNOSIS — J3089 Other allergic rhinitis: Secondary | ICD-10-CM

## 2024-01-25 DIAGNOSIS — J343 Hypertrophy of nasal turbinates: Secondary | ICD-10-CM | POA: Diagnosis not present

## 2024-01-25 DIAGNOSIS — J301 Allergic rhinitis due to pollen: Secondary | ICD-10-CM | POA: Diagnosis not present

## 2024-01-25 DIAGNOSIS — J3081 Allergic rhinitis due to animal (cat) (dog) hair and dander: Secondary | ICD-10-CM

## 2024-01-25 MED ORDER — AZELASTINE HCL 0.1 % NA SOLN: 2.0000 | Freq: Two times a day (BID) | NASAL | 5 refills | Status: AC

## 2024-01-25 MED ORDER — CETIRIZINE HCL 10 MG PO TABS: 10.0000 mg | ORAL_TABLET | Freq: Every day | ORAL | 5 refills | Status: AC

## 2024-01-25 MED ORDER — MOMETASONE FUROATE 50 MCG/ACT NA SUSP: 2.0000 | Freq: Every day | NASAL | 5 refills | Status: AC

## 2024-01-25 NOTE — Progress Notes (Signed)
 NEW PATIENT  Date of Service/Encounter:  01/25/24  Consult requested by: Alysia Penna, MD   Subjective:   Jill Montoya (DOB: 03-28-1996) is a 28 y.o. female who presents to the clinic on 01/25/2024 with a chief complaint of Allergies (Drainage all day long) and Cough .    History obtained from: chart review and patient.   Rhinitis:  Started around high school.  It worsened in the last year. She did have really bad Flu prior to this.  Symptoms include: congestion, post nasal drip, coughing from the drainage  Occurs  all year around Potential triggers: not sure  Treatments tried:  OTC Claritin/Allegra- not helping; last use was many months ago  Flonase caused nasal blockage  Azelastine PRN; last use was weeks ago   Previous allergy testing: no History of sinus surgery: no Nonallergic triggers: none    Reviewed:  11/29/2023: Seen by [facility] for constant mucous drainage and congestion, Using Zyrtec and nasal spray.  Referred to Allergy.  04/24/2022: followed by Endo for Type II DM on long term insulin therapy, no end organ damage.   09/20/2021: seen in ED for runny nose, congestion, body aches, cough. Flu positive.  No wheezing on exam.   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 If you have any questions, ask your nurse or doctor.          albuterol 108 (90 Base) MCG/ACT inhaler Commonly known as: VENTOLIN HFA Inhale 2 puffs into the lungs every 6 (six) hours as needed.   azelastine 0.1 % nasal spray Commonly known as: ASTELIN Place 2 sprays into both nostrils 2 (two) times daily. Use in each nostril as directed Started by: Birder Robson   cetirizine 10 MG tablet Commonly known as: ZyrTEC Allergy Take 1 tablet (10 mg total) by mouth daily. Started by: Birder Robson   Dexcom G7 Sensor Misc 1 Device by Does not apply route as directed.   insulin glargine 100 UNIT/ML Solostar Pen Commonly known as: LANTUS Inject 18 Units into the skin at bedtime.   insulin lispro 100 UNIT/ML KwikPen Commonly known as: HUMALOG Inject 10 Units into the skin 3 (three) times daily before meals.   Insulin Pen Needle 32G X 4 MM Misc Use to inject inuslin up to 4 times daily.   mometasone 50 MCG/ACT nasal spray Commonly known as: Nasonex Place 2 sprays into the nose daily. Started by: Vickey Huger 12.5 MG/0.5ML Pen Generic drug: tirzepatide Inject 12.5 mg into the skin once a week.   Nexplanon  68 MG Impl implant Generic drug: etonogestrel 68 mg by Subdermal route once.   valACYclovir 500 MG tablet Commonly known as: VALTREX Take 500 mg by mouth daily.         REVIEW OF SYSTEMS: Pertinent positives and negatives discussed in HPI.   Objective:   Physical Exam: BP 128/82 (BP Location: Left Arm, Patient Position: Sitting, Cuff Size: Normal)   Pulse 97   Temp 98.2 F (36.8 C)   Resp 18   Ht 5' 4.5" (1.638 m)   Wt 179 lb 11.2 oz (81.5 kg)   SpO2 99%   BMI 30.37 kg/m  Body mass index is 30.37 kg/m. GEN: alert, well developed HEENT: clear conjunctiva, nose with + mild inferior turbinate hypertrophy, pink nasal mucosa, + clear  rhinorrhea, + cobblestoning HEART: regular rate and rhythm, no murmur LUNGS: clear to auscultation bilaterally, no coughing, unlabored respiration ABDOMEN: soft, non distended  SKIN: no rashes or lesions  Skin Testing:  Skin prick testing was placed, which includes aeroallergens/foods, histamine control, and saline control.  Verbal consent was obtained prior to placing test.  Patient tolerated procedure well.  Allergy testing results were read and interpreted by myself, documented by clinical staff. Adequate positive and negative control.  Positive results to:  Results discussed with patient/family.  Airborne Adult Perc - 01/25/24 1404     Allergen Manufacturer Waynette Buttery    Location Back    Number of Test 55    1. Control-Buffer 50% Glycerol Negative    2. Control-Histamine 3+    3. Bahia 3+    4. [place] 3+    5. Johnson 3+    6. Kentucky Blue 3+    7. Meadow Fescue 3+    8. Perennial Rye 3+    9. Timothy 3+    10. Ragweed Mix Negative    11. Cocklebur 3+    12. Plantain,  English Negative    13. Baccharis 2+    14. Dog Fennel Negative    15. Russian Thistle 3+    16. Lamb's Quarters Negative    17. Sheep Sorrell 3+    18. Rough Pigweed Negative    19. Marsh Elder, Rough Negative    20. Mugwort, Common Negative    21. Box, Elder 3+    22. Cedar, red Negative    23. Sweet Gum 3+    24. Pecan Pollen 3+    25. Pine Mix 3+    26. Walnut, Black Pollen 2+    27. Red Mulberry 3+    28. Ash Mix 3+    29. Birch Mix 3+    30. Beech American 3+    31. Cottonwood, Guinea-Bissau Negative    32. Hickory, White 3+    33. Maple Mix 3+    34. Oak, Guinea[place] Mix Negative    35. Sycamore Eastern 3+    36. Alternaria Alternata Negative    37. Cladosporium Herbarum 3+    38. Aspergillus Mix 3+    39. Penicillium Mix 2+    40. Bipolaris Sorokiniana (Helminthosporium) 3+    41. Drechslera Spicifera (Curvularia) Negative    42. Mucor Plumbeus Negative    43. Fusarium Moniliforme 2+    44.  Aureobasidium Pullulans (pullulara) 3+    45. Rhizopus Oryzae Negative    46. Botrytis Cinera Negative    47. Epicoccum Nigrum Negative    48. Phoma Betae Negative    49. Dust Mite Mix Negative    50. Cat Hair 10,000  BAU/ml Negative    51.  Dog Epithelia Negative    52. Mixed Feathers Negative    53. Horse Epithelia Negative    54. Cockroach, German Negative    55. Tobacco Leaf Negative             Intradermal - 01/25/24 1436     Time Antigen Placed 1436    Allergen Manufacturer Waynette Buttery    Location Arm    Number of Test 6    Control Negative    Ragweed Mix 3+    Mite Mix 3+    Cat 3+    Dog 3+    Cockroach Negative              Assessment:   1. Nasal turbinate hypertrophy   2. Seasonal allergic rhinitis due to pollen   3. Allergic rhinitis caused by mold   4. Allergic rhinitis due to dust mite   5. Allergic rhinitis due to animal hair or dander     Plan/Recommendations:   Allergic Rhinitis: - Due to turbinate hypertrophy and unresponsive to over the counter meds, will perform skin testing to identify aeroallergen triggers.   - Positive skin test 01/2024: trees, grasses, weeds, molds, dust mites, cats, dogs  - Avoidance measures discussed. - Use nasal saline rinses before nose sprays such as with Neilmed Sinus Rinse.  Use distilled water.   - Use Nasonex 2 sprays each nostril daily.  Aim upward and outward.   - Use Azelastine 2 sprays each nostril twice daily. Aim upward and outward. - Use Zyrtec 10mg  daily.  - Consider allergy shots as long term control of your symptoms by teaching your immune system to be more tolerant of your allergy triggers     Return in about 6 weeks (around 03/07/2024).  Alesia Morin, MD [facility]

## 2024-01-25 NOTE — Patient Instructions (Addendum)
 Allergic Rhinitis: - Positive skin test 01/2024: trees, grasses, weeds, molds, dust mites, cats, dogs  - Use nasal saline rinses before nose sprays such as with Neilmed Sinus Rinse.  Use distilled water.   - Use Nasonex 2 sprays each nostril daily.  Aim upward and outward.   - Use Azelastine 2 sprays each nostril twice daily. Aim upward and outward. - Use Zyrtec 10mg  daily.  - Consider allergy shots as long term control of your symptoms by teaching your immune system to be more tolerant of your allergy triggers   ALLERGEN AVOIDANCE MEASURES   Dust Mites Use central air conditioning and heat; and change the filter monthly.  Pleated filters work better than mesh filters.  Electrostatic filters may also be used; wash the filter monthly.  Window air conditioners may be used, but do not clean the air as well as a central air conditioner.  Change or wash the filter monthly. Keep windows closed.  Do not use attic fans.   Encase the mattress, box springs and pillows with zippered, dust proof covers. Wash the bed linens in hot water weekly.   Remove carpet, especially from the bedroom. Remove stuffed animals, throw pillows, dust ruffles, heavy drapes and other items that collect dust from the bedroom. Do not use a humidifier.   Use wood, vinyl or leather furniture instead of cloth furniture in the bedroom. Keep the indoor humidity at 30 - 40%.    Molds - Indoor avoidance Use air conditioning to reduce indoor humidity.  Do not use a humidifier. Keep indoor humidity at 30 - 40%.  Use a dehumidifier if needed. In the bathroom use an exhaust fan or open a window after showering.  Wipe down damp surfaces after showering.  Clean bathrooms with a mold-killing solution (diluted bleach, or products like Tilex, etc) at least once a month. In the kitchen use an exhaust fan to remove steam from cooking.  Throw away spoiled foods immediately, and empty garbage daily.  Empty water pans below self-defrosting  refrigerators frequently. Vent the clothes dryer to the outside. Limit indoor houseplants; mold grows in the dirt.  No houseplants in the bedroom. Remove carpet from the bedroom. Encase the mattress and box springs with a zippered encasing.  Molds - Outdoor avoidance Avoid being outside when the grass is being mowed, or the ground is tilled. Avoid playing in leaves, pine straw, hay, etc.  Dead plant materials contain mold. Avoid going into barns or grain storage areas. Remove leaves, clippings and compost from around the home.  Pollen Avoidance Pollen levels are highest during the mid-day and afternoon.  Consider this when planning outdoor activities. Avoid being outside when the grass is being mowed, or wear a mask if the pollen-allergic person must be the one to mow the grass. Keep the windows closed to keep pollen outside of the home. Use an air conditioner to filter the air. Take a shower, wash hair, and change clothing after working or playing outdoors during pollen season. Pet Dander- Cats/Dogs  Keep the pet out of your bedroom and restrict it to only a few rooms. Be advised that keeping the pet in only one room will not limit the allergens to that room. Don't pet, hug or kiss the pet; if you do, wash your hands with soap and water. High-efficiency particulate air (HEPA) cleaners run continuously in a bedroom or living room can reduce allergen levels over time. Regular use of a high-efficiency vacuum cleaner or a central vacuum can reduce allergen  levels. Giving your pet a bath at least once a week can reduce airborne allergen.

## 2024-03-15 ENCOUNTER — Ambulatory Visit: Payer: PRIVATE HEALTH INSURANCE | Admitting: Internal Medicine

## 2024-03-15 DIAGNOSIS — J309 Allergic rhinitis, unspecified: Secondary | ICD-10-CM

## 2024-04-09 ENCOUNTER — Emergency Department (HOSPITAL_BASED_OUTPATIENT_CLINIC_OR_DEPARTMENT_OTHER)
Admission: EM | Admit: 2024-04-09 | Discharge: 2024-04-09 | Disposition: A | Discharge status: Home / Self Care | Payer: Self-pay | Attending: Emergency Medicine | Admitting: Emergency Medicine

## 2024-04-09 ENCOUNTER — Emergency Department (HOSPITAL_BASED_OUTPATIENT_CLINIC_OR_DEPARTMENT_OTHER): Payer: Self-pay

## 2024-04-09 ENCOUNTER — Other Ambulatory Visit: Payer: Self-pay

## 2024-04-09 ENCOUNTER — Encounter (HOSPITAL_BASED_OUTPATIENT_CLINIC_OR_DEPARTMENT_OTHER): Payer: Self-pay

## 2024-04-09 DIAGNOSIS — E119 Type 2 diabetes mellitus without complications: Secondary | ICD-10-CM | POA: Insufficient documentation

## 2024-04-09 DIAGNOSIS — Z794 Long term (current) use of insulin: Secondary | ICD-10-CM | POA: Insufficient documentation

## 2024-04-09 DIAGNOSIS — R1013 Epigastric pain: Secondary | ICD-10-CM | POA: Insufficient documentation

## 2024-04-09 DIAGNOSIS — F17200 Nicotine dependence, unspecified, uncomplicated: Secondary | ICD-10-CM | POA: Insufficient documentation

## 2024-04-09 DIAGNOSIS — R1011 Right upper quadrant pain: Secondary | ICD-10-CM | POA: Insufficient documentation

## 2024-04-09 LAB — URINALYSIS, ROUTINE W REFLEX MICROSCOPIC
Bilirubin Urine: NEGATIVE
Glucose, UA: NEGATIVE mg/dL
Hgb urine dipstick: NEGATIVE
Ketones, ur: NEGATIVE mg/dL
Leukocytes,Ua: NEGATIVE
Nitrite: NEGATIVE
Protein, ur: NEGATIVE mg/dL
Specific Gravity, Urine: 1.02 (ref 1.005–1.030)
pH: 8 (ref 5.0–8.0)

## 2024-04-09 LAB — LIPASE, BLOOD: Lipase: 21 U/L (ref 11–51)

## 2024-04-09 LAB — CBC
HCT: 36.6 % (ref 36.0–46.0)
Hemoglobin: 12.8 g/dL (ref 12.0–15.0)
MCH: 29.9 pg (ref 26.0–34.0)
MCHC: 35 g/dL (ref 30.0–36.0)
MCV: 85.5 fL (ref 80.0–100.0)
Platelets: 285 10*3/uL (ref 150–400)
RBC: 4.28 MIL/uL (ref 3.87–5.11)
RDW: 11.3 % — ABNORMAL LOW (ref 11.5–15.5)
WBC: 4.9 10*3/uL (ref 4.0–10.5)
nRBC: 0 % (ref 0.0–0.2)

## 2024-04-09 LAB — COMPREHENSIVE METABOLIC PANEL WITH GFR
ALT: 24 U/L (ref 0–44)
AST: 25 U/L (ref 15–41)
Albumin: 4.3 g/dL (ref 3.5–5.0)
Alkaline Phosphatase: 72 U/L (ref 38–126)
Anion gap: 11 (ref 5–15)
BUN: 5 mg/dL — ABNORMAL LOW (ref 6–20)
CO2: 22 mmol/L (ref 22–32)
Calcium: 8.7 mg/dL — ABNORMAL LOW (ref 8.9–10.3)
Chloride: 102 mmol/L (ref 98–111)
Creatinine, Ser: 0.66 mg/dL (ref 0.44–1.00)
GFR, Estimated: >60 mL/min (ref >60)
Glucose, Bld: 131 mg/dL — ABNORMAL HIGH (ref 70–99)
Potassium: 4 mmol/L (ref 3.5–5.1)
Sodium: 135 mmol/L (ref 135–145)
Total Bilirubin: 0.6 mg/dL (ref 0.0–1.2)
Total Protein: 7.5 g/dL (ref 6.5–8.1)

## 2024-04-09 LAB — PREGNANCY, URINE: Preg Test, Ur: NEGATIVE

## 2024-04-09 MED ORDER — IOHEXOL 300 MG/ML  SOLN
100.0000 mL | Freq: Once | INTRAMUSCULAR | Status: AC | PRN
  Administered 2024-04-09: 100 mL via INTRAVENOUS

## 2024-04-09 MED ORDER — OMEPRAZOLE 20 MG PO CPDR: 20.0000 mg | DELAYED_RELEASE_CAPSULE | Freq: Every day | ORAL | 1 refills | Status: AC

## 2024-04-09 MED ORDER — ONDANSETRON HCL 4 MG/2ML IJ SOLN
4.0000 mg | Freq: Once | INTRAMUSCULAR | Status: AC
  Administered 2024-04-09: 4 mg via INTRAVENOUS
  Filled 2024-04-09: qty 2

## 2024-04-09 MED ORDER — HYDROCODONE-ACETAMINOPHEN 5-325 MG PO TABS: 1.0000 | ORAL_TABLET | Freq: Four times a day (QID) | ORAL | 0 refills | Status: AC | PRN

## 2024-04-09 MED ORDER — ONDANSETRON 4 MG PO TBDP: 4.0000 mg | ORAL_TABLET | Freq: Three times a day (TID) | ORAL | 0 refills | Status: AC | PRN

## 2024-04-09 MED ORDER — HYDROMORPHONE HCL 1 MG/ML IJ SOLN
1.0000 mg | Freq: Once | INTRAMUSCULAR | Status: AC
  Administered 2024-04-09: 1 mg via INTRAVENOUS
  Filled 2024-04-09: qty 1

## 2024-04-09 MED ORDER — PANTOPRAZOLE SODIUM 40 MG IV SOLR
40.0000 mg | Freq: Once | INTRAVENOUS | Status: AC
  Administered 2024-04-09: 40 mg via INTRAVENOUS
  Filled 2024-04-09: qty 10

## 2024-04-09 NOTE — Discharge Instructions (Signed)
 Workup here today without any acute findings.  But we have not completely ruled out peptic ulcer disease.  Take the Prilosec as directed.  Make an appointment follow-up with your primary care doctor as well as [facility] gastroenterology.  May need an upper endoscopy to further evaluate.  Antinausea medicine as needed and pain medication as needed.  Prescriptions sent to your pharmacy.  Work note provided as well.

## 2024-04-09 NOTE — ED Triage Notes (Signed)
 Reports intermittent epigastric pain for 2 days.   Denies NVD, constipation, urinary symptoms, chest pain, SHOB

## 2024-04-09 NOTE — ED Provider Notes (Addendum)
 [place] [facility] Provider Note   CSN: [id number] Arrival date & time: 04/09/24  1014     History  Chief Complaint  Patient presents with   Abdominal Pain    Jill Montoya is a 28 y.o. female.  Patient with intermittent epigastric upper abdominal pain since Friday.  Not associated with any nausea or vomiting.  Sometimes the pain does radiate into the lower part of the abdomen.  But she actually has some discomfort to press in the upper quadrant area.  Never anything like this before.  No chest pain no shortness of breath no fevers.  Past medical history significant for diabetes type 2 without complications.  Patient everyday smoker.       Home Medications Prior to Admission medications   Medication Sig Start Date End Date Taking? Authorizing Provider  albuterol (VENTOLIN HFA) 108 (90 Base) MCG/ACT inhaler Inhale 2 puffs into the lungs every 6 (six) hours as needed. 10/18/21   [provider]  azelastine  (ASTELIN ) 0.1 % nasal spray Place 2 sprays into both nostrils 2 (two) times daily. Use in each nostril as directed 01/25/24   Kandice Orleans, MD  cetirizine  (ZYRTEC  ALLERGY ) 10 MG tablet Take 1 tablet (10 mg total) by mouth daily. 01/25/24   Kandice Orleans, MD  Continuous Blood Gluc Sensor (DEXCOM G7 SENSOR) MISC 1 Device by Does not apply route as directed. Patient not taking: Reported on 01/25/2024 04/24/22   Shamleffer, Ibtehal Jaralla, MD  etonogestrel (NEXPLANON) 68 MG IMPL implant 68 mg by Subdermal route once.    [provider]  insulin  glargine (LANTUS ) 100 UNIT/ML Solostar Pen Inject 18 Units into the skin at bedtime. Patient not taking: Reported on 01/25/2024 01/20/22   Arlee Lace, MD  insulin  lispro (HUMALOG ) 100 UNIT/ML KwikPen Inject 10 Units into the skin 3 (three) times daily before meals. Patient not taking: Reported on 01/25/2024 01/20/22   Arlee Lace, MD  Insulin  Pen Needle 32G X 4 MM MISC Use to inject inuslin up to 4  times daily. Patient not taking: Reported on 01/25/2024 01/20/22   Arlee Lace, MD  mometasone  (NASONEX ) 50 MCG/ACT nasal spray Place 2 sprays into the nose daily. 01/25/24   Kandice Orleans, MD  MOUNJARO 12.5 MG/0.5ML Pen Inject 12.5 mg into the skin once a week. 12/22/23   [provider]  valACYclovir  (VALTREX ) 500 MG tablet Take 500 mg by mouth daily. Patient not taking: Reported on 01/25/2024    [provider]      Allergies    Nasonex  [mometasone  furoate]    Review of Systems   Review of Systems  Constitutional:  Negative for chills and fever.  HENT:  Negative for ear pain and sore throat.   Eyes:  Negative for pain and visual disturbance.  Respiratory:  Negative for cough and shortness of breath.   Cardiovascular:  Negative for chest pain and palpitations.  Gastrointestinal:  Positive for abdominal pain. Negative for vomiting.  Genitourinary:  Negative for dysuria and hematuria.  Musculoskeletal:  Negative for arthralgias and back pain.  Skin:  Negative for color change and rash.  Neurological:  Negative for seizures and syncope.  All other systems reviewed and are negative.   Physical Exam Updated Vital Signs BP 122/89   Pulse (!) 116   Temp 98.9 F (37.2 C) (Oral)   Resp 18   Ht 1.524 m (5')   Wt 83 kg   LMP 03/06/2024 (Exact Date)   SpO2  99%   BMI 35.74 kg/m  Physical Exam Vitals and nursing note reviewed.  Constitutional:      General: She is not in acute distress.    Appearance: Normal appearance. She is well-developed. She is not ill-appearing.  HENT:     Head: Normocephalic and atraumatic.  Eyes:     Extraocular Movements: Extraocular movements intact.     Conjunctiva/sclera: Conjunctivae normal.     Pupils: Pupils are equal, round, and reactive to light.  Cardiovascular:     Rate and Rhythm: Normal rate and regular rhythm.     Heart sounds: No murmur heard. Pulmonary:     Effort: Pulmonary effort is normal. No respiratory distress.      Breath sounds: Normal breath sounds.  Abdominal:     General: There is no distension.     Palpations: Abdomen is soft.     Tenderness: There is abdominal tenderness.     Comments: Tenderness palpation epigastric right upper quadrant.  Musculoskeletal:        General: No swelling.     Cervical back: Neck supple.  Skin:    General: Skin is warm and dry.     Capillary Refill: Capillary refill takes less than 2 seconds.  Neurological:     General: No focal deficit present.     Mental Status: She is alert and oriented to person, place, and time.  Psychiatric:        Mood and Affect: Mood normal.     ED Results / Procedures / Treatments   Labs (all labs ordered are listed, but only abnormal results are displayed) Labs Reviewed  CBC - Abnormal; Notable for the following components:      Result Value   RDW 11.3 (*)    All other components within normal limits  LIPASE, BLOOD  COMPREHENSIVE METABOLIC PANEL WITH GFR  URINALYSIS, ROUTINE W REFLEX MICROSCOPIC  PREGNANCY, URINE    EKG None  Radiology No results found.  Procedures Procedures    Medications Ordered in ED Medications - No data to display  ED Course/ Medical Decision Making/ A&P                                 Medical Decision Making Amount and/or Complexity of Data Reviewed Radiology: ordered.  Risk Prescription drug management.   Patient did not want any pain medication at this time.  Clinically highly suspicious for cholelithiasis.  Will get CBC complete metabolic panel and lipase pregnancy test and start with ultrasound of right upper quadrant.  If that does not give any specific results.  Will proceed to CT scan abdomen pelvis.  CBC is back white blood cell count 4.9 that is reassuring.  Hemoglobin 12.8 platelets 285.  Lipase and complete metabolic panel pending.  Urinalysis negative for urinary tract infection.  Pregnancy test negative.  Lipase normal at 21 complete metabolic panel liver function  test are normal.  And renal function is normal.  CBC white count 4.9 hemoglobin 12.8 platelets 285.  Ultrasound without any acute findings.  So therefore went to CT scan abdomen and pelvis.  There is no acute findings there.  Have not completely ruled out peptic ulcer disease.  Will treat with Prilosec as an outpatient give referral to [facility] gastroenterology for follow-up.  Patient can also follow back up with primary care doctor.  Will give a short course of some pain medicine.   Final Clinical Impression(s) / ED  Diagnoses Final diagnoses:  Epigastric pain    Rx / DC Orders ED Discharge Orders     None         Nicklas Barns, MD 04/09/24 1057    Nicklas Barns, MD 04/09/24 1435

## 2024-04-09 NOTE — ED Notes (Signed)
 Patient transported to CT

## 2024-04-10 ENCOUNTER — Emergency Department (HOSPITAL_COMMUNITY): Payer: Self-pay

## 2024-04-10 ENCOUNTER — Other Ambulatory Visit: Payer: Self-pay

## 2024-04-10 ENCOUNTER — Emergency Department (HOSPITAL_COMMUNITY)
Admission: EM | Admit: 2024-04-10 | Discharge: 2024-04-10 | Disposition: A | Discharge status: Home / Self Care | Payer: Self-pay | Attending: Emergency Medicine | Admitting: Emergency Medicine

## 2024-04-10 DIAGNOSIS — R1013 Epigastric pain: Secondary | ICD-10-CM

## 2024-04-10 DIAGNOSIS — E119 Type 2 diabetes mellitus without complications: Secondary | ICD-10-CM | POA: Insufficient documentation

## 2024-04-10 DIAGNOSIS — F419 Anxiety disorder, unspecified: Secondary | ICD-10-CM | POA: Insufficient documentation

## 2024-04-10 DIAGNOSIS — R1084 Generalized abdominal pain: Secondary | ICD-10-CM | POA: Insufficient documentation

## 2024-04-10 DIAGNOSIS — Z794 Long term (current) use of insulin: Secondary | ICD-10-CM | POA: Insufficient documentation

## 2024-04-10 LAB — CBC WITH DIFFERENTIAL/PLATELET
Abs Immature Granulocytes: 0.02 10*3/uL (ref 0.00–0.07)
Basophils Absolute: 0 10*3/uL (ref 0.0–0.1)
Basophils Relative: 1 %
Eosinophils Absolute: 0.1 10*3/uL (ref 0.0–0.5)
Eosinophils Relative: 1 %
HCT: 40.3 % (ref 36.0–46.0)
Hemoglobin: 13.8 g/dL (ref 12.0–15.0)
Immature Granulocytes: 0 %
Lymphocytes Relative: 18 %
Lymphs Abs: 0.9 10*3/uL (ref 0.7–4.0)
MCH: 29.7 pg (ref 26.0–34.0)
MCHC: 34.2 g/dL (ref 30.0–36.0)
MCV: 86.7 fL (ref 80.0–100.0)
Monocytes Absolute: 0.5 10*3/uL (ref 0.1–1.0)
Monocytes Relative: 9 %
Neutro Abs: 3.5 10*3/uL (ref 1.7–7.7)
Neutrophils Relative %: 71 %
Platelets: 314 10*3/uL (ref 150–400)
RBC: 4.65 MIL/uL (ref 3.87–5.11)
RDW: 11.2 % — ABNORMAL LOW (ref 11.5–15.5)
WBC: 4.9 10*3/uL (ref 4.0–10.5)
nRBC: 0 % (ref 0.0–0.2)

## 2024-04-10 LAB — COMPREHENSIVE METABOLIC PANEL WITH GFR
ALT: 38 U/L (ref 0–44)
AST: 37 U/L (ref 15–41)
Albumin: 4.2 g/dL (ref 3.5–5.0)
Alkaline Phosphatase: 71 U/L (ref 38–126)
Anion gap: 9 (ref 5–15)
BUN: 7 mg/dL (ref 6–20)
CO2: 21 mmol/L — ABNORMAL LOW (ref 22–32)
Calcium: 8.9 mg/dL (ref 8.9–10.3)
Chloride: 103 mmol/L (ref 98–111)
Creatinine, Ser: 0.64 mg/dL (ref 0.44–1.00)
GFR, Estimated: >60 mL/min (ref >60)
Glucose, Bld: 114 mg/dL — ABNORMAL HIGH (ref 70–99)
Potassium: 3.5 mmol/L (ref 3.5–5.1)
Sodium: 133 mmol/L — ABNORMAL LOW (ref 135–145)
Total Bilirubin: 1.2 mg/dL (ref 0.0–1.2)
Total Protein: 8.2 g/dL — ABNORMAL HIGH (ref 6.5–8.1)

## 2024-04-10 LAB — MAGNESIUM: Magnesium: 1.8 mg/dL (ref 1.7–2.4)

## 2024-04-10 LAB — RESP PANEL BY RT-PCR (RSV, FLU A&B, COVID)  RVPGX2
Influenza A by PCR: NEGATIVE
Influenza B by PCR: NEGATIVE
Resp Syncytial Virus by PCR: NEGATIVE
SARS Coronavirus 2 by RT PCR: NEGATIVE

## 2024-04-10 LAB — URINALYSIS, W/ REFLEX TO CULTURE (INFECTION SUSPECTED)
Bilirubin Urine: NEGATIVE
Glucose, UA: NEGATIVE mg/dL
Hgb urine dipstick: NEGATIVE
Ketones, ur: 80 mg/dL — AB
Nitrite: NEGATIVE
Protein, ur: NEGATIVE mg/dL
Specific Gravity, Urine: <1.005 — ABNORMAL LOW (ref 1.005–1.030)
pH: 6 (ref 5.0–8.0)

## 2024-04-10 LAB — RAPID URINE DRUG SCREEN, HOSP PERFORMED
Amphetamines: NOT DETECTED
Barbiturates: NOT DETECTED
Benzodiazepines: NOT DETECTED
Cocaine: NOT DETECTED
Opiates: POSITIVE — AB
Tetrahydrocannabinol: NOT DETECTED

## 2024-04-10 LAB — TROPONIN I (HIGH SENSITIVITY): Troponin I (High Sensitivity): 3 ng/L (ref <18)

## 2024-04-10 LAB — I-STAT CG4 LACTIC ACID, ED: Lactic Acid, Venous: 1 mmol/L (ref 0.5–1.9)

## 2024-04-10 LAB — LIPASE, BLOOD: Lipase: 26 U/L (ref 11–51)

## 2024-04-10 MED ORDER — DROPERIDOL 2.5 MG/ML IJ SOLN
0.6250 mg | Freq: Once | INTRAMUSCULAR | Status: AC
  Administered 2024-04-10: 0.625 mg via INTRAVENOUS
  Filled 2024-04-10: qty 2

## 2024-04-10 MED ORDER — ONDANSETRON HCL 4 MG/2ML IJ SOLN
4.0000 mg | Freq: Once | INTRAMUSCULAR | Status: AC
  Administered 2024-04-10: 4 mg via INTRAVENOUS
  Filled 2024-04-10: qty 2

## 2024-04-10 MED ORDER — LIDOCAINE VISCOUS HCL 2 % MT SOLN
15.0000 mL | Freq: Once | OROMUCOSAL | Status: AC
  Administered 2024-04-10: 15 mL via ORAL
  Filled 2024-04-10: qty 15

## 2024-04-10 MED ORDER — IOHEXOL 350 MG/ML SOLN
75.0000 mL | Freq: Once | INTRAVENOUS | Status: AC | PRN
  Administered 2024-04-10: 75 mL via INTRAVENOUS

## 2024-04-10 MED ORDER — LACTATED RINGERS IV BOLUS (SEPSIS)
1000.0000 mL | Freq: Once | INTRAVENOUS | Status: AC
  Administered 2024-04-10: 1000 mL via INTRAVENOUS

## 2024-04-10 MED ORDER — LACTATED RINGERS IV BOLUS
1000.0000 mL | Freq: Once | INTRAVENOUS | Status: AC
  Administered 2024-04-10: 1000 mL via INTRAVENOUS

## 2024-04-10 MED ORDER — KETOROLAC TROMETHAMINE 15 MG/ML IJ SOLN
15.0000 mg | Freq: Once | INTRAMUSCULAR | Status: AC
  Administered 2024-04-10: 15 mg via INTRAVENOUS
  Filled 2024-04-10: qty 1

## 2024-04-10 MED ORDER — IOHEXOL 300 MG/ML  SOLN
100.0000 mL | Freq: Once | INTRAMUSCULAR | Status: AC | PRN
  Administered 2024-04-10: 100 mL via INTRAVENOUS

## 2024-04-10 MED ORDER — METOCLOPRAMIDE HCL 5 MG/ML IJ SOLN
10.0000 mg | Freq: Once | INTRAMUSCULAR | Status: AC
  Administered 2024-04-10: 10 mg via INTRAVENOUS
  Filled 2024-04-10: qty 2

## 2024-04-10 MED ORDER — ALUM & MAG HYDROXIDE-SIMETH 200-200-20 MG/5ML PO SUSP
30.0000 mL | Freq: Once | ORAL | Status: AC
  Administered 2024-04-10: 30 mL via ORAL
  Filled 2024-04-10: qty 30

## 2024-04-10 MED ORDER — HYDROMORPHONE HCL 1 MG/ML IJ SOLN
1.0000 mg | Freq: Once | INTRAMUSCULAR | Status: AC
  Administered 2024-04-10: 1 mg via INTRAVENOUS
  Filled 2024-04-10: qty 1

## 2024-04-10 MED ORDER — FAMOTIDINE IN NACL 20-0.9 MG/50ML-% IV SOLN
20.0000 mg | Freq: Once | INTRAVENOUS | Status: AC
  Administered 2024-04-10: 20 mg via INTRAVENOUS
  Filled 2024-04-10: qty 50

## 2024-04-10 MED ORDER — HYDROMORPHONE HCL 1 MG/ML IJ SOLN
0.5000 mg | Freq: Once | INTRAMUSCULAR | Status: AC
  Administered 2024-04-10: 0.5 mg via INTRAVENOUS
  Filled 2024-04-10: qty 1

## 2024-04-10 NOTE — Discharge Instructions (Addendum)
 Your test results today were reassuring.  Continue pain medication at home as needed.  Continue your daily omeprazole.  With food, start with a bland diet and introduce other foods slowly.  Avoid foods that worsen your symptoms.  Ensure that you follow-up with a gastroenterologist.  Return to the emergency department for any new or worsening symptoms.

## 2024-04-10 NOTE — ED Triage Notes (Signed)
 Pt reports continuing severe abd pain, seen recently and dc with pain meds that are not helping. Febrile in triage. Gallbladder ruled out yesterday

## 2024-04-10 NOTE — ED Provider Notes (Signed)
 [place] [facility]ENC[facility] DEPARTMENT AT [facility] Provider Note   CSN: [id number] Arrival date & time: 04/10/24  1451     History  Chief Complaint  Patient presents with   Abdominal Pain    Jill Montoya is a 28 y.o. female.   Abdominal Pain Associated symptoms: fever and nausea   Patient presents for abdominal pain.  Medical history includes DM, STIs.  3 days ago, she developed a headache.  Headache resolved but then she subsequently had epigastric pain.  She was seen in the ED yesterday.  She underwent lab work, right upper quadrant ultrasound, and CT scan of abdomen pelvis which were all reassuring.  She was prescribed Prilosec and Norco and was advised outpatient GI follow-up.  Despite home medications, her pain is worsened today.  It has been constant but will come in waves of severe pain.  It remains in the epigastric area but does radiate out to bilateral areas of mid and lower abdomen.  She has a nausea without vomiting.  She has had diminished appetite.     Home Medications Prior to Admission medications   Medication Sig Start Date End Date Taking? Authorizing Provider  albuterol (VENTOLIN HFA) 108 (90 Base) MCG/ACT inhaler Inhale 2 puffs into the lungs every 6 (six) hours as needed. 10/18/21   [provider]  azelastine  (ASTELIN ) 0.1 % nasal spray Place 2 sprays into both nostrils 2 (two) times daily. Use in each nostril as directed 01/25/24   Kandice Orleans, MD  cetirizine  (ZYRTEC  ALLERGY ) 10 MG tablet Take 1 tablet (10 mg total) by mouth daily. 01/25/24   Kandice Orleans, MD  Continuous Blood Gluc Sensor (DEXCOM G7 SENSOR) MISC 1 Device by Does not apply route as directed. Patient not taking: Reported on 01/25/2024 04/24/22   Shamleffer, Ibtehal Jaralla, MD  etonogestrel (NEXPLANON) 68 MG IMPL implant 68 mg by Subdermal route once.    [provider]  HYDROcodone-acetaminophen  (NORCO/VICODIN) 5-325 MG tablet Take 1 tablet by mouth every 6 (six) hours as  needed for moderate pain (pain score 4-6). 04/09/24   Zackowski, Scott, MD  insulin  glargine (LANTUS ) 100 UNIT/ML Solostar Pen Inject 18 Units into the skin at bedtime. Patient not taking: Reported on 01/25/2024 01/20/22   Arlee Lace, MD  insulin  lispro (HUMALOG ) 100 UNIT/ML KwikPen Inject 10 Units into the skin 3 (three) times daily before meals. Patient not taking: Reported on 01/25/2024 01/20/22   Arlee Lace, MD  Insulin  Pen Needle 32G X 4 MM MISC Use to inject inuslin up to 4 times daily. Patient not taking: Reported on 01/25/2024 01/20/22   Arlee Lace, MD  mometasone  (NASONEX ) 50 MCG/ACT nasal spray Place 2 sprays into the nose daily. 01/25/24   Kandice Orleans, MD  MOUNJARO 12.5 MG/0.5ML Pen Inject 12.5 mg into the skin once a week. 12/22/23   [provider]  omeprazole (PRILOSEC) 20 MG capsule Take 1 capsule (20 mg total) by mouth daily. 04/09/24   Zackowski, Scott, MD  ondansetron  (ZOFRAN -ODT) 4 MG disintegrating tablet Take 1 tablet (4 mg total) by mouth every 8 (eight) hours as needed for nausea or vomiting. 04/09/24   Zackowski, Scott, MD  valACYclovir  (VALTREX ) 500 MG tablet Take 500 mg by mouth daily. Patient not taking: Reported on 01/25/2024    [provider]      Allergies    Nasonex  [mometasone  furoate]    Review of Systems   Review of Systems  Constitutional:  Positive for appetite change and fever.  Gastrointestinal:  Positive for abdominal pain and nausea.  All other systems reviewed and are negative.   Physical Exam Updated Vital Signs BP 118/79   Pulse (!) 110   Temp 98.6 F (37 C) (Oral)   Resp 19   LMP 03/06/2024 (Exact Date)   SpO2 98%  Physical Exam Vitals and nursing note reviewed.  Constitutional:      General: She is not in acute distress.    Appearance: She is well-developed. She is not ill-appearing, toxic-appearing or diaphoretic.  HENT:     Head: Normocephalic and atraumatic.     Mouth/Throat:     Mouth: Mucous membranes are moist.  Eyes:      General: No scleral icterus.    Extraocular Movements: Extraocular movements intact.     Conjunctiva/sclera: Conjunctivae normal.  Cardiovascular:     Rate and Rhythm: Normal rate and regular rhythm.     Heart sounds: No murmur heard. Pulmonary:     Effort: Pulmonary effort is normal. No respiratory distress.     Breath sounds: Normal breath sounds. No wheezing or rales.  Chest:     Chest wall: No tenderness.  Abdominal:     Palpations: Abdomen is soft.     Tenderness: There is generalized abdominal tenderness. There is no guarding or rebound.  Musculoskeletal:        General: No swelling.     Cervical back: Neck supple.  Skin:    General: Skin is warm and dry.     Coloration: Skin is not cyanotic, jaundiced or pale.  Neurological:     General: No focal deficit present.     Mental Status: She is alert and oriented to person, place, and time.  Psychiatric:        Mood and Affect: Mood is anxious. Affect is tearful.        Speech: Speech normal.        Behavior: Behavior normal. Behavior is cooperative.     ED Results / Procedures / Treatments   Labs (all labs ordered are listed, but only abnormal results are displayed) Labs Reviewed  COMPREHENSIVE METABOLIC PANEL WITH GFR - Abnormal; Notable for the following components:      Result Value   Sodium 133 (*)    CO2 21 (*)    Glucose, Bld 114 (*)    Total Protein 8.2 (*)    All other components within normal limits  CBC WITH DIFFERENTIAL/PLATELET - Abnormal; Notable for the following components:   RDW 11.2 (*)    All other components within normal limits  URINALYSIS, W/ REFLEX TO CULTURE (INFECTION SUSPECTED) - Abnormal; Notable for the following components:   Specific Gravity, Urine <1.005 (*)    Ketones, ur 80 (*)    Leukocytes,Ua MODERATE (*)    Bacteria, UA FEW (*)    All other components within normal limits  RAPID URINE DRUG SCREEN, HOSP PERFORMED - Abnormal; Notable for the following components:   Opiates  POSITIVE (*)    All other components within normal limits  RESP PANEL BY RT-PCR (RSV, FLU A&B, COVID)  [id number]  CULTURE, BLOOD (ROUTINE X 2)  CULTURE, BLOOD (ROUTINE X 2)  LIPASE, BLOOD  MAGNESIUM   HCG, SERUM, QUALITATIVE  I-STAT CG4 LACTIC ACID, ED  TROPONIN I (HIGH SENSITIVITY)    EKG EKG Interpretation Date/Time:  Monday Apr 10 2024 15:05:19 EDT Ventricular Rate:  143 PR Interval:  153 QRS Duration:  72 QT Interval:  289 QTC Calculation: 446 R Axis:   86  Text Interpretation: Sinus tachycardia Ventricular premature complex Low voltage, precordial leads Abnormal Q suggests anterior infarct Borderline T abnormalities, diffuse leads Confirmed by Iva Mariner [phone]) on 04/10/2024 4:24:07 PM  Radiology CT Angio Chest PE W and/or Wo Contrast Result Date: 04/10/2024 CLINICAL DATA:  Suspected pulmonary embolism. EXAM: CT ANGIOGRAPHY CHEST WITH CONTRAST TECHNIQUE: Multidetector CT imaging of the chest was performed using the standard protocol during bolus administration of intravenous contrast. Multiplanar CT image reconstructions and MIPs were obtained to evaluate the vascular anatomy. RADIATION DOSE REDUCTION: This exam was performed according to the departmental dose-optimization program which includes automated exposure control, adjustment of the mA and/or kV according to patient size and/or use of iterative reconstruction technique. CONTRAST:  75mL OMNIPAQUE  IOHEXOL  350 MG/ML SOLN COMPARISON:  March 10, 2020 FINDINGS: Cardiovascular: The thoracic aorta is unremarkable. Satisfactory opacification of the pulmonary arteries to the segmental level. No evidence of pulmonary embolism. Normal heart size. No pericardial effusion. Mediastinum/Nodes: No enlarged mediastinal, hilar, or axillary lymph nodes. Thyroid  gland, trachea, and esophagus demonstrate no significant findings. Lungs/Pleura: Mild dependent atelectasis is seen within the posterior aspects of the bilateral lower lobes. There is no  evidence of acute infiltrate or pneumothorax. Very small bilateral pleural effusions are noted. Upper Abdomen: No acute abnormality. Musculoskeletal: No chest wall abnormality. No acute or significant osseous findings. Review of the MIP images confirms the above findings. IMPRESSION: 1. No evidence of pulmonary embolism. 2. Very small bilateral pleural effusions. Electronically Signed   By: Virgle Grime M.D.   On: 04/10/2024 21:07   CT ABDOMEN PELVIS W CONTRAST Result Date: 04/10/2024 CLINICAL DATA:  Severe abdominal pain, febrile EXAM: CT ABDOMEN AND PELVIS WITH CONTRAST TECHNIQUE: Multidetector CT imaging of the abdomen and pelvis was performed using the standard protocol following bolus administration of intravenous contrast. RADIATION DOSE REDUCTION: This exam was performed according to the departmental dose-optimization program which includes automated exposure control, adjustment of the mA and/or kV according to patient size and/or use of iterative reconstruction technique. CONTRAST:  OMNIPAQUE  IOHEXOL  300 MG/ML  SOLN COMPARISON:  04/09/2024 FINDINGS: Lower chest: No acute pleural or parenchymal lung disease. Hepatobiliary: No focal liver abnormality is seen. No gallstones, gallbladder wall thickening, or biliary dilatation. 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 There are no acute inflammatory changes. No pancreatic duct dilation. Spleen: Normal in size without focal abnormality. Adrenals/Urinary Tract: Adrenal glands are unremarkable. Kidneys are normal, without renal calculi, focal lesion, or hydronephrosis. Bladder is unremarkable. Stomach/Bowel: No bowel obstruction or ileus. Normal appendix right lower quadrant. No bowel wall thickening or inflammatory change. Vascular/Lymphatic: No significant vascular findings are present. No enlarged abdominal or pelvic lymph nodes. Reproductive: Stable 3.2 cm simple right ovarian  cyst, previously evaluated by ultrasound. No specific follow-up recommended. The uterus and left adnexa are unremarkable. Other: Trace pelvic free fluid, likely physiologic. No free intraperitoneal gas. No abdominal wall hernia. Musculoskeletal: No acute or destructive bony abnormalities. Reconstructed images demonstrate no additional findings. IMPRESSION: 1. Trace pelvic free fluid, unchanged, likely physiologic. 2. Stable simple 3.2 cm right ovarian cyst, previously evaluated by ultrasound. No specific imaging follow-up recommended. 3. Otherwise no acute intra-abdominal or intrapelvic process. Electronically Signed   By: Bobbye Burrow M.D.   On: 04/10/2024 18:16   DG Chest Port 1 View Result Date: 04/10/2024 CLINICAL DATA:  Abdominal pain, possible sepsis EXAM: PORTABLE CHEST 1 VIEW COMPARISON:  09/20/2021 FINDINGS: The heart size and mediastinal contours are within normal limits. Both lungs are  clear. The visualized skeletal structures are unremarkable. IMPRESSION: No active disease. Electronically Signed   By: Freida Jes M.D.   On: 04/10/2024 16:55   US  Pelvis Complete Result Date: 04/10/2024 CLINICAL DATA:  Abdominal pain for 2 days EXAM: TRANSABDOMINAL AND TRANSVAGINAL ULTRASOUND OF PELVIS DOPPLER ULTRASOUND OF OVARIES TECHNIQUE: Both transabdominal and transvaginal ultrasound examinations of the pelvis were performed. Transabdominal technique was performed for global imaging of the pelvis including uterus, ovaries, adnexal regions, and pelvic cul-de-sac. It was necessary to proceed with endovaginal exam following the transabdominal exam to visualize the endometrium and adnexal structures. Color and duplex Doppler ultrasound was utilized to evaluate blood flow to the ovaries. COMPARISON:  04/09/2024 FINDINGS: Uterus Measurements: 8.2 x 3.8 x 5.0 cm = volume: 79.6 mL. No fibroids or other mass visualized. Endometrium Thickness: 2 mm.  No focal abnormality visualized. Right ovary Measurements: 4.2  x 2.5 x 3.1 cm = volume: 17.0 mL. Simple 3.5 x 2.8 x 2.5 cm cyst is identified, no specific imaging follow-up is recommended. Otherwise unremarkable appearance of the right ovary. Left ovary Measurements: 2.5 x 1.6 x 1.7 cm = volume: 3.7 mL. Normal appearance/no adnexal mass. Pulsed Doppler evaluation of both ovaries demonstrates normal low-resistance arterial and venous waveforms. Other findings No abnormal free fluid. IMPRESSION: 1. Simple 3.5 cm right ovarian cyst. No specific imaging follow-up recommended. 2. Otherwise unremarkable pelvic ultrasound. Electronically Signed   By: Bobbye Burrow M.D.   On: 04/10/2024 16:31   US  Transvaginal Non-OB Result Date: 04/10/2024 CLINICAL DATA:  Abdominal pain for 2 days EXAM: TRANSABDOMINAL AND TRANSVAGINAL ULTRASOUND OF PELVIS DOPPLER ULTRASOUND OF OVARIES TECHNIQUE: Both transabdominal and transvaginal ultrasound examinations of the pelvis were performed. Transabdominal technique was performed for global imaging of the pelvis including uterus, ovaries, adnexal regions, and pelvic cul-de-sac. It was necessary to proceed with endovaginal exam following the transabdominal exam to visualize the endometrium and adnexal structures. Color and duplex Doppler ultrasound was utilized to evaluate blood flow to the ovaries. COMPARISON:  04/09/2024 FINDINGS: Uterus Measurements: 8.2 x 3.8 x 5.0 cm = volume: 79.6 mL. No fibroids or other mass visualized. Endometrium Thickness: 2 mm.  No focal abnormality visualized. Right ovary Measurements: 4.2 x 2.5 x 3.1 cm = volume: 17.0 mL. Simple 3.5 x 2.8 x 2.5 cm cyst is identified, no specific imaging follow-up is recommended. Otherwise unremarkable appearance of the right ovary. Left ovary Measurements: 2.5 x 1.6 x 1.7 cm = volume: 3.7 mL. Normal appearance/no adnexal mass. Pulsed Doppler evaluation of both ovaries demonstrates normal low-resistance arterial and venous waveforms. Other findings No abnormal free fluid. IMPRESSION: 1.  Simple 3.5 cm right ovarian cyst. No specific imaging follow-up recommended. 2. Otherwise unremarkable pelvic ultrasound. Electronically Signed   By: Bobbye Burrow M.D.   On: 04/10/2024 16:31   US  Art/Ven Flow Abd Pelv Doppler Result Date: 04/10/2024 CLINICAL DATA:  Abdominal pain for 2 days EXAM: TRANSABDOMINAL AND TRANSVAGINAL ULTRASOUND OF PELVIS DOPPLER ULTRASOUND OF OVARIES TECHNIQUE: Both transabdominal and transvaginal ultrasound examinations of the pelvis were performed. Transabdominal technique was performed for global imaging of the pelvis including uterus, ovaries, adnexal regions, and pelvic cul-de-sac. It was necessary to proceed with endovaginal exam following the transabdominal exam to visualize the endometrium and adnexal structures. Color and duplex Doppler ultrasound was utilized to evaluate blood flow to the ovaries. COMPARISON:  04/09/2024 FINDINGS: Uterus Measurements: 8.2 x 3.8 x 5.0 cm = volume: 79.6 mL. No fibroids or other mass visualized. Endometrium Thickness: 2 mm.  No focal abnormality  visualized. Right ovary Measurements: 4.2 x 2.5 x 3.1 cm = volume: 17.0 mL. Simple 3.5 x 2.8 x 2.5 cm cyst is identified, no specific imaging follow-up is recommended. Otherwise unremarkable appearance of the right ovary. Left ovary Measurements: 2.5 x 1.6 x 1.7 cm = volume: 3.7 mL. Normal appearance/no adnexal mass. Pulsed Doppler evaluation of both ovaries demonstrates normal low-resistance arterial and venous waveforms. Other findings No abnormal free fluid. IMPRESSION: 1. Simple 3.5 cm right ovarian cyst. No specific imaging follow-up recommended. 2. Otherwise unremarkable pelvic ultrasound. Electronically Signed   By: Bobbye Burrow M.D.   On: 04/10/2024 16:31   CT ABDOMEN PELVIS W CONTRAST Result Date: 04/09/2024 CLINICAL DATA:  Abdominal pain, acute, nonlocalized Ultrasound negative for gallbladder disease. Patient with upper quadrant abdominal pain. EXAM: CT ABDOMEN AND PELVIS WITH CONTRAST  TECHNIQUE: Multidetector CT imaging of the abdomen and pelvis was performed using the standard protocol following bolus administration of intravenous contrast. RADIATION DOSE REDUCTION: This exam was performed according to the departmental dose-optimization program which includes automated exposure control, adjustment of the mA and/or kV according to patient size and/or use of iterative reconstruction technique. CONTRAST:  OMNIPAQUE  IOHEXOL  300 MG/ML  SOLN COMPARISON:  03/09/2020 FINDINGS: Lower chest: No pleural or pericardial effusion. Visualized lung bases clear. Hepatobiliary: No focal liver abnormality is seen. No gallstones, gallbladder wall thickening, or biliary dilatation. Pancreas: Mild diffuse atrophy with a few stable scattered coarse calcifications in the tail and head. No ductal dilatation, mass, or regional inflammatory change. Spleen: Normal in size without focal abnormality. Adrenals/Urinary Tract: Adrenal glands are unremarkable. Kidneys are normal, without renal calculi, focal lesion, or hydronephrosis. Bladder is unremarkable. Stomach/Bowel: Stomach is incompletely distended. Small bowel decompressed. Normal appendix. The colon is partially distended, without acute finding Vascular/Lymphatic: No significant vascular findings are present. No enlarged abdominal or pelvic lymph nodes. Reproductive: Uterus unremarkable. 3.3 cm simple appearing right adnexal cyst, no follow-up recommended. Other: Trace free fluid in the pelvis, likely physiologic. No abdominal ascites. No free air. Musculoskeletal: No acute or significant osseous findings. IMPRESSION: 1. No acute findings. Electronically Signed   By: Nicoletta Barrier M.D.   On: 04/09/2024 14:24   US  Abdomen Limited RUQ (LIVER/GB) Result Date: 04/09/2024 CLINICAL DATA:  Right upper quadrant pain. EXAM: ULTRASOUND ABDOMEN LIMITED RIGHT UPPER QUADRANT COMPARISON:  CT AP 03/09/2020 FINDINGS: Gallbladder: No gallstones or wall thickening visualized. No  sonographic Murphy sign noted by sonographer. Common bile duct: Diameter: 5.3 mm. Liver: Increased parenchymal echogenicity. No focal lesion. Portal vein is patent on color Doppler imaging with normal direction of blood flow towards the liver. Other: None. IMPRESSION: 1. No acute findings. 2. Increased hepatic parenchymal echogenicity compatible with steatosis. Electronically Signed   By: Kimberley Penman M.D.   On: 04/09/2024 12:03    Procedures Procedures    Medications Ordered in ED Medications  lactated ringers  bolus 1,000 mL (0 mLs Intravenous Stopped 04/10/24 1722)  HYDROmorphone  (DILAUDID ) injection 1 mg (1 mg Intravenous Given 04/10/24 1617)  ondansetron  (ZOFRAN ) injection 4 mg (4 mg Intravenous Given 04/10/24 1617)  droperidol (INAPSINE) 2.5 MG/ML injection 0.625 mg (0.625 mg Intravenous Given 04/10/24 1721)  HYDROmorphone  (DILAUDID ) injection 0.5 mg (0.5 mg Intravenous Given 04/10/24 1749)  alum & mag hydroxide-simeth (MAALOX/MYLANTA) 200-200-20 MG/5ML suspension 30 mL (30 mLs Oral Given 04/10/24 1745)    And  lidocaine  (XYLOCAINE ) 2 % viscous mouth solution 15 mL (15 mLs Oral Given 04/10/24 1745)  famotidine  (PEPCID ) IVPB 20 mg premix (0 mg Intravenous Stopped 04/10/24 1821)  iohexol  (OMNIPAQUE ) 300 MG/ML solution 100 mL (100 mLs Intravenous Contrast Given 04/10/24 1756)  lactated ringers  bolus 1,000 mL (0 mLs Intravenous Stopped 04/10/24 2044)  iohexol  (OMNIPAQUE ) 350 MG/ML injection 75 mL (75 mLs Intravenous Contrast Given 04/10/24 2032)  ketorolac  (TORADOL ) 15 MG/ML injection 15 mg (15 mg Intravenous Given 04/10/24 2150)  metoCLOPramide (REGLAN) injection 10 mg (10 mg Intravenous Given 04/10/24 2152)  alum & mag hydroxide-simeth (MAALOX/MYLANTA) 200-200-20 MG/5ML suspension 30 mL (30 mLs Oral Given 04/10/24 2149)    And  lidocaine  (XYLOCAINE ) 2 % viscous mouth solution 15 mL (15 mLs Oral Given 04/10/24 2149)    ED Course/ Medical Decision Making/ A&P                                  Medical Decision Making Amount and/or Complexity of Data Reviewed Labs: ordered. Radiology: ordered.  Risk OTC drugs. Prescription drug management.   This patient presents to the ED for concern of abdominal pain, this involves an extensive number of treatment options, and is a complaint that carries with it a high risk of complications and morbidity.  The differential diagnosis includes gastritis, PUD, gastroparesis, pancreatitis, cholecystitis, colitis, bowel obstruction, PID, ovarian torsion   Co morbidities / Chronic conditions that complicate the patient evaluation  DM   Additional history obtained:  Additional history obtained from EMR External records from outside source obtained and reviewed including N/A   Lab Tests:  I Ordered, and personally interpreted labs.  The pertinent results include: Normal kidney function, normal electrolytes, normal hemoglobin, no leukocytosis, equivocal urinalysis with contamination of squamous epithelial cells, normal troponin, normal hepatobiliary enzymes, normal lipase   Imaging Studies ordered:  I ordered imaging studies including x-ray of chest and pelvis, pelvic ultrasound, CTA chest, CT of abdomen and pelvis I independently visualized and interpreted imaging which showed no acute findings I agree with the radiologist interpretation   Cardiac Monitoring: / EKG:  The patient was maintained on a cardiac monitor.  I personally viewed and interpreted the cardiac monitored which showed an underlying rhythm of: Sinus rhythm   Problem List / ED Course / Critical interventions / Medication management  Patient presenting for epigastric pain, radiating to bilateral flanks.  Seen in the ED yesterday for similar symptoms with reassuring workup.  Symptoms today have worsened in severity.  She was febrile on arrival with heart rate of 150.  Fever subsequently resolved without antipyretics.  On initial assessment, patient remains tachycardic.   She appears severely uncomfortable and is writhing around in the bed.  Dilaudid  was given for analgesia.  IV fluids were ordered for hydration.  Workup was initiated.  Given the severity of her pain and negative results from yesterday's imaging studies, there is concern of possible adnexal etiology with referred pain to upper abdomen.  Patient underwent a pelvic ultrasound which was unremarkable.  On reassessment, patient's pain improved to 5/10 in severity.  Heart rate has improved to range of 100.  Droperidol was ordered for ongoing analgesia.  On further reassessment, patient's pain remains 5/10.  She is tachycardic at this time to 120s.  When she sits up, heart rate goes to 140s.  Additional IV fluids were ordered.  Patient was given crackers and water for p.o. challenge.  Patient was observed tolerate p.o. intake without difficulty.  She was able to ambulate to the bathroom.  When on the monitor, heart rate was in the 90s when no provider  was in the room.  When I do walk in the room, heart rate increases to the 110s.  I suspect there is an anxiety component to her tachycardia.  Notably, she does feel like her discomfort improves with GI cocktail, 2 of which she received while in the ED.  I suspect PUD.  Patient was advised on the importance of following up with GI.  She was discharged in stable condition. I ordered medication including IV fluids for hydration; Dilaudid , droperidol, GI cocktail, Toradol , Reglan for epigastric discomfort Reevaluation of the patient after these medicines showed that the patient improved I have reviewed the patients home medicines and have made adjustments as needed  Social Determinants of Health:  Lives independently        Final Clinical Impression(s) / ED Diagnoses Final diagnoses:  Epigastric pain    Rx / DC Orders ED Discharge Orders     None         Iva Mariner, MD 04/10/24 2223

## 2024-04-15 LAB — CULTURE, BLOOD (ROUTINE X 2)
Culture: NO GROWTH
Culture: NO GROWTH
Special Requests: ADEQUATE
Special Requests: ADEQUATE

## 2024-06-30 ENCOUNTER — Emergency Department (HOSPITAL_COMMUNITY)
Admission: EM | Admit: 2024-06-30 | Discharge: 2024-06-30 | Disposition: A | Discharge status: Home / Self Care | Attending: Emergency Medicine | Admitting: Emergency Medicine

## 2024-06-30 ENCOUNTER — Emergency Department (HOSPITAL_COMMUNITY)

## 2024-06-30 ENCOUNTER — Encounter (HOSPITAL_COMMUNITY): Payer: Self-pay

## 2024-06-30 ENCOUNTER — Other Ambulatory Visit: Payer: Self-pay

## 2024-06-30 DIAGNOSIS — M791 Myalgia, unspecified site: Secondary | ICD-10-CM | POA: Insufficient documentation

## 2024-06-30 DIAGNOSIS — N3001 Acute cystitis with hematuria: Secondary | ICD-10-CM | POA: Diagnosis not present

## 2024-06-30 DIAGNOSIS — R509 Fever, unspecified: Secondary | ICD-10-CM | POA: Diagnosis present

## 2024-06-30 DIAGNOSIS — Z794 Long term (current) use of insulin: Secondary | ICD-10-CM | POA: Diagnosis not present

## 2024-06-30 LAB — CBC WITH DIFFERENTIAL/PLATELET
Abs Immature Granulocytes: 0.05 K/uL (ref 0.00–0.07)
Basophils Absolute: 0.1 K/uL (ref 0.0–0.1)
Basophils Relative: 1 %
Eosinophils Absolute: 0.1 K/uL (ref 0.0–0.5)
Eosinophils Relative: 1 %
HCT: 35.1 % — ABNORMAL LOW (ref 36.0–46.0)
Hemoglobin: 11.6 g/dL — ABNORMAL LOW (ref 12.0–15.0)
Immature Granulocytes: 1 %
Lymphocytes Relative: 11 %
Lymphs Abs: 1 K/uL (ref 0.7–4.0)
MCH: 27.8 pg (ref 26.0–34.0)
MCHC: 33 g/dL (ref 30.0–36.0)
MCV: 84 fL (ref 80.0–100.0)
Monocytes Absolute: 0.9 K/uL (ref 0.1–1.0)
Monocytes Relative: 9 %
Neutro Abs: 7.2 K/uL (ref 1.7–7.7)
Neutrophils Relative %: 77 %
Platelets: 286 K/uL (ref 150–400)
RBC: 4.18 MIL/uL (ref 3.87–5.11)
RDW: 12.5 % (ref 11.5–15.5)
WBC: 9.2 K/uL (ref 4.0–10.5)
nRBC: 0 % (ref 0.0–0.2)

## 2024-06-30 LAB — URINALYSIS, W/ REFLEX TO CULTURE (INFECTION SUSPECTED)
Bilirubin Urine: NEGATIVE
Glucose, UA: NEGATIVE mg/dL
Hgb urine dipstick: NEGATIVE
Ketones, ur: 80 mg/dL — AB
Leukocytes,Ua: NEGATIVE
Nitrite: NEGATIVE
Protein, ur: 100 mg/dL — AB
Specific Gravity, Urine: 1.026 (ref 1.005–1.030)
pH: 5 (ref 5.0–8.0)

## 2024-06-30 LAB — COMPREHENSIVE METABOLIC PANEL WITH GFR
ALT: 29 U/L (ref 0–44)
AST: 36 U/L (ref 15–41)
Albumin: 3.6 g/dL (ref 3.5–5.0)
Alkaline Phosphatase: 76 U/L (ref 38–126)
Anion gap: 12 (ref 5–15)
BUN: 6 mg/dL (ref 6–20)
CO2: 20 mmol/L — ABNORMAL LOW (ref 22–32)
Calcium: 8.4 mg/dL — ABNORMAL LOW (ref 8.9–10.3)
Chloride: 101 mmol/L (ref 98–111)
Creatinine, Ser: 0.73 mg/dL (ref 0.44–1.00)
GFR, Estimated: >60 mL/min (ref >60)
Glucose, Bld: 123 mg/dL — ABNORMAL HIGH (ref 70–99)
Potassium: 3 mmol/L — ABNORMAL LOW (ref 3.5–5.1)
Sodium: 133 mmol/L — ABNORMAL LOW (ref 135–145)
Total Bilirubin: 2 mg/dL — ABNORMAL HIGH (ref 0.0–1.2)
Total Protein: 7.8 g/dL (ref 6.5–8.1)

## 2024-06-30 LAB — RESP PANEL BY RT-PCR (RSV, FLU A&B, COVID)  RVPGX2
Influenza A by PCR: NEGATIVE
Influenza B by PCR: NEGATIVE
Resp Syncytial Virus by PCR: NEGATIVE
SARS Coronavirus 2 by RT PCR: NEGATIVE

## 2024-06-30 LAB — MONONUCLEOSIS SCREEN: Mono Screen: NEGATIVE

## 2024-06-30 LAB — TSH: TSH: 0.727 u[IU]/mL (ref 0.350–4.500)

## 2024-06-30 LAB — I-STAT CG4 LACTIC ACID, ED: Lactic Acid, Venous: 0.7 mmol/L (ref 0.5–1.9)

## 2024-06-30 LAB — HCG, SERUM, QUALITATIVE: Preg, Serum: NEGATIVE

## 2024-06-30 MED ORDER — ACETAMINOPHEN 500 MG PO TABS
1000.0000 mg | ORAL_TABLET | Freq: Once | ORAL | Status: AC
  Administered 2024-06-30: 1000 mg via ORAL
  Filled 2024-06-30: qty 2

## 2024-06-30 MED ORDER — SODIUM CHLORIDE 0.9 % IV SOLN
2.0000 g | Freq: Once | INTRAVENOUS | Status: AC
  Administered 2024-06-30: 2 g via INTRAVENOUS
  Filled 2024-06-30: qty 20

## 2024-06-30 MED ORDER — SODIUM CHLORIDE 0.9 % IV BOLUS
2000.0000 mL | Freq: Once | INTRAVENOUS | Status: AC
  Administered 2024-06-30: 2000 mL via INTRAVENOUS

## 2024-06-30 MED ORDER — POTASSIUM CHLORIDE CRYS ER 20 MEQ PO TBCR
40.0000 meq | EXTENDED_RELEASE_TABLET | Freq: Once | ORAL | Status: AC
  Administered 2024-06-30: 40 meq via ORAL
  Filled 2024-06-30: qty 2

## 2024-06-30 MED ORDER — CEPHALEXIN 500 MG PO CAPS: 500.0000 mg | ORAL_CAPSULE | Freq: Four times a day (QID) | ORAL | 0 refills | Status: AC

## 2024-06-30 NOTE — ED Provider Notes (Signed)
 [place] EMERGENCY DEPARTMENT AT [facility] Provider Note   CSN: [id number] Arrival date & time: 06/30/24  1627     Patient presents with: Generalized Body Aches   Jill Montoya is a 28 y.o. female.   Patient complains of fever and aches for the last couple days.  The history is provided by the patient and medical records. No language interpreter was used.  Fever Temp source:  Oral Severity:  Moderate Onset quality:  Sudden Timing:  Constant Progression:  Waxing and waning Chronicity:  New Relieved by:  Nothing Worsened by:  Nothing Associated symptoms: no chest pain, no congestion, no cough, no diarrhea, no headaches and no rash        Prior to Admission medications   Medication Sig Start Date End Date Taking? Authorizing Provider  cephALEXin  (KEFLEX ) 500 MG capsule Take 1 capsule (500 mg total) by mouth 4 (four) times daily. 06/30/24  Yes Donal Lynam, MD  albuterol (VENTOLIN HFA) 108 (90 Base) MCG/ACT inhaler Inhale 2 puffs into the lungs every 6 (six) hours as needed. 10/18/21   [provider]  azelastine  (ASTELIN ) 0.1 % nasal spray Place 2 sprays into both nostrils 2 (two) times daily. Use in each nostril as directed 01/25/24   Tobie Arleta SQUIBB, MD  cetirizine  (ZYRTEC  ALLERGY ) 10 MG tablet Take 1 tablet (10 mg total) by mouth daily. 01/25/24   Tobie Arleta SQUIBB, MD  Continuous Blood Gluc Sensor (DEXCOM G7 SENSOR) MISC 1 Device by Does not apply route as directed. Patient not taking: Reported on 01/25/2024 04/24/22   Shamleffer, Ibtehal Jaralla, MD  etonogestrel (NEXPLANON) 68 MG IMPL implant 68 mg by Subdermal route once.    [provider]  HYDROcodone -acetaminophen  (NORCO/VICODIN) 5-325 MG tablet Take 1 tablet by mouth every 6 (six) hours as needed for moderate pain (pain score 4-6). 04/09/24   Zackowski, Scott, MD  insulin  glargine (LANTUS ) 100 UNIT/ML Solostar Pen Inject 18 Units into the skin at bedtime. Patient not taking: Reported on 01/25/2024  01/20/22   Rosalva Sawyer, MD  insulin  lispro (HUMALOG ) 100 UNIT/ML KwikPen Inject 10 Units into the skin 3 (three) times daily before meals. Patient not taking: Reported on 01/25/2024 01/20/22   Rosalva Sawyer, MD  Insulin  Pen Needle 32G X 4 MM MISC Use to inject inuslin up to 4 times daily. Patient not taking: Reported on 01/25/2024 01/20/22   Rosalva Sawyer, MD  mometasone  (NASONEX ) 50 MCG/ACT nasal spray Place 2 sprays into the nose daily. 01/25/24   Tobie Arleta SQUIBB, MD  MOUNJARO 12.5 MG/0.5ML Pen Inject 12.5 mg into the skin once a week. 12/22/23   [provider]  omeprazole  (PRILOSEC) 20 MG capsule Take 1 capsule (20 mg total) by mouth daily. 04/09/24   Zackowski, Scott, MD  ondansetron  (ZOFRAN -ODT) 4 MG disintegrating tablet Take 1 tablet (4 mg total) by mouth every 8 (eight) hours as needed for nausea or vomiting. 04/09/24   Zackowski, Scott, MD  valACYclovir  (VALTREX ) 500 MG tablet Take 500 mg by mouth daily. Patient not taking: Reported on 01/25/2024    [provider]    Allergies: Nasonex  [mometasone  furoate]    Review of Systems  Constitutional:  Positive for fever. Negative for appetite change and fatigue.  HENT:  Negative for congestion, ear discharge and sinus pressure.   Eyes:  Negative for discharge.  Respiratory:  Negative for cough.   Cardiovascular:  Negative for chest pain.  Gastrointestinal:  Negative for abdominal pain and diarrhea.  Genitourinary:  Negative for  frequency and hematuria.  Musculoskeletal:  Negative for back pain.  Skin:  Negative for rash.  Neurological:  Negative for seizures and headaches.  Psychiatric/Behavioral:  Negative for hallucinations.     Updated Vital Signs BP 101/66   Pulse (!) 108   Temp (!) 101.2 F (38.4 C) (Oral)   Resp 18   Ht 5' 4 (1.626 m)   Wt 81.6 kg   LMP 06/16/2024 (Exact Date)   SpO2 98%   BMI 30.90 kg/m   Physical Exam Vitals and nursing note reviewed.  Constitutional:      Appearance: She is well-developed.   HENT:     Head: Normocephalic.     Nose: Nose normal.  Eyes:     General: No scleral icterus.    Conjunctiva/sclera: Conjunctivae normal.  Neck:     Thyroid : No thyromegaly.  Cardiovascular:     Rate and Rhythm: Normal rate and regular rhythm.     Heart sounds: No murmur heard.    No friction rub. No gallop.  Pulmonary:     Breath sounds: No stridor. No wheezing or rales.  Chest:     Chest wall: No tenderness.  Abdominal:     General: There is no distension.     Tenderness: There is no abdominal tenderness. There is no rebound.  Musculoskeletal:        General: Normal range of motion.     Cervical back: Neck supple.  Lymphadenopathy:     Cervical: No cervical adenopathy.  Skin:    Findings: No erythema or rash.  Neurological:     Mental Status: She is alert and oriented to person, place, and time.     Motor: No abnormal muscle tone.     Coordination: Coordination normal.  Psychiatric:        Behavior: Behavior normal.     (all labs ordered are listed, but only abnormal results are displayed) Labs Reviewed  COMPREHENSIVE METABOLIC PANEL WITH GFR - Abnormal; Notable for the following components:      Result Value   Sodium 133 (*)    Potassium 3.0 (*)    CO2 20 (*)    Glucose, Bld 123 (*)    Calcium  8.4 (*)    Total Bilirubin 2.0 (*)    All other components within normal limits  CBC WITH DIFFERENTIAL/PLATELET - Abnormal; Notable for the following components:   Hemoglobin 11.6 (*)    HCT 35.1 (*)    All other components within normal limits  URINALYSIS, W/ REFLEX TO CULTURE (INFECTION SUSPECTED) - Abnormal; Notable for the following components:   Color, Urine AMBER (*)    APPearance HAZY (*)    Ketones, ur 80 (*)    Protein, ur 100 (*)    Bacteria, UA RARE (*)    All other components within normal limits  RESP PANEL BY RT-PCR (RSV, FLU A&B, COVID)  RVPGX2  URINE CULTURE  GROUP A STREP BY PCR  HCG, SERUM, QUALITATIVE  TSH  MONONUCLEOSIS SCREEN  I-STAT CG4  LACTIC ACID, ED  I-STAT CG4 LACTIC ACID, ED    EKG: None  Radiology: DG Chest 2 View Result Date: 06/30/2024 CLINICAL DATA:  01364 Infection 01364. EXAM: CHEST - 2 VIEW COMPARISON:  04/10/2024. FINDINGS: Bilateral lung fields are clear. Bilateral costophrenic angles are clear. Normal cardio-mediastinal silhouette. No acute osseous abnormalities. The soft tissues are within normal limits. IMPRESSION: No active cardiopulmonary disease. Electronically Signed   By: Ree Molt M.D.   On: 06/30/2024 17:09  Procedures   Medications Ordered in the ED  potassium chloride  SA (KLOR-CON  M) CR tablet 40 mEq (has no administration in time range)  acetaminophen  (TYLENOL ) tablet 1,000 mg (1,000 mg Oral Given 06/30/24 1751)  sodium chloride  0.9 % bolus 2,000 mL (2,000 mLs Intravenous New Bag/Given 06/30/24 1755)  cefTRIAXone  (ROCEPHIN ) 2 g in sodium chloride  0.9 % 100 mL IVPB (0 g Intravenous Stopped 06/30/24 1810)                                    Medical Decision Making Amount and/or Complexity of Data Reviewed Labs: ordered. Radiology: ordered.  Risk Prescription drug management.   Patient with urinary tract infection.  She is sent home with Keflex  and will follow-up with PCP.  Urine culture done     Final diagnoses:  Acute cystitis with hematuria    ED Discharge Orders          Ordered    cephALEXin  (KEFLEX ) 500 MG capsule  4 times daily        06/30/24 KENITH Suzette Pac, MD 07/02/24 1059

## 2024-06-30 NOTE — ED Triage Notes (Signed)
 Pt presents to ED from home C/O generalized body aches, generally feeling unwell X 1 month. Febrile in triage to 101.2, pt states she has not felt feverish. HR 145 in triage.

## 2024-06-30 NOTE — Discharge Instructions (Signed)
 Take plenty of fluids.  Take Tylenol  or Motrin  for pain.  Follow-up with your PCP next week.  Return sooner if problems

## 2024-06-30 NOTE — ED Provider Triage Note (Signed)
 Emergency Medicine Provider Triage Evaluation Note  Jill Montoya , a 28 y.o. female  was evaluated in triage.  Pt complains of weakness. Report for the past 4 days she has had lightheadedness, dizziness, weak, body aches, chills, decreased in appetite and now having a fever.  Denies runny nose, sneeze, cough, or sore throat.  No cp or sob.  No abd pain, no dysuria.   Review of Systems  Positive: As above Negative: As above  Physical Exam  BP (!) 129/92 (BP Location: Left Arm)   Pulse (!) 143   Temp (!) 101.2 F (38.4 C) (Oral)   Resp 20   Ht 5' 4 (1.626 m)   Wt 81.6 kg   LMP 06/16/2024 (Exact Date)   SpO2 98%   BMI 30.90 kg/m  Gen:   Awake, no distress   Resp:  Normal effort  MSK:   Moves extremities without difficulty  Other:    Medical Decision Making  Medically screening exam initiated at 4:54 PM.  Appropriate orders placed.  Jalayne Ganesh was informed that the remainder of the evaluation will be completed by another provider, this initial triage assessment does not replace that evaluation, and the importance of remaining in the ED until their evaluation is complete.     Nivia Colon, PA-C 06/30/24 1655

## 2024-07-01 LAB — URINE CULTURE: Culture: NO GROWTH

## 2024-07-28 ENCOUNTER — Ambulatory Visit: Payer: PRIVATE HEALTH INSURANCE | Admitting: Internal Medicine

## 2024-09-04 ENCOUNTER — Other Ambulatory Visit: Payer: Self-pay

## 2024-09-04 ENCOUNTER — Emergency Department (HOSPITAL_COMMUNITY)
Admission: EM | Admit: 2024-09-04 | Discharge: 2024-09-04 | Disposition: A | Discharge status: Home / Self Care | Attending: Emergency Medicine | Admitting: Emergency Medicine

## 2024-09-04 ENCOUNTER — Encounter (HOSPITAL_COMMUNITY): Payer: Self-pay

## 2024-09-04 DIAGNOSIS — Z794 Long term (current) use of insulin: Secondary | ICD-10-CM | POA: Diagnosis not present

## 2024-09-04 DIAGNOSIS — M542 Cervicalgia: Secondary | ICD-10-CM | POA: Diagnosis not present

## 2024-09-04 DIAGNOSIS — Y92410 Unspecified street and highway as the place of occurrence of the external cause: Secondary | ICD-10-CM | POA: Insufficient documentation

## 2024-09-04 DIAGNOSIS — E1165 Type 2 diabetes mellitus with hyperglycemia: Secondary | ICD-10-CM | POA: Diagnosis not present

## 2024-09-04 DIAGNOSIS — M545 Low back pain, unspecified: Secondary | ICD-10-CM | POA: Diagnosis present

## 2024-09-04 LAB — CBG MONITORING, ED
Glucose-Capillary: 389 mg/dL — ABNORMAL HIGH (ref 70–99)
Glucose-Capillary: 291 mg/dL — ABNORMAL HIGH (ref 70–99)

## 2024-09-04 MED ORDER — LIDOCAINE 5 % EX PTCH
1.0000 | MEDICATED_PATCH | CUTANEOUS | Status: DC
  Administered 2024-09-04: 1 via TRANSDERMAL
  Filled 2024-09-04: qty 1

## 2024-09-04 MED ORDER — LIDOCAINE 5 % EX PTCH: 1.0000 | MEDICATED_PATCH | CUTANEOUS | 0 refills | Status: AC

## 2024-09-04 MED ORDER — KETOROLAC TROMETHAMINE 15 MG/ML IJ SOLN
15.0000 mg | Freq: Once | INTRAMUSCULAR | Status: AC
  Administered 2024-09-04: 15 mg via INTRAVENOUS
  Filled 2024-09-04: qty 1

## 2024-09-04 MED ORDER — CYCLOBENZAPRINE HCL 10 MG PO TABS: 10.0000 mg | ORAL_TABLET | Freq: Two times a day (BID) | ORAL | 0 refills | Status: AC | PRN
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
